# Patient Record
Sex: Male | Born: 1935
Health system: Southern US, Community
[De-identification: ages and names within clinical notes are randomized; demographics above are authoritative.]

## PROBLEM LIST (undated history)

## (undated) DIAGNOSIS — J302 Other seasonal allergic rhinitis: Secondary | ICD-10-CM

## (undated) DIAGNOSIS — J439 Emphysema, unspecified: Secondary | ICD-10-CM

## (undated) DIAGNOSIS — K219 Gastro-esophageal reflux disease without esophagitis: Secondary | ICD-10-CM

## (undated) DIAGNOSIS — J449 Chronic obstructive pulmonary disease, unspecified: Secondary | ICD-10-CM

## (undated) HISTORY — PX: CARDIAC CATHETERIZATION: SHX172

## (undated) HISTORY — PX: PROSTATE SURGERY: SHX751

---

## 1999-01-12 ENCOUNTER — Ambulatory Visit (HOSPITAL_COMMUNITY): Admission: RE | Admit: 1999-01-12 | Discharge: 1999-01-12 | Payer: Self-pay | Admitting: Cardiology

## 1999-05-18 ENCOUNTER — Encounter: Payer: Self-pay | Admitting: Internal Medicine

## 1999-05-18 ENCOUNTER — Encounter: Admission: RE | Admit: 1999-05-18 | Discharge: 1999-05-18 | Payer: Self-pay | Admitting: Internal Medicine

## 2001-01-03 ENCOUNTER — Emergency Department (HOSPITAL_COMMUNITY): Admission: EM | Admit: 2001-01-03 | Discharge: 2001-01-03 | Payer: Self-pay | Admitting: *Deleted

## 2001-12-20 ENCOUNTER — Encounter: Admission: RE | Admit: 2001-12-20 | Discharge: 2001-12-20 | Payer: Self-pay | Admitting: Internal Medicine

## 2001-12-20 ENCOUNTER — Encounter: Payer: Self-pay | Admitting: Internal Medicine

## 2002-02-12 ENCOUNTER — Ambulatory Visit (HOSPITAL_COMMUNITY): Admission: RE | Admit: 2002-02-12 | Discharge: 2002-02-12 | Payer: Self-pay | Admitting: Gastroenterology

## 2002-02-12 ENCOUNTER — Encounter (INDEPENDENT_AMBULATORY_CARE_PROVIDER_SITE_OTHER): Payer: Self-pay | Admitting: Specialist

## 2003-03-03 ENCOUNTER — Inpatient Hospital Stay (HOSPITAL_COMMUNITY): Admission: RE | Admit: 2003-03-03 | Discharge: 2003-03-05 | Payer: Self-pay | Admitting: Urology

## 2003-09-25 ENCOUNTER — Emergency Department (HOSPITAL_COMMUNITY): Admission: EM | Admit: 2003-09-25 | Discharge: 2003-09-25 | Payer: Self-pay | Admitting: Family Medicine

## 2003-09-28 ENCOUNTER — Emergency Department (HOSPITAL_COMMUNITY): Admission: EM | Admit: 2003-09-28 | Discharge: 2003-09-28 | Payer: Self-pay | Admitting: Family Medicine

## 2003-10-03 ENCOUNTER — Emergency Department (HOSPITAL_COMMUNITY): Admission: EM | Admit: 2003-10-03 | Discharge: 2003-10-03 | Payer: Self-pay | Admitting: Family Medicine

## 2004-04-19 ENCOUNTER — Encounter (INDEPENDENT_AMBULATORY_CARE_PROVIDER_SITE_OTHER): Payer: Self-pay | Admitting: *Deleted

## 2004-04-19 ENCOUNTER — Ambulatory Visit (HOSPITAL_COMMUNITY): Admission: RE | Admit: 2004-04-19 | Discharge: 2004-04-19 | Payer: Self-pay | Admitting: Gastroenterology

## 2004-09-13 ENCOUNTER — Ambulatory Visit (HOSPITAL_COMMUNITY): Admission: RE | Admit: 2004-09-13 | Discharge: 2004-09-13 | Payer: Self-pay | Admitting: Urology

## 2006-02-06 ENCOUNTER — Ambulatory Visit: Payer: Self-pay | Admitting: Pulmonary Disease

## 2008-06-16 ENCOUNTER — Ambulatory Visit (HOSPITAL_COMMUNITY): Admission: RE | Admit: 2008-06-16 | Discharge: 2008-06-16 | Payer: Self-pay | Admitting: Internal Medicine

## 2009-04-30 ENCOUNTER — Ambulatory Visit: Payer: Self-pay | Admitting: Internal Medicine

## 2009-04-30 ENCOUNTER — Inpatient Hospital Stay (HOSPITAL_COMMUNITY): Admission: EM | Admit: 2009-04-30 | Discharge: 2009-05-01 | Payer: Self-pay | Admitting: Emergency Medicine

## 2010-04-19 NOTE — Assessment & Plan Note (Signed)
Summary: FINGER CUT ALMOST COMPLETELY OFF / LFW   History of Present Illness: Not a patient here walked in  right index finge cut to bone hanging at angle but attached no sig active bleeding  Rescue called for transport for emergency care He was going to drive himself but I explained that would be entirely inappropriate and potentially dangerous   Other Orders: No Charge Patient Arrived (NCPA0) (NCPA0)

## 2010-06-08 LAB — POCT I-STAT, CHEM 8
BUN: 16 mg/dL (ref 6–23)
Calcium, Ion: 0.96 mmol/L — ABNORMAL LOW (ref 1.12–1.32)
HCT: 45 % (ref 39.0–52.0)
Potassium: 5.5 mEq/L — ABNORMAL HIGH (ref 3.5–5.1)
Sodium: 137 mEq/L (ref 135–145)
TCO2: 27 mmol/L (ref 0–100)

## 2010-06-08 LAB — CBC
Hemoglobin: 15.1 g/dL (ref 13.0–17.0)
MCHC: 33.6 g/dL (ref 30.0–36.0)
MCV: 91.8 fL (ref 78.0–100.0)
Platelets: 215 10*3/uL (ref 150–400)
RBC: 4.88 MIL/uL (ref 4.22–5.81)

## 2010-06-08 LAB — DIFFERENTIAL
Basophils Absolute: 0 10*3/uL (ref 0.0–0.1)
Basophils Relative: 1 % (ref 0–1)
Eosinophils Relative: 4 % (ref 0–5)
Lymphocytes Relative: 17 % (ref 12–46)
Lymphs Abs: 0.9 10*3/uL (ref 0.7–4.0)
Monocytes Absolute: 0.4 10*3/uL (ref 0.1–1.0)
Monocytes Relative: 7 % (ref 3–12)
Neutro Abs: 3.9 10*3/uL (ref 1.7–7.7)
Neutrophils Relative %: 72 % (ref 43–77)

## 2010-08-05 NOTE — H&P (Signed)
NAME:  Anthony Ball, Anthony Ball                           ACCOUNT NO.:  [id number]   MEDICAL RECORD NO.:  [id number]                   PATIENT TYPE:  AMB   LOCATION:  DAY                                  FACILITY:  [facility]   PHYSICIAN:  Ky Barban, M.D.            DATE OF BIRTH:  [birth year]   DATE OF ADMISSION:  DATE OF DISCHARGE:                                HISTORY & PHYSICAL   CHIEF COMPLAINT:  Symptoms of prostatism.   HISTORY:  A 76 year old gentleman, initially seen by me on October 18, with  mild symptoms of prostatism, slow and weak stream, nocturia x 3, hesitancy,  sometimes has dysuria, no fever, chills, or gross hematuria.  He is taking  Flomax but still has symptoms.  Work-up showed that he has enlarged prostate  causing bladder neck obstruction, so I have told him to undergo TUR of  prostate.  The procedure, its complication, limitations explained.  He  understood.  We are going to go ahead and proceed.  He also complains of  pain in his right testicle, and ultrasound showed that he has right  spermatocele.  There is also one on the left side which is smaller.   PAST MEDICAL HISTORY:  1. Bilateral inguinal hernia repair.  2. Removal of nasal polyp.   FAMILY HISTORY:  No history of prostate cancer.   SOCIAL HISTORY:  Does not smoke or drink.   REVIEW OF SYSTEMS:  Unremarkable.   PHYSICAL EXAMINATION:  VITAL SIGNS:  Blood pressure 110/68, temperature  97.8.  CENTRAL NERVOUS SYSTEM:  No gross neurologic deficit.  HEAD AND NECK:  Negative.  CHEST:  Clear.  HEART:  Regular sinus rhythm.  ABDOMEN:  Soft, flat.  Liver, spleen, kidneys are not palpable.  No CVA  tenderness.  GENITOURINARY:  Uncircumcised meatus, __________ testicles feel normal.  There is a cystic swelling 3 cm in size on the right side which is slightly  tender.  There is a spermatocele  or hydrocele.  RECTAL EXAM:  Normal sphincter tone.  No rectal mass.  Prostate 1.5+,  smooth, and firm.   IMPRESSION:  1. Benign prostatic hypertrophy.  2. Small right spermatocele.   PLAN:  Transurethral resection of prostate under anesthesia as outpatient,  and then he will be admitted in the hospital.                                                Ky Barban, M.D.    MIJ/MEDQ  D:  03/02/2003  T:  03/02/2003  Job:  [id number]

## 2010-08-05 NOTE — Op Note (Signed)
   NAME:  Anthony Ball, Anthony Ball                           ACCOUNT NO.:  [id number]   MEDICAL RECORD NO.:  [id number]                   PATIENT TYPE:  AMB   LOCATION:  ENDO                                 FACILITY:  [facility]   PHYSICIAN:  Danise Edge, M.D.                DATE OF BIRTH:  [birth year]   DATE OF PROCEDURE:  02/12/2002  DATE OF DISCHARGE:                                 OPERATIVE REPORT   PROCEDURE:  Colonoscopy and polypectomy.   INDICATIONS:  The patient is a 76 year old male undergoing his first  screening colonoscopy with polypectomy to prevent colon cancer.  I discussed  with the patient the complications associated with colonoscopy and  polypectomy, including a 15 per thousand risk of bleeding and one per  thousand risk of colon perforation requiring surgery.  The patient has  signed the operative permit.   ENDOSCOPIST:  Danise Edge, M.D.   PREMEDICATION:  Versed 7.5 mg, Demerol 50 mg.   ENDOSCOPE:  Olympus pediatric colonoscope.   DESCRIPTION OF PROCEDURE:  After obtaining informed consent, the patient was  placed in the left lateral decubitus position.  I administered intravenous  Demerol and intravenous Versed to achieve conscious sedation for the  procedure.  The patient's blood pressure, oxygen saturation, and cardiac  rhythm were monitored throughout the procedure and documented in the medical  record.   Anal inspection was normal.  Digital rectal exam was normal.  The Olympus  pediatric video colonoscope was introduced into the rectum and advanced to  the cecum.  Colonic preparation for the exam today was satisfactory.   Rectum normal.   Sigmoid colon and descending colon:  Left colonic diverticulosis.  At  approximately 30-40 cm from the anal verge, three 1 mm sessile polyps were  removed with the electrocautery snare and submitted for pathologic  interpretation.   Splenic flexure normal.   Transverse colon normal.   Hepatic flexure normal.   Ascending colon normal.   Cecum and ileocecal valve normal.    ASSESSMENT:  1. Left colonic diverticulosis.  2. Three small polyps were removed from the mid-distal sigmoid colon.   RECOMMENDATIONS:  Repeat colonoscopy in approximately five years.                                               Danise Edge, M.D.    MJ/MEDQ  D:  02/12/2002  T:  02/12/2002  Job:  [id number]   cc:   Georgann Housekeeper, M.D.  [address]  [place]  [place] [address]  Fax: [phone]

## 2010-08-05 NOTE — Op Note (Signed)
NAMESISTO, GRANILLO                 ACCOUNT NO.:  [id number]   MEDICAL RECORD NO.:  [id number]          PATIENT TYPE:  AMB   LOCATION:  DAY                           FACILITY:  [facility]   PHYSICIAN:  Ky Barban, M.D.DATE OF BIRTH:  Sep 10, 1934   DATE OF PROCEDURE:  09/13/2004  DATE OF DISCHARGE:                                 OPERATIVE REPORT   PREOPERATIVE DIAGNOSIS:  Urethral stricture.   POSTOPERATIVE DIAGNOSIS:  Urethral stricture.   PROCEDURE:  Retrograde urethrogram, cysto, holmium laser ablation of the  stricture.   ANESTHESIA:  Spinal.   PROCEDURE:  The patient given spinal anesthesia. Placed in lithotomy  position. After usual prep and drape, a solution of KY jelly and Hypaque was  injected into the urethra after holding the glans penis with the Brodney  clamp. Under fluoroscopic control, I can see the dye goes into the bulbar  urethra. That is where the narrow stricture is. The dye goes through it into  the bladder. The rest of the anterior urethra looks normal. At this point,  clamp was removed, and #25 cystoscope introduced under direct vision. The  stricture is visualized, and I was able to pass #5 ________________catheter  through it. The catheter was left in place, and I removed the cystoscope,  and along side the catheter introduced short rigid laser urethroscope, and  through that, I incised the stricture at 6 and 12 o'clock positions. Then  the prostatic urethra and the bladder were inspected and looked fine.  Urethroscope was removed, and urethra further dilated to 21 [place] with [employer] sounds. A #16 silicone catheter is left in for drainage. The urethra  catheter was removed. The patient left the operating room in satisfactory  condition.       MIJ/MEDQ  D:  09/13/2004  T:  09/13/2004  Job:  [id number]

## 2010-08-05 NOTE — Assessment & Plan Note (Signed)
[facility]                             PULMONARY OFFICE NOTE   Anthony Ball, Anthony Ball                        MRN:          [id number]  DATE:02/06/2006                            DOB:          February 14, 1935    PULMONARY SELF-REFERRAL   HISTORY OF PRESENT ILLNESS:  The patient is a 76 year old white male who  comes in today for evaluation of possible emphysema.  The patient had a  recent chest x-ray with his primary care physician and told he had  changes consistent with emphysema.  He is very concerned about this  and wants to know if this is an issue that is going to be a problem for  him in later years.  The patient states that he had minimal tobacco use  during his teenage years and has not smoked since.  In terms of his  breathing status, he states that he has no difficulties walking on flat  ground at a moderate pace.  He will not get short of breath making a bed  or bringing groceries in from the car.  Overall, he has minimal dyspnea  on exertion and fairly good exercise tolerance.  He does complain of  some chronic nasal congestion at times and has a history of allergy  shots, which have helped in the past.  The patient's weight has not  changed significantly over the last few years.   PAST MEDICAL HISTORY:  1. Significant for allergic rhinitis.  2. History of sinus and colon surgery in the past.   The patient takes no medications on a regular basis and has no known  drug allergies.   SOCIAL HISTORY:  He is divorced and has children.  He currently is  retired and lives alone.   FAMILY HISTORY:  Remarkable for possible emphysema.  Otherwise, is  noncontributory.   REVIEW OF SYSTEMS:  As per history of present illness.  Also see the  patient intake form documented in the chart.   PHYSICAL EXAM:  GENERAL:  He is a well-developed white male in no acute  distress.  Blood pressure 110/54, pulse 67, temperature 98.2, weight is 145 pounds,  O2  saturation on room air is 99%.  HEENT:  Pupils are equal, round, and reactive to light and  accommodation.  Extraocular muscles are intact.  Nares show mild  deviation to the left of the septum, but patency of both nasal airways.  Oropharynx is clear.  NECK:  Supple without JVD or lymphadenopathy.  There is no palpable  thyromegaly.  CHEST:  Totally clear.  CARDIAC:  Regular rate and rhythm with a 1/6 systolic murmur.  ABDOMEN:  Soft and nontender with good bowel sounds.  GENITAL, RECTAL, AND BREASTS:  Not done and not indicated.  LOWER EXTREMITIES:  Without edema.  Pulses are intact distally.  NEUROLOGIC:  He is alert and oriented with no observable motor defects.   LABORATORY DATA:  Spirometry done today in the office was very difficult  because of the patient's problems with following directions.  His  technique was less than perfect.  His forced  vital capacity is obviously  inaccurate with it being 149% of predicted.  There is no evidence for  gross airflow obstruction by the numbers or by the flow volume loop.   IMPRESSION:  1. No evidence for clinically significant obstructive lung disease at      this time.  The patient enjoys a fairly active life-style and good      exercise tolerance.  He has no significant airflow obstruction by      spirometry.  I have tried to explain to him the difference between      mild emphysematous changes on chest x-ray and how it translates      into real-life abnormalities.  The findings on the x-ray may simply      be due to the natural effects of aging.  The patient, at this time,      is clinically asymptomatic.  2. Allergic rhinitis with chronic nasal congestion.  He may benefit      form a trial of topical nasal corticosteroids if he has not tried      these.   PLAN:  1. I have asked the patient to keep up with his flu vaccine and      Pneumovax, as well as staying on a regular exercise program.  2. No further pulmonary followup is  necessary unless he has      significant increase in symptoms.  3. Trial of Veramyst 2 sprays in each nostril daily to see if this      will help his allergic rhinitis symptoms.  4. The patient will follow up on a p.r.n. basis.     Barbaraann Share, MD,FCCP  Electronically Signed    [facility]/MedQ  DD: 02/06/2006  DT: 02/06/2006  Job #: [id number]   cc:   Georgann Housekeeper, MD

## 2010-08-05 NOTE — Op Note (Signed)
NAME:  DILLION, STOWERS NO.:  [id number]   MEDICAL RECORD NO.:  [id number]          PATIENT TYPE:  AMB   LOCATION:  ENDO                         FACILITY:  [facility]   PHYSICIAN:  Danise Edge, M.D.   DATE OF BIRTH:  Nov 23, 1934   DATE OF PROCEDURE:  04/19/2004  DATE OF DISCHARGE:                                 OPERATIVE REPORT   PROCEDURE:  Esophagogastroduodenoscopy.   INDICATIONS:  Mr. Radford Pease is a 76 year old male born 14-Oct-1934.  Mr. Kron has unexplained epigastric discomfort.   ENDOSCOPIST:  Danise Edge, M.D.   PREMEDICATION:  Versed 5 mg, Demerol 50 mg.   DESCRIPTION OF PROCEDURE:  After obtaining informed consent, Mr. Zapf was  placed on the left lateral decubitus position.  I administered intravenous  Demerol and intravenous Versed to achieve conscious sedation for the  procedure.  Mr. Febus blood pressure, oxygen saturation and cardiac rhythm  were monitored throughout the procedure and documented in the medical  record.   The Olympus gastroscope was passed through the posterior hypopharynx into  the proximal esophagus without difficulty.  The hypopharynx, larynx and  vocal cords appeared normal.   Esophagoscopy:  The proximal, mid and lower segments of the esophageal  mucosa appear normal.  There is no endoscopic evidence for the presence of  Barrett's esophagus or erosive esophagitis.   Gastroscopy:  Retroflexed view of the gastric fundus was normal.  There is a  1-2 mm white mucosal scar in the gastric cardia which was biopsied.  The  gastric body, antrum and pylorus appear normal.   Duodenoscopy:  The duodenal bulb, mid duodenum  and distal duodenum appear  normal.   ASSESSMENT:  Essentially normal esophagogastroduodenoscopy.  A small white  mucosal scar-type lesion was biopsied from the gastric cardiac.      MJ/MEDQ  D:  04/19/2004  T:  04/19/2004  Job:  [id number]   cc:   Georgann Housekeeper, MD  [address]  [place]  [place] [address]  Fax: [phone]

## 2010-08-05 NOTE — Discharge Summary (Signed)
NAME:  LUSTER, HECHLER NO.:  [id number]   MEDICAL RECORD NO.:  [id number]                   PATIENT TYPE:  INP   LOCATION:                                       FACILITY:  [facility]   PHYSICIAN:  Ky Barban, M.D.            DATE OF BIRTH:  05/19/1934   DATE OF ADMISSION:  03/03/2003  DATE OF DISCHARGE:  03/05/2003                                 DISCHARGE SUMMARY   This 76 year old gentleman is brought in as an outpatient to undergo TUR of  prostate.  He is having quite a bit of symptoms of prostatism and workup in  the office has shown that he has enlarged prostate.  I have advised him to  undergo TUR of prostate for which he was briefed; the procedure,  complications, and limitations discussed.  He agreed so he was brought as an  outpatient on December 14 to undergo TUR prostate.   HOSPITAL COURSE:  Preoperative workup:  CBC, urinalysis, MET-7, EKG were all  negative.  He was taken to the OR and TUR of prostate was done.  Postoperative course was benign.  First postop day he was afebrile.  Abdomen  was soft.  Urine was clear; so discontinued his CBA, placed him on a regular  diet.  Second postop day urine was clear.  He was afebrile, had bowel  movement.  Pathology report is back.  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  No tumor so his Foley catheter was  discontinued; he is voiding fine.  At this point he is being discharged.   FINAL DISCHARGE DIAGNOSIS:  Benign prostatic hypertrophy.   DISCHARGE CONDITION:  Improved.   DISCHARGE MEDICATIONS:  None.   DISCHARGE INSTRUCTIONS:  I will see him back in the office in 2 weeks.                                                Ky Barban, M.D.    MIJ/MEDQ  D:  03/17/2003  T:  03/17/2003  Job:  [id number]

## 2010-08-05 NOTE — H&P (Signed)
NAME:  Anthony Ball, Anthony Ball NO.:  [id number]   MEDICAL RECORD NO.:  [id number]         PATIENT TYPE:  PAMB   LOCATION:                                FACILITY:  [facility]   PHYSICIAN:  Ky Barban, M.D.    DATE OF BIRTH:   DATE OF ADMISSION:  09/13/2004  DATE OF DISCHARGE:  LH                                HISTORY & PHYSICAL   CHIEF COMPLAINT:  Difficulty to void.   HISTORY OF PRESENT ILLNESS:  A 76 year old gentleman a couple of years ago  underwent TUR of the prostate. He was doing real good.  He started to  complain of difficulty voiding, urgency, frequency. Cystoscopy was done and  he was found to have stricture and there was bulbar urethra. Trying to  dilate him, I was only able to dilate him partially.  So I advised him to  come as an outpatient so I can do an optical urethrotomy.  I have told him  about the procedure and he will have a Foley catheter when he goes home with  that.  He is coming as an outpatient tomorrow to have it done.   PAST MEDICAL HISTORY:  1.  Bilateral inguinal hernia repair.  2.  Removal of nasal polyp.  3.  Transurethral resection of the prostate a couple years ago.   SOCIAL HISTORY:  Does not smoke or drink.   REVIEW OF SYSTEMS:  Unremarkable.   PHYSICAL EXAMINATION:  VITAL SIGNS:  Blood pressure 120/80, temperature  normal.  CENTRAL NERVOUS SYSTEM:  Negative.  HEAD AND NECK:  Negative.  CHEST:  Symmetrical.  HEART:  Regular sinus rhythm.  No murmur.  ABDOMEN:  Soft, flat.  Liver, spleen, and kidneys not palpable.  No CVA  tenderness.  EXTERNAL GENITALIA:  Unremarkable.  RECTAL:  __________ normal.   IMPRESSION:  Uretheral stricture.   PLAN:  Retrograde urethrogram, cystoscopy, optical urethrotomy under  anesthesia as an outpatient.       MIJ/MEDQ  D:  09/12/2004  T:  09/12/2004  Job:  [id number]

## 2010-08-05 NOTE — H&P (Signed)
NAME:  Ball, Anthony NO.:  [id number]   MEDICAL RECORD NO.:  [id number]           PATIENT TYPE:   LOCATION:                                 FACILITY:   PHYSICIAN:  Ky Barban, M.D.    DATE OF BIRTH:   DATE OF ADMISSION:  DATE OF DISCHARGE:  LH                                HISTORY & PHYSICAL   CHIEF COMPLAINT:  Ureteral stricture.   A 76 year old gentleman a couple of years ago underwent TUR prostate for  BPH.  He was doing fine.  Lately, he was complaining the urinary stream has  slowed down.  Cystoscopy showed that he has a stricture in the bulbar  urethra.  I tried to dilate him, but unable to completely dilate him under  local anesthesia in the office, so I decided to go ahead and do an optical  urethrotomy under anesthesia in the hospital.   PROCEDURE:  The limitations, complications as described.  He understands.  He is come in as an outpatient and will undergo the procedure as an  outpatient.   PAST MEDICAL HISTORY:  1.  TUR prostate, 2004.  2.  Bilateral inguinal hernia repair.  3.  Removal of nasal polyp.   FAMILY HISTORY:  No history of prostate cancer.   PERSONAL HISTORY:  Does not smoke or drink.   REVIEW OF SYSTEMS:  Unremarkable.   PHYSICAL EXAMINATION:  GENERAL:  A well-developed, well-nourished in no  acute distress.  VITAL SIGNS:  Blood pressure 120/70, temperature is normal.  CENTRAL NERVOUS SYSTEM:  Negative.  HEAD AND NECK:  ENT negative.  CHEST:  Heart regular sinus rhythm.  ABDOMEN:  Soft, flat.  Liver, spleen, kidneys not felt.  There was no CVA  tenderness.  EXTERNAL GENITALIA:  Circumcised male.  Testicles are normal.  RECTAL:  Deferred.  EXTREMITIES:  Normal.   IMPRESSION:  Urethral stricture.   PLAN:  Optical urethrotomy under anesthesia as an outpatient.       MIJ/MEDQ  D:  09/05/2004  T:  09/05/2004  Job:  [id number]

## 2010-08-05 NOTE — Op Note (Signed)
NAME:  Anthony Ball, Anthony Ball                           ACCOUNT NO.:  [id number]   MEDICAL RECORD NO.:  [id number]                   PATIENT TYPE:  INP   LOCATION:  A220                                 FACILITY:  [facility]   PHYSICIAN:  Ky Barban, M.D.            DATE OF BIRTH:  1935-02-17   DATE OF PROCEDURE:  03/03/2003  DATE OF DISCHARGE:                                 OPERATIVE REPORT   PREOPERATIVE DIAGNOSIS:  Benign prostatic hypertrophy.   POSTOPERATIVE DIAGNOSIS:  Benign prostatic hypertrophy.   PROCEDURE:  Transurethral resection of prostate.   SURGEON:  Ky Barban, M.D.   ANESTHESIA:  Spinal.   DESCRIPTION OF PROCEDURE:  The patient was given spinal anesthesia, placed  in the lithotomy position, appropriately prepped and draped.  A #28 Iglesias  resectoscope was introduced into the bladder.  The bladder was inspected.  He has a rather large median lobe.  The resectoscope was pulled back in the  mid prostatic urethra.  The median lobe was resected completely to the level  of the verumontanum.  Now the bladder neck was circumferentially dissected.  The bleeders were coagulated.  A resectoscope was pulled back at the level  of the verumontanum and rotated to the 11 o'clock position.  A dissection of  the right lobe was done between the 11 and 7 o'clock position.  Similarly,  the left lobe was dissected between the 1 and 5 o'clock positions.  There  was a small amount of fissure in the anterior midline.  Most of the  __________, the chips were evacuated.  Bleeders were coagulated.  The  prostatic urethra tunnel looks wide open.  The resectoscope was then  removed, and a 23 Foley catheter was left in for drainage which is clear.  The patient left the operating room in satisfactory condition.                                               Ky Barban, M.D.    MIJ/MEDQ  D:  03/03/2003  T:  03/03/2003  Job:  [id number]

## 2011-08-24 ENCOUNTER — Other Ambulatory Visit: Payer: Self-pay | Admitting: Gastroenterology

## 2012-04-13 ENCOUNTER — Emergency Department (HOSPITAL_COMMUNITY)
Admission: EM | Admit: 2012-04-13 | Discharge: 2012-04-13 | Disposition: A | Discharge status: Home / Self Care | Payer: Medicare Other | Attending: Emergency Medicine | Admitting: Emergency Medicine

## 2012-04-13 ENCOUNTER — Emergency Department (HOSPITAL_COMMUNITY): Payer: Medicare Other

## 2012-04-13 ENCOUNTER — Encounter (HOSPITAL_COMMUNITY): Payer: Self-pay | Admitting: *Deleted

## 2012-04-13 DIAGNOSIS — J4489 Other specified chronic obstructive pulmonary disease: Secondary | ICD-10-CM | POA: Insufficient documentation

## 2012-04-13 DIAGNOSIS — R0602 Shortness of breath: Secondary | ICD-10-CM | POA: Insufficient documentation

## 2012-04-13 DIAGNOSIS — J449 Chronic obstructive pulmonary disease, unspecified: Secondary | ICD-10-CM | POA: Insufficient documentation

## 2012-04-13 DIAGNOSIS — Z79899 Other long term (current) drug therapy: Secondary | ICD-10-CM | POA: Insufficient documentation

## 2012-04-13 HISTORY — DX: Chronic obstructive pulmonary disease, unspecified: J44.9

## 2012-04-13 LAB — CBC WITH DIFFERENTIAL/PLATELET
Basophils Absolute: 0 10*3/uL (ref 0.0–0.1)
MCHC: 33.6 g/dL (ref 30.0–36.0)
MCV: 89.4 fL (ref 78.0–100.0)
Monocytes Absolute: 0.4 10*3/uL (ref 0.1–1.0)
Neutro Abs: 2.2 10*3/uL (ref 1.7–7.7)
Neutrophils Relative %: 59 % (ref 43–77)

## 2012-04-13 LAB — BASIC METABOLIC PANEL
BUN: 14 mg/dL (ref 6–23)
CO2: 24 mEq/L (ref 19–32)
Calcium: 9.1 mg/dL (ref 8.4–10.5)
Chloride: 104 mEq/L (ref 96–112)
Creatinine, Ser: 0.76 mg/dL (ref 0.50–1.35)
GFR calc non Af Amer: 87 mL/min — ABNORMAL LOW (ref >90)
Glucose, Bld: 82 mg/dL (ref 70–99)
Potassium: 4.5 mEq/L (ref 3.5–5.1)
Sodium: 137 mEq/L (ref 135–145)

## 2012-04-13 LAB — TROPONIN I: Troponin I: <0.3 ng/mL (ref <0.30)

## 2012-04-13 MED ORDER — ALBUTEROL SULFATE HFA 108 (90 BASE) MCG/ACT IN AERS: 2.0000 | INHALATION_SPRAY | RESPIRATORY_TRACT | Status: DC | PRN

## 2012-04-13 NOTE — ED Provider Notes (Signed)
History     CSN: [id number]  Arrival date & time 04/13/12  1111   First MD Initiated Contact with Patient 04/13/12 1126      Chief Complaint  Patient presents with  . Shortness of Breath    (Consider location/radiation/quality/duration/timing/severity/associated sxs/prior treatment) HPI Comments: 78 year old male who has a history of very few medical problems who presents with shortness of breath. He states that the shortness of breath has been slightly worsening over the last 2 days, slightly more at night. He states that he maybe has occasional abnormal feeling in his chest but denies upright chest pain or palpitations. He denies swelling in his legs, denies orthopnea, denies paroxysmal nocturnal dyspnea. He has been evaluated by pulmonology approximately 6 years ago and was found to have no evidence of COPD and at that time no further workup was indicated. The patient seems to think that he does have some emphysema though he has not smoked since he was a teenager. The patient denies any coughing, fever, chills, nausea, vomiting or any other complaints. He states that his symptoms are mild and they have gradually been improving this morning. He does not use oxygen at home and has no dyspnea on exertion.  Patient is a 78 y.o. male presenting with shortness of breath. The history is provided by the patient and medical records.  Shortness of Breath  Associated symptoms include shortness of breath.    Past Medical History  Diagnosis Date  . COPD (chronic obstructive pulmonary disease)     No past surgical history on file.  No family history on file.  History  Substance Use Topics  . Smoking status: Never Smoker   . Smokeless tobacco: Not on file  . Alcohol Use: No      Review of Systems  Respiratory: Positive for shortness of breath.   All other systems reviewed and are negative.    Allergies  Review of patient's allergies indicates no known allergies.  Home Medications    Current Outpatient Rx  Name  Route  Sig  Dispense  Refill  . LORATADINE 10 MG PO TABS   Oral   Take 10 mg by mouth daily.         Marland Kitchen OMEPRAZOLE 20 MG PO CPDR   Oral   Take 20 mg by mouth 2 (two) times daily.          . TRIAMCINOLONE ACETONIDE 0.1 % EX CREA   Topical   Apply 1 application topically every other day. Apply to great areas of body         . ALBUTEROL SULFATE HFA 108 (90 BASE) MCG/ACT IN AERS   Inhalation   Inhale 2 puffs into the lungs every 4 (four) hours as needed for wheezing or shortness of breath.   1 Inhaler   3     BP 102/64  Pulse 67  Temp 97.2 F (36.2 C) (Oral)  Resp 17  SpO2 97%  Physical Exam  Nursing note and vitals reviewed. Constitutional: He appears well-developed and well-nourished. No distress.  HENT:  Head: Normocephalic and atraumatic.  Mouth/Throat: Oropharynx is clear and moist. No oropharyngeal exudate.       Oropharynx is clear, mucous membranes are moist  Eyes: Conjunctivae normal and EOM are normal. Pupils are equal, round, and reactive to light. Right eye exhibits no discharge. Left eye exhibits no discharge. No scleral icterus.  Neck: Normal range of motion. Neck supple. No JVD present. No thyromegaly present.  Cardiovascular: Normal rate, regular rhythm,  normal heart sounds and intact distal pulses.  Exam reveals no gallop and no friction rub.   No murmur heard.      Normal pulses at the radial arteries bilaterally, no signs of JVD  Pulmonary/Chest: Effort normal and breath sounds normal. No respiratory distress. He has no wheezes. He has no rales.       Lung exam with no abnormal sounds, totally clear, no respiratory distress, no increased work of breathing, speaks in full sentences, no wheezing rales or rhonchi.  Abdominal: Soft. Bowel sounds are normal. He exhibits no distension and no mass. There is no tenderness.  Musculoskeletal: Normal range of motion. He exhibits no edema and no tenderness.       No peripheral  edema, normal range of motion of lower extremities at the hips knees and ankles  Lymphadenopathy:    He has no cervical adenopathy.  Neurological: He is alert. Coordination normal.       Sensation intact to the bilateral lower and upper extremities to light touch. Gait is steady  Skin: Skin is warm and dry. No rash noted. No erythema.  Psychiatric: He has a normal mood and affect. His behavior is normal.    ED Course  Procedures (including critical care time)  Labs Reviewed  CBC WITH DIFFERENTIAL - Abnormal; Notable for the following:    WBC 3.7 (*)     All other components within normal limits  BASIC METABOLIC PANEL - Abnormal; Notable for the following:    GFR calc non Af Amer 87 (*)     All other components within normal limits  TROPONIN I  LAB REPORT - SCANNED   Dg Chest 2 View  04/13/2012  *RADIOLOGY REPORT*  Clinical Data: COPD exacerbation with shortness of breath.  CHEST - 2 VIEW  Comparison: Portable chest x-ray 04/30/2009.  Findings: Cardiomediastinal silhouette unremarkable, unchanged. Mild hyperinflation and mildly prominent bronchovascular markings diffusely, unchanged.  Nipple shadows project over the lung bases on the PA image, as before.  Lungs clear.  Bronchovascular markings normal.  Pulmonary vascularity normal.  No pneumothorax.  No pleural effusions.  IMPRESSION: Mild hyperinflation consistent with COPD and/or asthma.  No acute cardiopulmonary disease.  Nipple shadows project over the lung bases, as before.   Original Report Authenticated By: Hulan Saas, M.D.      1. Shortness of breath       MDM  The etiology of the patient's shortness of breath is unclear on his physical exam as he does appear well without any abnormal findings. We'll proceed with EKG and chest x-ray with some laboratory values to further evaluate.  Filed Vitals:   04/13/12 1550  BP: 102/64  Pulse: 67  Temp:   Resp: 17    ED ECG REPORT  I personally interpreted this EKG   Date:  04/15/2012   Rate: 81  Rhythm: normal sinus rhythm  QRS Axis: normal  Intervals: normal  ST/T Wave abnormalities: normal  Conduction Disutrbances:none  Narrative Interpretation:   Old EKG Reviewed: Compared with the more 11th 2011, no significant changes  Pt feels much better - he has received no medicines - has normal labs and ECG and has clear lungs on the CXR.  Normal trophonin - offered pt MDI for home - accepted, normal sat's, and repeat exam has normal lung sounds - pt can be reffered back to PMD Dr. Eula Listen.  PA and lateral views of the chest were obtained by digital radiography. I have personally interpreted these x-rays and find her  to be no signs of pulmonary infiltrate, cardiomegaly, subdiaphragmatic free air, soft tissue abnormality, no obvious bony abnormalities or fractures.  There is some hyperexpansion of the lungs.  Pt has clear lungs on reexamination and states that he has been asymptomatic throughout his stay in the ED.  Labs reviewed, VS remain normal, pt stable for d/c.      Vida Roller, MD 04/15/12 1130

## 2012-04-13 NOTE — ED Notes (Signed)
Patient transported to X-ray 

## 2012-04-13 NOTE — ED Notes (Signed)
Has history of COPD, last two nights increased difficulty breathing due to [employer]

## 2012-04-13 NOTE — ED Notes (Signed)
Rn to obtain labs with start of IV 

## 2012-07-21 ENCOUNTER — Emergency Department (HOSPITAL_COMMUNITY): Payer: Medicare Other

## 2012-07-21 ENCOUNTER — Emergency Department (HOSPITAL_COMMUNITY)
Admission: EM | Admit: 2012-07-21 | Discharge: 2012-07-21 | Disposition: A | Discharge status: Home / Self Care | Payer: Medicare Other | Attending: Emergency Medicine | Admitting: Emergency Medicine

## 2012-07-21 ENCOUNTER — Encounter (HOSPITAL_COMMUNITY): Payer: Self-pay

## 2012-07-21 DIAGNOSIS — M25559 Pain in unspecified hip: Secondary | ICD-10-CM | POA: Insufficient documentation

## 2012-07-21 DIAGNOSIS — Z79899 Other long term (current) drug therapy: Secondary | ICD-10-CM | POA: Insufficient documentation

## 2012-07-21 DIAGNOSIS — J438 Other emphysema: Secondary | ICD-10-CM | POA: Insufficient documentation

## 2012-07-21 DIAGNOSIS — Z9889 Other specified postprocedural states: Secondary | ICD-10-CM | POA: Insufficient documentation

## 2012-07-21 DIAGNOSIS — K219 Gastro-esophageal reflux disease without esophagitis: Secondary | ICD-10-CM | POA: Insufficient documentation

## 2012-07-21 DIAGNOSIS — M25551 Pain in right hip: Secondary | ICD-10-CM

## 2012-07-21 HISTORY — DX: Gastro-esophageal reflux disease without esophagitis: K21.9

## 2012-07-21 HISTORY — DX: Other seasonal allergic rhinitis: J30.2

## 2012-07-21 HISTORY — DX: Emphysema, unspecified: J43.9

## 2012-07-21 MED ORDER — ONDANSETRON 4 MG PO TBDP
4.0000 mg | ORAL_TABLET | Freq: Once | ORAL | Status: AC
  Administered 2012-07-21: 4 mg via ORAL
  Filled 2012-07-21: qty 1

## 2012-07-21 MED ORDER — HYDROCODONE-ACETAMINOPHEN 5-325 MG PO TABS: ORAL_TABLET | ORAL | Status: DC

## 2012-07-21 MED ORDER — MORPHINE SULFATE 4 MG/ML IJ SOLN: 4.0000 mg | Freq: Once | INTRAMUSCULAR | Status: DC

## 2012-07-21 MED ORDER — MORPHINE SULFATE 4 MG/ML IJ SOLN
4.0000 mg | Freq: Once | INTRAMUSCULAR | Status: AC
  Administered 2012-07-21: 4 mg via INTRAMUSCULAR
  Filled 2012-07-21: qty 1

## 2012-07-21 NOTE — ED Provider Notes (Signed)
History     CSN: [id number]  Arrival date & time 07/21/12  1005   First MD Initiated Contact with Patient 07/21/12 1024      Chief Complaint  Patient presents with  . Hip Pain    (Consider location/radiation/quality/duration/timing/severity/associated sxs/prior treatment) HPI  Anthony Ball is a 78 y.o. male complaining of left hip pain onset yesterday when patient was lifting heavy pieces of metal junk. Patient states the pain has become more severe increasing to 9/10. Patient is accompanied by sister Anthony Ball who states that he crawled on his hands and knees to go to the bathroom last night. He is walking with a cane, he normally and lives independently. Patient states that the pain is centered at the left hip it initially radiated down to the foot, but this resolved. Patient denies any numbness paresthesia, change in bowel or bladder habits, head trauma, neck pain, chest pain, shortness of breath, abdominal pain.    Past Medical History  Diagnosis Date  . COPD (chronic obstructive pulmonary disease)   . Emphysema   . Seasonal allergies   . GERD (gastroesophageal reflux disease)     Past Surgical History  Procedure Laterality Date  . Prostate surgery    . Cardiac catheterization      No family history on file.  History  Substance Use Topics  . Smoking status: Never Smoker   . Smokeless tobacco: Not on file  . Alcohol Use: No      Review of Systems  Constitutional: Negative for fever.  Respiratory: Negative for shortness of breath.   Cardiovascular: Negative for chest pain.  Gastrointestinal: Negative for nausea, vomiting, abdominal pain and diarrhea.  Musculoskeletal: Positive for arthralgias.  All other systems reviewed and are negative.    Allergies  Review of patient's allergies indicates no known allergies.  Home Medications   Current Outpatient Rx  Name  Route  Sig  Dispense  Refill  . albuterol (PROVENTIL HFA;VENTOLIN HFA) 108 (90 BASE) MCG/ACT  inhaler   Inhalation   Inhale 2 puffs into the lungs every 4 (four) hours as needed for wheezing or shortness of breath.   1 Inhaler   3   . omeprazole (PRILOSEC) 20 MG capsule   Oral   Take 20 mg by mouth 2 (two) times daily.          Marland Kitchen triamcinolone cream (KENALOG) 0.1 %   Topical   Apply 1 application topically every other day. Apply to great areas of body           BP 115/66  Pulse 77  Temp(Src) 97.4 F (36.3 C) (Oral)  Resp 18  SpO2 96%  Physical Exam  Nursing note and vitals reviewed. Constitutional: He is oriented to person, place, and time. He appears well-developed and well-nourished. No distress.  HENT:  Head: Normocephalic.  Eyes: Conjunctivae and EOM are normal. Pupils are equal, round, and reactive to light.  Cardiovascular: Normal rate.   Pulmonary/Chest: Effort normal. No stridor.  Musculoskeletal: Normal range of motion.  Legs equal length, no abnormal rotation, patient is able to lift left and right leg up off the bed to greater than 45. Distal sensation is grossly intact, straight leg raise is negative bilaterally, patient is mildly tender to palpation at the right greater trochanter with no erythema, warmth or skin changes.  Neurological: He is alert and oriented to person, place, and time.  Psychiatric: He has a normal mood and affect.    ED Course  Procedures (including critical  care time)  Labs Reviewed - No data to display Dg Hip Complete Right  07/21/2012  *RADIOLOGY REPORT*  Clinical Data: Right hip pain, lifting injury  RIGHT HIP - COMPLETE 2+ VIEW  Comparison: None.  Findings: Bones appear osteopenic.  Degenerative changes of the lower lumbar spine with mild scoliosis.  Bony pelvis appears intact.  Hips are symmetric.  Right hip appears located without acute displaced fracture.  No subluxation or dislocation.  IMPRESSION: Osteopenia without acute fracture by plain radiography.   Original Report Authenticated By: Judie Petit. Shick, M.D.      1.  Arthralgia of hip, right       MDM   Anthony Ball is a 78 y.o. male x-ray shows no fracture, it is consistent with osteopenia. I will treat his pain and advised him to rest and follow with orthopedist if the pain persists.   Filed Vitals:   07/21/12 1011  BP: 115/66  Pulse: 77  Temp: 97.4 F (36.3 C)  TempSrc: Oral  Resp: 18  SpO2: 96%     VSS and patient is appropriate for, and amenable to, discharge at this time. Pt verbalized understanding and agrees with care plan. Outpatient follow-up and return precautions given.    New Prescriptions   HYDROCODONE-ACETAMINOPHEN (NORCO/VICODIN) 5-325 MG PER TABLET    Take 1-2 tablets by mouth every 6 hours as needed for pain.           Wynetta Emery, PA-C 07/21/12 1528

## 2012-07-21 NOTE — ED Notes (Signed)
Pt states he was moving some stuff around yesterday and the motion of lifting, twisting and bending is making his right hip and leg hurt.

## 2012-07-21 NOTE — ED Provider Notes (Signed)
Medical screening examination/treatment/procedure(s) were performed by non-physician practitioner and as supervising physician I was immediately available for consultation/collaboration.    Vanden Fawaz R Breasia Karges, MD 07/21/12 1554 

## 2014-06-26 ENCOUNTER — Other Ambulatory Visit: Payer: Self-pay | Admitting: Internal Medicine

## 2014-06-26 ENCOUNTER — Ambulatory Visit
Admission: RE | Admit: 2014-06-26 | Discharge: 2014-06-26 | Disposition: A | Discharge status: Home / Self Care | Payer: PPO | Source: Ambulatory Visit | Attending: Internal Medicine | Admitting: Internal Medicine

## 2014-06-26 DIAGNOSIS — M25511 Pain in right shoulder: Secondary | ICD-10-CM

## 2014-06-26 DIAGNOSIS — M25512 Pain in left shoulder: Principal | ICD-10-CM

## 2014-08-06 ENCOUNTER — Ambulatory Visit: Payer: PPO | Attending: Internal Medicine | Admitting: Physical Therapy

## 2014-08-06 DIAGNOSIS — M25512 Pain in left shoulder: Secondary | ICD-10-CM | POA: Diagnosis not present

## 2014-08-06 DIAGNOSIS — M25511 Pain in right shoulder: Secondary | ICD-10-CM | POA: Insufficient documentation

## 2014-08-06 DIAGNOSIS — R29898 Other symptoms and signs involving the musculoskeletal system: Secondary | ICD-10-CM | POA: Diagnosis not present

## 2014-08-06 DIAGNOSIS — M25612 Stiffness of left shoulder, not elsewhere classified: Secondary | ICD-10-CM

## 2014-08-06 DIAGNOSIS — M25611 Stiffness of right shoulder, not elsewhere classified: Secondary | ICD-10-CM

## 2014-08-06 NOTE — Therapy (Signed)
[facility] [address] [place], [place], [address] Phone: [phone]   Fax:  [phone]  Physical Therapy Evaluation  Patient Details  Name: Anthony Ball MRN: [id number] Date of Birth: [birth year] Referring Provider:  Georgann Housekeeper, MD  Encounter Date: 08/06/2014      PT End of Session - 08/06/14 1814    Visit Number 1   Number of Visits 12   Date for PT Re-Evaluation 09/17/14   PT Start Time 1500   PT Stop Time 1545   PT Time Calculation (min) 45 min   Activity Tolerance Patient tolerated treatment well   Behavior During Therapy [facility] for tasks assessed/performed      Past Medical History  Diagnosis Date  . COPD (chronic obstructive pulmonary disease)   . Emphysema   . Seasonal allergies   . GERD (gastroesophageal reflux disease)     Past Surgical History  Procedure Laterality Date  . Prostate surgery    . Cardiac catheterization      There were no vitals filed for this visit.  Visit Diagnosis:  Bilateral shoulder pain - Plan: PT plan of care cert/re-cert  Bilateral arm weakness - Plan: PT plan of care cert/re-cert  Decreased ROM of left shoulder - Plan: PT plan of care cert/re-cert  Decreased ROM of right shoulder - Plan: PT plan of care cert/re-cert      Subjective Assessment - 08/06/14 1510    Subjective pt is a 80 y.o M with CC of bil shoulder pain that started a couple months ago insidously. He reports that he was reaching really far to scartch his shoulders was when he first noticed it being sore. He reports problems with reaching and lifting   Limitations Lifting;House hold activities  carrying.   Diagnostic tests 06/26/2014 negative impresison    Patient Stated Goals to get use out of the arm, to be pain free   Currently in Pain? Yes   Pain Score 3    Pain Location Shoulder   Pain Orientation Right;Left   Pain Descriptors / Indicators Tightness;Sore   Pain Type Chronic pain   Pain Onset More  than a month ago   Pain Frequency Constant   Aggravating Factors  lifting, carrying, laying down. (difficulty remember what it feels like or what makes it worse.    Pain Relieving Factors moving it around/eating, applying motion             [facility] PT Assessment - 08/06/14 0001    Assessment   Medical Diagnosis bil shoulder pain   Onset Date --  a couple months ago   Next MD Visit --  august 2016   Prior Therapy yes   Precautions   Precautions None   Restrictions   Weight Bearing Restrictions No   Balance Screen   Has the patient fallen in the past 6 months No   Has the patient had a decrease in activity level because of a fear of falling?  No   Is the patient reluctant to leave their home because of a fear of falling?  No   Home Environment   Living Enviornment Private residence   Living Arrangements Alone   Type of Home House   Home Access Stairs to enter   Entrance Stairs-Number of Steps 5   Entrance Stairs-Rails Can reach both   Home Layout One level   Prior Function   Level of Independence Independent with basic ADLs;Independent with homemaking with ambulation;Independent with transfers;Independent with gait  Vocation Retired   GafferVocation Requirements retired   Leisure difficult remember, being Armed forces training and education officersocial   Cognition   Overall Cognitive Status Within Functional Limits for tasks assessed   Memory Impaired   Memory Impairment --  difficult remember details and things   Observation/Other Assessments   Focus on Therapeutic Outcomes (FOTO)  39% limitatin   predicted 32% limitatoin   Posture/Postural Control   Posture/Postural Control Postural limitations   Postural Limitations Rounded Shoulders;Forward head;Flexed trunk   ROM / Strength   AROM / PROM / Strength AROM;PROM;Strength   AROM   AROM Assessment Site Shoulder   Right/Left Shoulder Right;Left   Right Shoulder Flexion 110 Degrees  pain during motion   Right Shoulder ABduction 112 Degrees   Right Shoulder  External Rotation 78 Degrees   Right Shoulder Horizontal ABduction 90 Degrees   Left Shoulder Flexion 110 Degrees  pain during motion   Left Shoulder ABduction 104 Degrees   Left Shoulder Internal Rotation 40 Degrees   Left Shoulder External Rotation 50 Degrees   PROM   PROM Assessment Site Shoulder   Right/Left Shoulder Right;Left   Right Shoulder Flexion 162 Degrees   Right Shoulder ABduction 168 Degrees   Right Shoulder Internal Rotation 82 Degrees   Right Shoulder External Rotation 80 Degrees   Left Shoulder Flexion 158 Degrees   Left Shoulder ABduction 143 Degrees   Left Shoulder Internal Rotation 82 Degrees   Left Shoulder External Rotation 80 Degrees   Strength   Strength Assessment Site Shoulder   Right/Left Shoulder Right;Left   Right Shoulder Flexion 4-/5   Right Shoulder Extension 4-/5   Right Shoulder ABduction 4-/5   Right Shoulder Internal Rotation 4-/5   Right Shoulder External Rotation 4-/5   Left Shoulder Flexion 4-/5   Left Shoulder Extension 4-/5   Left Shoulder ABduction 4-/5   Left Shoulder Internal Rotation --  4-/5   Left Shoulder External Rotation 4-/5   Special Tests    Special Tests Rotator Cuff Impingement   Rotator Cuff Impingment tests Neer impingement test;Hawkins- Kennedy test;Empty Can test;Full Can test;Painful Arc of Motion   Neer Impingement test    Findings Negative   Hawkins-Kennedy test   Findings Positive   Empty Can test   Findings Negative   Full Can test   Findings Negative   Painful Arc of Motion   Findings Positive                   [facility] Adult PT Treatment/Exercise - 08/06/14 0001    Shoulder Exercises: Standing   External Rotation AROM;Strengthening;Both;15 reps;Theraband   Theraband Level (Shoulder External Rotation) Level 2 (Red)   Internal Rotation AROM;Strengthening;Both;15 reps;Theraband   Theraband Level (Shoulder Internal Rotation) Level 2 (Red)   Shoulder Exercises: ROM/Strengthening   Other  ROM/Strengthening Exercises wand flexion and extension 2 x 10 flexion/ abduction  VC to slow down and proper form                PT Education - 08/06/14 1814    Education provided Yes   Education Details evaluation findings, POC, goals, and HEP.   Person(s) Educated Patient   Methods Explanation   Comprehension Verbalized understanding          PT Short Term Goals - 08/06/14 1822    PT SHORT TERM GOAL #1   Title pt will be I with basic HEP (08/27/2014)   Time 3   Period Weeks   Status New   PT SHORT TERM GOAL #  2   Title pt will increase FOTO score by > 10 points to assist with funcitonal capacity (08/27/2014)   Time 3   Period Weeks   Status New   PT SHORT TERM GOAL #3   Title --           PT Long Term Goals - 08/06/14 1827    PT LONG TERM GOAL #1   Title he will be I with advanced HEP 09/17/14   Time 6   Period Weeks   Status New   PT LONG TERM GOAL #2   Title pt will increase bil shoulder strength to > 4+/5 to assist with ADLs 09/17/14   Time 6   Period Weeks   Status New   PT LONG TERM GOAL #3   Title pt will demonstrate <2/10 pain during and after lifting > 5-10# overhead to assist with functional lifting 09/17/14   Time 6   Period Weeks   Status New   PT LONG TERM GOAL #4   Title He will Increase his FOTO score to atleast 68 upon discharge to assist with functional capacity.    Time 6   Period Weeks   Status New   PT LONG TERM GOAL #5   Title pt will be able to verbalize and demonstrate techniques to reduce risk or bil shoulder reinjury via postural awareness, lifting and carry mechanics, and HEP 09/17/14   Time 6   Period Weeks   Status New               Plan - 08/06/14 1815    Clinical Impression Statement Henreitta CeaGarry presents to OPPT with CC of bil shoulder pain. He demonstrates lmited shoulder AROM with pain noted at endranges. He demonstrates functional strength with no pain during testing. During exercises today pt demonstrates impulsive  behavior and goes overboard with acitvities requiring VC to go within painless range to help facilitate proper exercise progression. during evaluation he demonstrate difficulty with memory retrieval taking increased time and exhibiting diffifuclty explaining.  He would benefit from skilled physical therapy to maximze his funciton by addressing the impairments listed.    Pt will benefit from skilled therapeutic intervention in order to improve on the following deficits Pain;Decreased activity tolerance;Decreased endurance;Decreased range of motion;Decreased strength;Increased muscle spasms;Impaired UE functional use;Postural dysfunction;Improper body mechanics;Decreased knowledge of use of DME;Impaired flexibility;Decreased safety awareness   Rehab Potential Good   PT Frequency 2x / week   PT Duration 8 weeks   PT Treatment/Interventions ADLs/Self Care Home Management;Electrical Stimulation;Cryotherapy;Ultrasound;Moist Heat;Therapeutic activities;Therapeutic exercise;Neuromuscular re-education;Manual techniques;Patient/family education;Dry needling;Passive range of motion   PT Next Visit Plan assess response to HEP, modalites PRN, shoulder mobility, and strengthening   PT Home Exercise Plan See HEP handout   Consulted and Agree with Plan of Care Patient          G-Codes - 08/06/14 1831    Functional Assessment Tool Used FOTO 39% limitation    Functional Limitation Carrying, moving and handling objects   Carrying, Moving and Handling Objects Current Status ([id number]) At least 20 percent but less than 40 percent impaired, limited or restricted   Carrying, Moving and Handling Objects Goal Status ([id number]) At least 1 percent but less than 20 percent impaired, limited or restricted       Problem List There are no active problems to display for this patient.  Lulu RidingKristoffer Deloria Brassfield PT, DPT, LAT, ATC  08/06/2014  6:35 PM    [facility] [address]  [place], [place], [address] Phone: [phone]   Fax:  [phone]

## 2014-08-06 NOTE — Patient Instructions (Signed)
   Scott Fix PT, DPT, LAT, ATC  [facility] Phone: [phone]     

## 2014-08-25 ENCOUNTER — Ambulatory Visit: Payer: PPO | Attending: Internal Medicine | Admitting: Physical Therapy

## 2014-08-25 DIAGNOSIS — R29898 Other symptoms and signs involving the musculoskeletal system: Secondary | ICD-10-CM | POA: Diagnosis present

## 2014-08-25 DIAGNOSIS — M25511 Pain in right shoulder: Secondary | ICD-10-CM | POA: Diagnosis present

## 2014-08-25 DIAGNOSIS — M7582 Other shoulder lesions, left shoulder: Secondary | ICD-10-CM | POA: Diagnosis present

## 2014-08-25 DIAGNOSIS — M25612 Stiffness of left shoulder, not elsewhere classified: Secondary | ICD-10-CM

## 2014-08-25 DIAGNOSIS — M25512 Pain in left shoulder: Secondary | ICD-10-CM | POA: Insufficient documentation

## 2014-08-25 DIAGNOSIS — M25611 Stiffness of right shoulder, not elsewhere classified: Secondary | ICD-10-CM

## 2014-08-25 NOTE — Therapy (Signed)
[id number] [facility] [address] [place], [place], [address] Phone: [phone]   Fax:  [phone]  Physical Therapy Treatment  Patient Details  Name: Anthony LutesGarry L Ball MRN: [id number] Date of Birth: May 10, 1934 Referring Provider:  Georgann HousekeeperHusain, Karrar, MD  Encounter Date: 08/25/2014      PT End of Session - 08/25/14 1108    Visit Number 2   Number of Visits 12   Date for PT Re-Evaluation 09/17/14   PT Start Time 1105   PT Stop Time 1145   PT Time Calculation (min) 40 min      Past Medical History  Diagnosis Date  . COPD (chronic obstructive pulmonary disease)   . Emphysema   . Seasonal allergies   . GERD (gastroesophageal reflux disease)     Past Surgical History  Procedure Laterality Date  . Prostate surgery    . Cardiac catheterization      There were no vitals filed for this visit.  Visit Diagnosis:  Bilateral shoulder pain  Bilateral arm weakness  Decreased ROM of left shoulder  Decreased ROM of right shoulder      Subjective Assessment - 08/25/14 1106    Subjective I take aleve at night. I am limiting myself on how many I take. It seems to iritate my throat and mouth. If I dont take anything my pain will increase at night or the next morning.    Currently in Pain? No/denies   Pain Score --  maybe a 1/10   Aggravating Factors  worse at night and early morning.    Pain Relieving Factors feels better during the day.                          [facility] Adult PT Treatment/Exercise - 08/25/14 1135    Shoulder Exercises: Supine   Protraction Both;20 reps   Protraction Limitations tactile and verbal cues for technique   Flexion Both;10 reps;Weights   Shoulder Flexion Weight (lbs) 2# punches   Other Supine Exercises cane press up and pullovers x 20 added 2 # and repeated a second set each   Shoulder Exercises: Standing   External Rotation 15 reps;Theraband   Theraband Level (Shoulder External Rotation)  Level 2 (Red)   Internal Rotation 15 reps;Theraband   Theraband Level (Shoulder Internal Rotation) Level 2 (Red)   Extension Both;15 reps;Theraband   Theraband Level (Shoulder Extension) Level 2 (Red)   Row Strengthening;Both;15 reps;Theraband   Theraband Level (Shoulder Row) Level 2 (Red)   Shoulder Exercises: Pulleys   Flexion 2 minutes   ABduction 1 minute   Shoulder Exercises: ROM/Strengthening   UBE (Upper Arm Bike) L2 3 min for 2 mi back cues for posture                PT Education - 08/25/14 1152    Education provided Yes   Education Details Red band row and extension   Person(s) Educated Patient   Methods Explanation;Handout   Comprehension Verbalized understanding          PT Short Term Goals - 08/06/14 1822    PT SHORT TERM GOAL #1   Title pt will be I with basic HEP (08/27/2014)   Time 3   Period Weeks   Status New   PT SHORT TERM GOAL #2   Title pt will increase FOTO score by > 10 points to assist with funcitonal capacity (08/27/2014)   Time 3   Period Weeks   Status  New   PT SHORT TERM GOAL #3   Title --           PT Long Term Goals - 08/06/14 1827    PT LONG TERM GOAL #1   Title he will be I with advanced HEP 09/17/14   Time 6   Period Weeks   Status New   PT LONG TERM GOAL #2   Title pt will increase bil shoulder strength to > 4+/5 to assist with ADLs 09/17/14   Time 6   Period Weeks   Status New   PT LONG TERM GOAL #3   Title pt will demonstrate <2/10 pain during and after lifting > 5-10# overhead to assist with functional lifting 09/17/14   Time 6   Period Weeks   Status New   PT LONG TERM GOAL #4   Title He will Increase his FOTO score to atleast 68 upon discharge to assist with functional capacity.    Time 6   Period Weeks   Status New   PT LONG TERM GOAL #5   Title pt will be able to verbalize and demonstrate techniques to reduce risk or bil shoulder reinjury via postural awareness, lifting and carry mechanics, and HEP 09/17/14    Time 6   Period Weeks   Status New               Plan - 08/25/14 1109    Clinical Impression Statement Pt reports he lifted a mechanic jack 50lbs and it caused minimal pain. His pain is noticed  mostly at night and first thing in the morning. He requires verabal and tactile cues with HEP. Progressing toward HEP/strength goals. No increased pain post treatment.   PT Next Visit Plan continue strength and mobility        Problem List There are no active problems to display for this patient.   Sherrie Mustache, [place] 08/25/2014, 11:55 AM  [facility] [address] [place], [place], [address] Phone: [phone]   Fax:  [phone]

## 2014-08-25 NOTE — Patient Instructions (Signed)
EXTENSION: Standing - Resistance Band: Stable (Active)   Stand, right arm at side. Against yellow resistance band, draw arm backward, as far as possible, keeping elbow straight. Complete _2__ sets of __10_ repetitions. Perform _2__ sessions per day.   Resistive Band Rowing   With resistive band anchored in door, grasp both ends. Keeping elbows bent, pull back, squeezing shoulder blades together. Hold __5__ seconds. Repeat __10__ times. 2 sets.  Do _2___ sessions per day.  http://gt2.exer.us/97   Copyright  VHI. All rights reserved.

## 2014-08-28 ENCOUNTER — Ambulatory Visit: Payer: PPO | Admitting: Physical Therapy

## 2014-09-01 ENCOUNTER — Ambulatory Visit: Payer: PPO | Admitting: Physical Therapy

## 2014-09-01 DIAGNOSIS — R29898 Other symptoms and signs involving the musculoskeletal system: Secondary | ICD-10-CM

## 2014-09-01 DIAGNOSIS — M25612 Stiffness of left shoulder, not elsewhere classified: Secondary | ICD-10-CM

## 2014-09-01 DIAGNOSIS — M25511 Pain in right shoulder: Secondary | ICD-10-CM | POA: Diagnosis not present

## 2014-09-01 DIAGNOSIS — M25512 Pain in left shoulder: Principal | ICD-10-CM

## 2014-09-01 DIAGNOSIS — M25611 Stiffness of right shoulder, not elsewhere classified: Secondary | ICD-10-CM

## 2014-09-01 NOTE — Therapy (Signed)
[facility] [address] [place], [place], [address] Phone: [phone]   Fax:  [phone]  Physical Therapy Treatment  Patient Details  Name: Anthony Ball MRN: [id number] Date of Birth: 1934/11/29 Referring Provider:  Georgann Housekeeper, MD  Encounter Date: 09/01/2014      PT End of Session - 09/01/14 1125    Visit Number 3   Number of Visits 12   Date for PT Re-Evaluation 09/17/14   PT Start Time 1109   PT Stop Time 1147   PT Time Calculation (min) 38 min      Past Medical History  Diagnosis Date  . COPD (chronic obstructive pulmonary disease)   . Emphysema   . Seasonal allergies   . GERD (gastroesophageal reflux disease)     Past Surgical History  Procedure Laterality Date  . Prostate surgery    . Cardiac catheterization      There were no vitals filed for this visit.  Visit Diagnosis:  Bilateral shoulder pain  Bilateral arm weakness  Decreased ROM of left shoulder  Decreased ROM of right shoulder      Subjective Assessment - 09/01/14 1115    Subjective Sometimes I think I am getting better but I woke up this morning hurting more left shoulder than right shoulder.    Currently in Pain? No/denies   Aggravating Factors  hurts more in the mornings   Pain Relieving Factors feels better during the day            [facility] PT Assessment - 09/01/14 1129    AROM   Right/Left Shoulder Right;Left   Right Shoulder Flexion 138 Degrees   Right Shoulder ABduction 136 Degrees   Right Shoulder Internal Rotation 60 Degrees   Right Shoulder External Rotation 70 Degrees   Left Shoulder Flexion 138 Degrees   Left Shoulder ABduction 136 Degrees   Left Shoulder Internal Rotation 80 Degrees  reach to T-10   Left Shoulder External Rotation 50 Degrees  reach to T-2                     [facility] Adult PT Treatment/Exercise - 09/01/14 1111    Shoulder Exercises: Supine   Horizontal ABduction Both;20  reps;Theraband   Theraband Level (Shoulder Horizontal ABduction) Level 2 (Red)   Horizontal ABduction Limitations also diagonals with red band x 10 each  pt given handles to attach to bands to decrease thumb pain   Shoulder Exercises: Standing   External Rotation Both;15 reps;Theraband   Theraband Level (Shoulder External Rotation) Level 3 (Green)   Internal Rotation Both;15 reps;Theraband   Theraband Level (Shoulder Internal Rotation) Level 3 (Green)   Extension Strengthening;Both;15 reps;Theraband   Theraband Level (Shoulder Extension) Level 3 (Green)   Row Strengthening;15 reps   Theraband Level (Shoulder Row) Level 3 (Green)   Shoulder Exercises: ROM/Strengthening   UBE (Upper Arm Bike) level 1 3 min for, 3 min back                  PT Short Term Goals - 09/01/14 1125    PT SHORT TERM GOAL #1   Title pt will be I with basic HEP (08/27/2014)   Time 3   Period Weeks   Status Achieved   PT SHORT TERM GOAL #2   Title pt will increase FOTO score by > 10 points to assist with funcitonal capacity (08/27/2014)   Time 3   Period Weeks   Status On-going  PT Long Term Goals - 09/01/14 1126    PT LONG TERM GOAL #1   Title he will be I with advanced HEP 09/17/14   Time 6   Period Weeks   Status On-going   PT LONG TERM GOAL #2   Title pt will increase bil shoulder strength to > 4+/5 to assist with ADLs 09/17/14   Time 6   Period Weeks   Status On-going   PT LONG TERM GOAL #3   Title pt will demonstrate <2/10 pain during and after lifting > 5-10# overhead to assist with functional lifting 09/17/14   Time 6   Period Weeks   Status On-going   PT LONG TERM GOAL #4   Title He will Increase his FOTO score to atleast 68 upon discharge to assist with functional capacity.    Time 6   Period Weeks   Status On-going   PT LONG TERM GOAL #5   Title pt will be able to verbalize and demonstrate techniques to reduce risk or bil shoulder reinjury via postural awareness,  lifting and carry mechanics, and HEP 09/17/14   Time 6   Period Weeks   Status On-going               Plan - 09/01/14 1259    Clinical Impression Statement Pt reports less pain at night and continued morning pain. His ROM has improved especially overhead ROM. Focus today on strengthening.   PT Next Visit Plan continue strength and mobility        Problem List There are no active problems to display for this patient.   Sherrie Mustache, [place] 09/01/2014, 1:00 PM  [facility] [address] [place], [place], [address] Phone: [phone]   Fax:  [phone]

## 2014-09-04 ENCOUNTER — Ambulatory Visit: Payer: PPO | Admitting: Physical Therapy

## 2014-09-08 ENCOUNTER — Ambulatory Visit: Payer: PPO | Admitting: Physical Therapy

## 2014-09-08 DIAGNOSIS — M25612 Stiffness of left shoulder, not elsewhere classified: Secondary | ICD-10-CM

## 2014-09-08 DIAGNOSIS — M25511 Pain in right shoulder: Secondary | ICD-10-CM

## 2014-09-08 DIAGNOSIS — R29898 Other symptoms and signs involving the musculoskeletal system: Secondary | ICD-10-CM

## 2014-09-08 DIAGNOSIS — M25611 Stiffness of right shoulder, not elsewhere classified: Secondary | ICD-10-CM

## 2014-09-08 DIAGNOSIS — M25512 Pain in left shoulder: Principal | ICD-10-CM

## 2014-09-08 NOTE — Therapy (Signed)
[facility] [address] [place], [place], [address] Phone: [phone]   Fax:  [phone]  Physical Therapy Treatment  Patient Details  Name: Anthony Ball MRN: [id number] Date of Birth: 05-16-1934 Referring Provider:  Georgann Housekeeper, MD  Encounter Date: 09/08/2014      PT End of Session - 09/08/14 1232    Visit Number 4   Number of Visits 12   Date for PT Re-Evaluation 09/17/14   PT Start Time 1145   PT Stop Time 1230   PT Time Calculation (min) 45 min   Activity Tolerance Patient tolerated treatment well   Behavior During Therapy [facility] for tasks assessed/performed      Past Medical History  Diagnosis Date  . COPD (chronic obstructive pulmonary disease)   . Emphysema   . Seasonal allergies   . GERD (gastroesophageal reflux disease)     Past Surgical History  Procedure Laterality Date  . Prostate surgery    . Cardiac catheterization      There were no vitals filed for this visit.  Visit Diagnosis:  Bilateral shoulder pain  Bilateral arm weakness  Decreased ROM of left shoulder  Decreased ROM of right shoulder      Subjective Assessment - 09/08/14 1153    Subjective "I still have some tightness in the shouder with occasional pain when I first wake up" he reports trying to being consistent with most of his HEP but forgets to do some of them.    Currently in Pain? No/denies   Pain Score 0-No pain   Pain Location Shoulder   Pain Orientation Left;Right            [facility] PT Assessment - 09/08/14 0001    AROM   Right Shoulder Flexion 140 Degrees   Right Shoulder ABduction 132 Degrees   Right Shoulder Internal Rotation --  t10   Right Shoulder External Rotation --  T-2   Left Shoulder Flexion 140 Degrees   Left Shoulder ABduction 140 Degrees   Left Shoulder Internal Rotation --  t10   Left Shoulder External Rotation --  T-2                     [facility] Adult PT Treatment/Exercise -  09/08/14 0001    Shoulder Exercises: Supine   Protraction Both;10 reps  2# x 2 sets   Horizontal ABduction Both;20 reps;Theraband  VC to keep arms horizontal   Theraband Level (Shoulder Horizontal ABduction) Level 3 (Green)   Flexion Both;10 reps;Theraband  scaption   Other Supine Exercises diagonals with red theraband bil 2 x 10 ea.   Shoulder Exercises: Standing   External Rotation Both;15 reps;Theraband   Theraband Level (Shoulder External Rotation) Level 3 (Green)   Internal Rotation Both;15 reps;Theraband   Theraband Level (Shoulder Internal Rotation) Level 3 (Green)   Extension Strengthening;Both;15 reps;Theraband   Theraband Level (Shoulder Extension) Level 3 (Green)   Row Strengthening;15 reps   Theraband Level (Shoulder Row) Level 3 (Green)   Other Standing Exercises bicep curls 1 x 10   5#   Shoulder Exercises: Pulleys   Flexion 2 minutes   ABduction 1 minute   Shoulder Exercises: ROM/Strengthening   Other ROM/Strengthening Exercises wand flexion and extension 2 x 10 flexion/ abduction  in supine                PT Education - 09/08/14 1232    Education provided Yes   Education Details added scaptoin exercises, and serratus  punch in supine   Person(s) Educated Patient   Methods Explanation   Comprehension Verbalized understanding;Returned demonstration          PT Short Term Goals - 09/01/14 1125    PT SHORT TERM GOAL #1   Title pt will be I with basic HEP (08/27/2014)   Time 3   Period Weeks   Status Achieved   PT SHORT TERM GOAL #2   Title pt will increase FOTO score by > 10 points to assist with funcitonal capacity (08/27/2014)   Time 3   Period Weeks   Status On-going           PT Long Term Goals - 09/01/14 1126    PT LONG TERM GOAL #1   Title he will be I with advanced HEP 09/17/14   Time 6   Period Weeks   Status On-going   PT LONG TERM GOAL #2   Title pt will increase bil shoulder strength to > 4+/5 to assist with ADLs 09/17/14    Time 6   Period Weeks   Status On-going   PT LONG TERM GOAL #3   Title pt will demonstrate <2/10 pain during and after lifting > 5-10# overhead to assist with functional lifting 09/17/14   Time 6   Period Weeks   Status On-going   PT LONG TERM GOAL #4   Title He will Increase his FOTO score to atleast 68 upon discharge to assist with functional capacity.    Time 6   Period Weeks   Status On-going   PT LONG TERM GOAL #5   Title pt will be able to verbalize and demonstrate techniques to reduce risk or bil shoulder reinjury via postural awareness, lifting and carry mechanics, and HEP 09/17/14   Time 6   Period Weeks   Status On-going               Plan - 09/08/14 1233    Clinical Impression Statement Court presents to therapy with report that he is decreasing his shoulder pain but continues to demonstrate pain in the shoulder and stiffness in the AM. He tolerated all exercises well today but reqiures VC to slow down and perform exercises slow and controlled.    PT Next Visit Plan continue strength and mobility   PT Home Exercise Plan added scaptoin exercises, and serratus punch in supine   Consulted and Agree with Plan of Care Patient        Problem List There are no active problems to display for this patient.  Lulu Riding PT, DPT, LAT, ATC  09/08/2014  12:35 PM    [facility] [address] [place], [place], [address] Phone: [phone]   Fax:  [phone]

## 2014-09-08 NOTE — Patient Instructions (Signed)
   Lauriel Helin PT, DPT, LAT, ATC  [facility] Phone: [phone]     

## 2014-09-11 ENCOUNTER — Ambulatory Visit: Payer: PPO | Admitting: Physical Therapy

## 2014-09-15 ENCOUNTER — Ambulatory Visit: Payer: PPO | Admitting: Physical Therapy

## 2014-09-15 DIAGNOSIS — M25511 Pain in right shoulder: Secondary | ICD-10-CM | POA: Diagnosis not present

## 2014-09-15 DIAGNOSIS — R29898 Other symptoms and signs involving the musculoskeletal system: Secondary | ICD-10-CM

## 2014-09-15 DIAGNOSIS — M25512 Pain in left shoulder: Principal | ICD-10-CM

## 2014-09-15 DIAGNOSIS — M25611 Stiffness of right shoulder, not elsewhere classified: Secondary | ICD-10-CM

## 2014-09-15 DIAGNOSIS — M25612 Stiffness of left shoulder, not elsewhere classified: Secondary | ICD-10-CM

## 2014-09-15 NOTE — Therapy (Signed)
Anthony Ball, [place], [address] Phone: [phone]   Fax:  [phone]  Physical Therapy Treatment / Re-evaluation  Patient Details  Name: Anthony Ball MRN: [id number] Date of Birth: 04-12-1934 Referring Provider:  Wenda Low, MD  Encounter Date: 09/15/2014      PT End of Session - 09/15/14 1129    Visit Number 5   Number of Visits 12   Date for PT Re-Evaluation 10/06/14   PT Start Time 1100   PT Stop Time 1145   PT Time Calculation (min) 45 min   Activity Tolerance Patient tolerated treatment well   Behavior During Therapy [facility] for tasks assessed/performed      Past Medical History  Diagnosis Date  . COPD (chronic obstructive pulmonary disease)   . Emphysema   . Seasonal allergies   . GERD (gastroesophageal reflux disease)     Past Surgical History  Procedure Laterality Date  . Prostate surgery    . Cardiac catheterization      There were no vitals filed for this visit.  Visit Diagnosis:  Bilateral shoulder pain - Plan: PT plan of care cert/re-cert  Bilateral arm weakness - Plan: PT plan of care cert/re-cert  Decreased ROM of left shoulder - Plan: PT plan of care cert/re-cert  Decreased ROM of right shoulder - Plan: PT plan of care cert/re-cert      Subjective Assessment - 09/15/14 1106    Subjective "I have been keeping up with my exercises and can tell I believe that it is getting a little better but want to be sure"   Currently in Pain? No/denies   Pain Score 0-No pain   Pain Location Shoulder   Pain Orientation Left;Right            [facility] PT Assessment - 09/15/14 0001    Assessment   Medical Diagnosis bil shoulder pain   Onset Date/Surgical Date --  couple months ago   Prior Therapy yes   Precautions   Precautions None   Restrictions   Weight Bearing Restrictions No   Balance Screen   Has the patient fallen in the past 6 months No   Has the patient had a  decrease in activity level because of a fear of falling?  No   Is the patient reluctant to leave their home because of a fear of falling?  No   Home Environment   Living Environment Private residence   Living Arrangements Alone   Type of [place] to enter   Entrance Stairs-Number of Steps 5   Entrance Stairs-Rails Can reach both   [place] One level   Prior Function   Level of Independence Independent with basic ADLs;Independent with homemaking with ambulation;Independent with transfers;Independent with gait   Vocation Retired   [employer] retired   Leisure difficult remember, being social   Cognition   Overall Cognitive Status Within Functional Limits for tasks assessed   Observation/Other Assessments   Focus on Therapeutic Outcomes (FOTO)  31% limitation   AROM   Right Shoulder Flexion 140 Degrees   Right Shoulder ABduction 140 Degrees   Right Shoulder Internal Rotation 58 Degrees   Right Shoulder External Rotation 64 Degrees   Left Shoulder Flexion 140 Degrees   Left Shoulder ABduction 152 Degrees   Left Shoulder Internal Rotation 58 Degrees   Left Shoulder External Rotation 62 Degrees   Strength   Right Shoulder Flexion 4/5   Right Shoulder  Extension 4/5   Right Shoulder ABduction 4/5   Right Shoulder Internal Rotation 4/5   Left Shoulder Flexion 4/5   Left Shoulder Extension 4/5   Left Shoulder Internal Rotation 4/5   Left Shoulder External Rotation 4/5                     [facility] Adult PT Treatment/Exercise - 09/15/14 1107    Shoulder Exercises: Supine   Protraction Both;15 reps  5#   Horizontal ABduction Both;20 reps;Theraband   Theraband Level (Shoulder Horizontal ABduction) Level 3 (Green)   Flexion Both;10 reps;Theraband   Flexion Limitations Scaption   Shoulder Exercises: Standing   External Rotation Both;15 reps;Theraband   Theraband Level (Shoulder External Rotation) Level 3 (Green)   Internal Rotation  Both;15 reps;Theraband   Theraband Level (Shoulder Internal Rotation) Level 3 (Green)   Extension Strengthening;Both;15 reps;Theraband   Theraband Level (Shoulder Extension) Level 3 (Green)   Row Strengthening;15 reps   Theraband Level (Shoulder Row) Level 3 (Green)   Other Standing Exercises bicep curls 1 x 10    Other Standing Exercises D1 /D2 x 10 ea   with red theraband   Shoulder Exercises: ROM/Strengthening   UBE (Upper Arm Bike) level 1 3 min for, 3 min back   Other ROM/Strengthening Exercises wand flexion and extension 2 x 10 flexion/ abduction  with 4#                PT Education - 09/15/14 1151    Education provided Yes   Education Details HEP review, update POC to 1 x week for 3 weeks to assist with transition to Indepednetn exercise.    Person(s) Educated Patient   Methods Explanation   Comprehension Verbalized understanding          PT Short Term Goals - 09/15/14 1252    PT SHORT TERM GOAL #1   Title pt will be I with basic HEP (08/27/2014)   Time 3   Period Weeks   Status Achieved   PT SHORT TERM GOAL #2   Title pt will increase FOTO score by > 10 points to assist with funcitonal capacity (08/27/2014)   Time 3   Period Weeks   Status Partially Met           PT Long Term Goals - 09/15/14 1253    PT LONG TERM GOAL #1   Title he will be I with advanced HEP 09/17/14   Time 6   Period Weeks   Status On-going   PT LONG TERM GOAL #2   Title pt will increase bil shoulder strength to > 4+/5 to assist with ADLs 09/17/14   Time 6   Period Weeks   Status On-going   PT LONG TERM GOAL #3   Title pt will demonstrate <2/10 pain during and after lifting > 5-10# overhead to assist with functional lifting 09/17/14   Time 6   Period Weeks   Status On-going   PT LONG TERM GOAL #4   Title He will Increase his FOTO score to atleast 68 upon discharge to assist with functional capacity.    Time 6   Period Weeks   Status Achieved   PT LONG TERM GOAL #5   Title  pt will be able to verbalize and demonstrate techniques to reduce risk or bil shoulder reinjury via postural awareness, lifting and carry mechanics, and HEP 09/17/14   Time 6   Period Weeks   Status On-going  Plan - October 09, 2014 1250    Clinical Impression Statement Anthony Ball continues to make great progress with increase shoulder mobility and strength. He met STG 1 and partially met STG 2, and met LTG #3. He continues to have some pain at endrange abduction but reports it isn't as bad as it used to be. When asked  about pain he reported he believes its getting better but was unsure about the progress.  Plan to continue wiht POC for 1 x a week for 3 weeks to help transition to independent exercises.    Pt will benefit from skilled therapeutic intervention in order to improve on the following deficits Pain;Decreased activity tolerance;Decreased endurance;Decreased range of motion;Decreased strength;Increased muscle spasms;Impaired UE functional use;Postural dysfunction;Improper body mechanics;Decreased knowledge of use of DME;Impaired flexibility;Decreased safety awareness   Rehab Potential Good   PT Frequency 1x / week   PT Duration 3 weeks   PT Treatment/Interventions ADLs/Self Care Home Management;Electrical Stimulation;Cryotherapy;Ultrasound;Moist Heat;Therapeutic activities;Therapeutic exercise;Neuromuscular re-education;Manual techniques;Patient/family education;Dry needling;Passive range of motion   PT Next Visit Plan continue strength and mobility   PT Home Exercise Plan HEP review   Consulted and Agree with Plan of Care Patient          G-Codes - 10/09/14 1151    Functional Assessment Tool Used 31% limitation   Functional Limitation Carrying, moving and handling objects   Carrying, Moving and Handling Objects Current Status (H4742) At least 20 percent but less than 40 percent impaired, limited or restricted   Carrying, Moving and Handling Objects Goal Status (V9563) At  least 1 percent but less than 20 percent impaired, limited or restricted      Problem List There are no active problems to display for this patient.  Starr Lake PT, DPT, LAT, ATC  Oct 09, 2014  1:00 PM    [facility] [address] [place], [place], [address] Phone: [phone]   Fax:  [phone]

## 2014-09-18 ENCOUNTER — Ambulatory Visit: Payer: PPO | Attending: Internal Medicine | Admitting: Physical Therapy

## 2014-09-18 DIAGNOSIS — M7582 Other shoulder lesions, left shoulder: Secondary | ICD-10-CM | POA: Insufficient documentation

## 2014-09-18 DIAGNOSIS — M25511 Pain in right shoulder: Secondary | ICD-10-CM | POA: Insufficient documentation

## 2014-09-18 DIAGNOSIS — M25512 Pain in left shoulder: Secondary | ICD-10-CM | POA: Insufficient documentation

## 2014-09-18 DIAGNOSIS — R29898 Other symptoms and signs involving the musculoskeletal system: Secondary | ICD-10-CM | POA: Insufficient documentation

## 2014-09-29 ENCOUNTER — Ambulatory Visit: Payer: PPO | Admitting: Physical Therapy

## 2014-09-29 DIAGNOSIS — M25612 Stiffness of left shoulder, not elsewhere classified: Secondary | ICD-10-CM

## 2014-09-29 DIAGNOSIS — M25512 Pain in left shoulder: Secondary | ICD-10-CM | POA: Diagnosis present

## 2014-09-29 DIAGNOSIS — R29898 Other symptoms and signs involving the musculoskeletal system: Secondary | ICD-10-CM | POA: Diagnosis present

## 2014-09-29 DIAGNOSIS — M25511 Pain in right shoulder: Secondary | ICD-10-CM | POA: Diagnosis not present

## 2014-09-29 DIAGNOSIS — M25611 Stiffness of right shoulder, not elsewhere classified: Secondary | ICD-10-CM

## 2014-09-29 DIAGNOSIS — M7582 Other shoulder lesions, left shoulder: Secondary | ICD-10-CM | POA: Diagnosis present

## 2014-09-29 NOTE — Patient Instructions (Signed)
   Trudi Morgenthaler PT, DPT, LAT, ATC  [facility] Phone: [phone]     

## 2014-09-29 NOTE — Therapy (Addendum)
[place], [place], [address] Phone: [phone]   Fax:  [phone]  Physical Therapy Treatment  Patient Details  Name: Anthony Ball MRN: [id number] Date of Birth: 11-17-1934 Referring Provider:  Wenda Low, MD  Encounter Date: 09/29/2014      PT End of Session - 09/29/14 1229    Visit Number 6   Number of Visits 12   Date for PT Re-Evaluation 10/06/14   PT Start Time 8366   PT Stop Time 1230   PT Time Calculation (min) 45 min   Activity Tolerance Patient tolerated treatment well   Behavior During Therapy [facility] for tasks assessed/performed      Past Medical History  Diagnosis Date  . COPD (chronic obstructive pulmonary disease)   . Emphysema   . Seasonal allergies   . GERD (gastroesophageal reflux disease)     Past Surgical History  Procedure Laterality Date  . Prostate surgery    . Cardiac catheterization      There were no vitals filed for this visit.  Visit Diagnosis:  Bilateral shoulder pain  Bilateral arm weakness  Decreased ROM of left shoulder  Decreased ROM of right shoulder      Subjective Assessment - 09/29/14 1154    Subjective "I had some pain in the shoulder this morning but that could be due to me sleeping on it, right now I don't have any pain"   Currently in Pain? No/denies   Pain Score 0-No pain                         [facility] Adult PT Treatment/Exercise - 09/29/14 0001    Shoulder Exercises: Supine   Protraction Both;15 reps  6#   Horizontal ABduction Both;Theraband;12 reps   Theraband Level (Shoulder Horizontal ABduction) Level 4 (Blue)   Flexion Both;10 reps;Theraband   Theraband Level (Shoulder Flexion) Level 3 (Green)   Flexion Limitations Scaption   Other Supine Exercises diagonals with red theraband bil 2 x 10 ea.   Shoulder Exercises: Standing   External Rotation Both;15 reps;Theraband   Theraband Level (Shoulder External  Rotation) Level 4 (Blue)   Internal Rotation Both;15 reps;Theraband   Theraband Level (Shoulder Internal Rotation) Level 4 (Blue)   Extension Strengthening;Both;15 reps;Theraband   Theraband Level (Shoulder Extension) Level 4 (Blue)   Row Strengthening;15 reps   Theraband Level (Shoulder Row) Level 4 (Blue)   Other Standing Exercises bicep curls 2 x 10   6#   Shoulder Exercises: ROM/Strengthening   UBE (Upper Arm Bike) level 1.5 3 min for, 3 min back                PT Education - 09/29/14 1229    Education provided Yes   Education Details updated HEP   Person(s) Educated Patient   Methods Explanation   Comprehension Verbalized understanding          PT Short Term Goals - 09/15/14 1252    PT SHORT TERM GOAL #1   Title pt will be I with basic HEP (08/27/2014)   Time 3   Period Weeks   Status Achieved   PT SHORT TERM GOAL #2   Title pt will increase FOTO score by > 10 points to assist with funcitonal capacity (08/27/2014)   Time 3   Period Weeks   Status Partially Met           PT Long Term Goals - 09/15/14 1253  PT LONG TERM GOAL #1   Title he will be I with advanced HEP 09/17/14   Time 6   Period Weeks   Status On-going   PT LONG TERM GOAL #2   Title pt will increase bil shoulder strength to > 4+/5 to assist with ADLs 09/17/14   Time 6   Period Weeks   Status On-going   PT LONG TERM GOAL #3   Title pt will demonstrate <2/10 pain during and after lifting > 5-10# overhead to assist with functional lifting 09/17/14   Time 6   Period Weeks   Status On-going   PT LONG TERM GOAL #4   Title He will Increase his FOTO score to atleast 68 upon discharge to assist with functional capacity.    Time 6   Period Weeks   Status Achieved   PT LONG TERM GOAL #5   Title pt will be able to verbalize and demonstrate techniques to reduce risk or bil shoulder reinjury via postural awareness, lifting and carry mechanics, and HEP 09/17/14   Time 6   Period Weeks   Status  On-going               Plan - 09/29/14 1230    Clinical Impression Statement Hoy Morn tolerated treament well today with progression of exercises using heavier therband and increasing reps. He reports doing his HEP at home consistently. Plan to possibly discharge next visit.    PT Next Visit Plan FOTO, Goals, HEP, possibly discharge   PT Home Exercise Plan wall push-ups   Consulted and Agree with Plan of Care Patient        Problem List There are no active problems to display for this patient.  Starr Lake PT, DPT, LAT, ATC  09/29/2014  12:33 PM      [facility] [address] [place], [place], [address] Phone: [phone]   Fax:  [phone]                      PHYSICAL THERAPY DISCHARGE SUMMARY  Visits from Start of Care: 6  Current functional level related to goals / functional outcomes: FOTO 31% limited    Remaining deficits: See goals   Education / Equipment: HEP  Plan:                                                    Patient goals were partially met. Patient is being discharged due to not returning since the last visit.  ?????          Sidney Kann PT, DPT, LAT, ATC  11/26/2014  2:14 PM

## 2014-10-06 ENCOUNTER — Ambulatory Visit: Payer: PPO | Admitting: Physical Therapy

## 2014-10-13 ENCOUNTER — Telehealth: Payer: Self-pay | Admitting: Physical Therapy

## 2014-10-13 ENCOUNTER — Ambulatory Visit: Payer: PPO | Admitting: Physical Therapy

## 2014-10-13 NOTE — Telephone Encounter (Signed)
Left message regarding that he missed his appointment today at 11am and that he has an appointment at 11:45 on 10/20/2014. If he had any questions to call back.

## 2014-10-20 ENCOUNTER — Telehealth: Payer: Self-pay | Admitting: Physical Therapy

## 2014-10-20 ENCOUNTER — Ambulatory Visit: Payer: PPO | Attending: Internal Medicine | Admitting: Physical Therapy

## 2014-10-20 NOTE — Telephone Encounter (Signed)
Left message regarding missing today's appointment and that he had no more scheduled appointments. Asked if he could call back and let us know if he doesn't require any more physical therapy and we can discharge him, but if he feels like he needs to continues to schedule an appointment.

## 2015-04-16 DIAGNOSIS — E039 Hypothyroidism, unspecified: Secondary | ICD-10-CM | POA: Diagnosis not present

## 2015-04-16 DIAGNOSIS — Z23 Encounter for immunization: Secondary | ICD-10-CM | POA: Diagnosis not present

## 2015-04-16 DIAGNOSIS — D72819 Decreased white blood cell count, unspecified: Secondary | ICD-10-CM | POA: Diagnosis not present

## 2015-04-16 DIAGNOSIS — N400 Enlarged prostate without lower urinary tract symptoms: Secondary | ICD-10-CM | POA: Diagnosis not present

## 2015-04-16 DIAGNOSIS — F419 Anxiety disorder, unspecified: Secondary | ICD-10-CM | POA: Diagnosis not present

## 2015-04-16 DIAGNOSIS — R413 Other amnesia: Secondary | ICD-10-CM | POA: Diagnosis not present

## 2015-04-16 DIAGNOSIS — Z1389 Encounter for screening for other disorder: Secondary | ICD-10-CM | POA: Diagnosis not present

## 2015-04-16 DIAGNOSIS — J449 Chronic obstructive pulmonary disease, unspecified: Secondary | ICD-10-CM | POA: Diagnosis not present

## 2015-04-16 DIAGNOSIS — M1990 Unspecified osteoarthritis, unspecified site: Secondary | ICD-10-CM | POA: Diagnosis not present

## 2015-04-16 DIAGNOSIS — Z0000 Encounter for general adult medical examination without abnormal findings: Secondary | ICD-10-CM | POA: Diagnosis not present

## 2015-04-16 DIAGNOSIS — K219 Gastro-esophageal reflux disease without esophagitis: Secondary | ICD-10-CM | POA: Diagnosis not present

## 2015-06-16 DIAGNOSIS — R413 Other amnesia: Secondary | ICD-10-CM | POA: Diagnosis not present

## 2015-06-16 DIAGNOSIS — K589 Irritable bowel syndrome without diarrhea: Secondary | ICD-10-CM | POA: Diagnosis not present

## 2015-10-25 DIAGNOSIS — K219 Gastro-esophageal reflux disease without esophagitis: Secondary | ICD-10-CM | POA: Diagnosis not present

## 2015-10-25 DIAGNOSIS — E039 Hypothyroidism, unspecified: Secondary | ICD-10-CM | POA: Diagnosis not present

## 2015-10-25 DIAGNOSIS — R413 Other amnesia: Secondary | ICD-10-CM | POA: Diagnosis not present

## 2016-03-31 DIAGNOSIS — F419 Anxiety disorder, unspecified: Secondary | ICD-10-CM | POA: Diagnosis not present

## 2016-03-31 DIAGNOSIS — R413 Other amnesia: Secondary | ICD-10-CM | POA: Diagnosis not present

## 2016-03-31 DIAGNOSIS — J449 Chronic obstructive pulmonary disease, unspecified: Secondary | ICD-10-CM | POA: Diagnosis not present

## 2016-03-31 DIAGNOSIS — Z23 Encounter for immunization: Secondary | ICD-10-CM | POA: Diagnosis not present

## 2016-03-31 DIAGNOSIS — K219 Gastro-esophageal reflux disease without esophagitis: Secondary | ICD-10-CM | POA: Diagnosis not present

## 2016-03-31 DIAGNOSIS — N400 Enlarged prostate without lower urinary tract symptoms: Secondary | ICD-10-CM | POA: Diagnosis not present

## 2016-03-31 DIAGNOSIS — E039 Hypothyroidism, unspecified: Secondary | ICD-10-CM | POA: Diagnosis not present

## 2016-03-31 DIAGNOSIS — D72819 Decreased white blood cell count, unspecified: Secondary | ICD-10-CM | POA: Diagnosis not present

## 2016-03-31 DIAGNOSIS — J309 Allergic rhinitis, unspecified: Secondary | ICD-10-CM | POA: Diagnosis not present

## 2016-03-31 DIAGNOSIS — M1990 Unspecified osteoarthritis, unspecified site: Secondary | ICD-10-CM | POA: Diagnosis not present

## 2016-06-29 DIAGNOSIS — Z1389 Encounter for screening for other disorder: Secondary | ICD-10-CM | POA: Diagnosis not present

## 2016-06-29 DIAGNOSIS — R413 Other amnesia: Secondary | ICD-10-CM | POA: Diagnosis not present

## 2016-06-29 DIAGNOSIS — E039 Hypothyroidism, unspecified: Secondary | ICD-10-CM | POA: Diagnosis not present

## 2016-06-29 DIAGNOSIS — M1990 Unspecified osteoarthritis, unspecified site: Secondary | ICD-10-CM | POA: Diagnosis not present

## 2016-06-29 DIAGNOSIS — N400 Enlarged prostate without lower urinary tract symptoms: Secondary | ICD-10-CM | POA: Diagnosis not present

## 2016-06-29 DIAGNOSIS — J449 Chronic obstructive pulmonary disease, unspecified: Secondary | ICD-10-CM | POA: Diagnosis not present

## 2016-06-29 DIAGNOSIS — K219 Gastro-esophageal reflux disease without esophagitis: Secondary | ICD-10-CM | POA: Diagnosis not present

## 2016-11-09 DIAGNOSIS — H04123 Dry eye syndrome of bilateral lacrimal glands: Secondary | ICD-10-CM | POA: Diagnosis not present

## 2016-11-09 DIAGNOSIS — H2513 Age-related nuclear cataract, bilateral: Secondary | ICD-10-CM | POA: Diagnosis not present

## 2016-11-09 DIAGNOSIS — H40012 Open angle with borderline findings, low risk, left eye: Secondary | ICD-10-CM | POA: Diagnosis not present

## 2016-11-09 DIAGNOSIS — H524 Presbyopia: Secondary | ICD-10-CM | POA: Diagnosis not present

## 2016-11-09 DIAGNOSIS — H40002 Preglaucoma, unspecified, left eye: Secondary | ICD-10-CM | POA: Diagnosis not present

## 2016-11-29 DIAGNOSIS — B351 Tinea unguium: Secondary | ICD-10-CM | POA: Diagnosis not present

## 2016-11-29 DIAGNOSIS — R413 Other amnesia: Secondary | ICD-10-CM | POA: Diagnosis not present

## 2016-11-29 DIAGNOSIS — J309 Allergic rhinitis, unspecified: Secondary | ICD-10-CM | POA: Diagnosis not present

## 2016-11-29 DIAGNOSIS — Z23 Encounter for immunization: Secondary | ICD-10-CM | POA: Diagnosis not present

## 2016-11-29 DIAGNOSIS — E039 Hypothyroidism, unspecified: Secondary | ICD-10-CM | POA: Diagnosis not present

## 2016-12-05 ENCOUNTER — Ambulatory Visit: Payer: PPO | Admitting: Podiatry

## 2016-12-22 ENCOUNTER — Ambulatory Visit: Payer: PPO | Admitting: Podiatry

## 2017-01-10 ENCOUNTER — Ambulatory Visit (INDEPENDENT_AMBULATORY_CARE_PROVIDER_SITE_OTHER): Payer: PPO | Admitting: Podiatry

## 2017-01-10 DIAGNOSIS — B351 Tinea unguium: Secondary | ICD-10-CM | POA: Diagnosis not present

## 2017-01-10 DIAGNOSIS — M79674 Pain in right toe(s): Secondary | ICD-10-CM

## 2017-01-10 DIAGNOSIS — M79675 Pain in left toe(s): Secondary | ICD-10-CM | POA: Diagnosis not present

## 2017-01-10 NOTE — Patient Instructions (Signed)

## 2017-01-10 NOTE — Progress Notes (Signed)
   Subjective:    Patient ID: Anthony Ball, male    DOB: 1934/08/15, 82 y.o.   MRN: [id number]  HPI    Review of Systems  All other systems reviewed and are negative.      Objective:   Physical Exam        Assessment & Plan:

## 2017-01-12 NOTE — Progress Notes (Signed)
Subjective:    Patient ID: Anthony Ball, male   DOB: 82 y.o.   MRN: [id number]   HPI patient presents stating that he's been having trouble with his nails and they've been thick dystrophic and they're getting painful with shoes and he cannot cut them himself. Patient does not smoke currently    Review of Systems  All other systems reviewed and are negative.       Objective:  Physical Exam  Constitutional: He appears well-developed and well-nourished.  Cardiovascular: Intact distal pulses.   Pulmonary/Chest: Effort normal.  Musculoskeletal: Normal range of motion.  Neurological: He is alert.  Skin: Skin is warm.  Nursing note and vitals reviewed.  neurovascular status found to be intact muscle strength adequate range of motion within normal limits with patient found to have thickened dystrophic nailbeds 1 through 5 both feet with 3 nails on each foot being excessively thick and painful. Patient's found have good digital perfusion well oriented     Assessment:   Chronic mycotic nail infection bilateral with pain      Plan:    H&P condition reviewed and deep debridement of nailbeds accomplished today with no iatrogenic bleeding noted. Patient will be seen back to recheck

## 2017-01-18 DIAGNOSIS — N401 Enlarged prostate with lower urinary tract symptoms: Secondary | ICD-10-CM | POA: Diagnosis not present

## 2017-02-23 ENCOUNTER — Ambulatory Visit: Payer: Self-pay | Admitting: *Deleted

## 2017-02-23 NOTE — Telephone Encounter (Signed)
Called in c/o "pain in my crotch area"   "I think it's my prostate and colon"   Is not established with a doctor.   He mentioned he had an appt today at 11:15 but a foreign doctor.   He was not able to give me his name or the practice because he was afraid I would tell on him.   He told me he did not want to see this doctor because he is "of a different culture"    "He is muslim"   I asked him if he has had a problem with this dr.   He said,  "No"   "He has been a great dr"   I encouraged him to keep his appt with this dr if he had not had a problem with the dr.   He stated he sees an [facility] doctor but he was not able to give me the name. He was very vague in his information.  Did not know the names of his drs or the name of the practice.   I could not fine anything in his chart where he is established with a physician within our system. He would only say he is having "pain in my crotch area"    He stated,  "I will do something but I need to go because someone is calling me on the other line"  I encouraged him to go to the 11:15am appt today before he hung up.   He stated,   "I will do something"  And hung up.

## 2017-02-28 DIAGNOSIS — Z86010 Personal history of colonic polyps: Secondary | ICD-10-CM | POA: Diagnosis not present

## 2017-04-04 DIAGNOSIS — Z86010 Personal history of colonic polyps: Secondary | ICD-10-CM | POA: Diagnosis not present

## 2017-04-04 DIAGNOSIS — K5730 Diverticulosis of large intestine without perforation or abscess without bleeding: Secondary | ICD-10-CM | POA: Diagnosis not present

## 2017-04-04 DIAGNOSIS — K640 First degree hemorrhoids: Secondary | ICD-10-CM | POA: Diagnosis not present

## 2017-04-16 ENCOUNTER — Other Ambulatory Visit: Payer: PPO

## 2017-04-18 DIAGNOSIS — Z0000 Encounter for general adult medical examination without abnormal findings: Secondary | ICD-10-CM | POA: Diagnosis not present

## 2017-04-18 DIAGNOSIS — Z1389 Encounter for screening for other disorder: Secondary | ICD-10-CM | POA: Diagnosis not present

## 2017-04-18 DIAGNOSIS — Z7189 Other specified counseling: Secondary | ICD-10-CM | POA: Diagnosis not present

## 2017-04-18 DIAGNOSIS — J449 Chronic obstructive pulmonary disease, unspecified: Secondary | ICD-10-CM | POA: Diagnosis not present

## 2017-04-18 DIAGNOSIS — K219 Gastro-esophageal reflux disease without esophagitis: Secondary | ICD-10-CM | POA: Diagnosis not present

## 2017-04-18 DIAGNOSIS — F0390 Unspecified dementia without behavioral disturbance: Secondary | ICD-10-CM | POA: Diagnosis not present

## 2017-04-18 DIAGNOSIS — N400 Enlarged prostate without lower urinary tract symptoms: Secondary | ICD-10-CM | POA: Diagnosis not present

## 2017-04-18 DIAGNOSIS — E039 Hypothyroidism, unspecified: Secondary | ICD-10-CM | POA: Diagnosis not present

## 2017-04-18 DIAGNOSIS — J309 Allergic rhinitis, unspecified: Secondary | ICD-10-CM | POA: Diagnosis not present

## 2017-04-18 DIAGNOSIS — Z23 Encounter for immunization: Secondary | ICD-10-CM | POA: Diagnosis not present

## 2017-04-18 DIAGNOSIS — F419 Anxiety disorder, unspecified: Secondary | ICD-10-CM | POA: Diagnosis not present

## 2017-04-18 DIAGNOSIS — M1990 Unspecified osteoarthritis, unspecified site: Secondary | ICD-10-CM | POA: Diagnosis not present

## 2017-04-18 DIAGNOSIS — D72819 Decreased white blood cell count, unspecified: Secondary | ICD-10-CM | POA: Diagnosis not present

## 2017-07-27 DIAGNOSIS — R6889 Other general symptoms and signs: Secondary | ICD-10-CM | POA: Diagnosis not present

## 2017-07-27 DIAGNOSIS — F0390 Unspecified dementia without behavioral disturbance: Secondary | ICD-10-CM | POA: Diagnosis not present

## 2017-08-24 DIAGNOSIS — J449 Chronic obstructive pulmonary disease, unspecified: Secondary | ICD-10-CM | POA: Diagnosis not present

## 2017-08-24 DIAGNOSIS — K219 Gastro-esophageal reflux disease without esophagitis: Secondary | ICD-10-CM | POA: Diagnosis not present

## 2017-08-24 DIAGNOSIS — J309 Allergic rhinitis, unspecified: Secondary | ICD-10-CM | POA: Diagnosis not present

## 2017-08-24 DIAGNOSIS — E039 Hypothyroidism, unspecified: Secondary | ICD-10-CM | POA: Diagnosis not present

## 2017-08-24 DIAGNOSIS — F419 Anxiety disorder, unspecified: Secondary | ICD-10-CM | POA: Diagnosis not present

## 2017-08-24 DIAGNOSIS — N400 Enlarged prostate without lower urinary tract symptoms: Secondary | ICD-10-CM | POA: Diagnosis not present

## 2017-08-24 DIAGNOSIS — M1990 Unspecified osteoarthritis, unspecified site: Secondary | ICD-10-CM | POA: Diagnosis not present

## 2017-08-24 DIAGNOSIS — F0390 Unspecified dementia without behavioral disturbance: Secondary | ICD-10-CM | POA: Diagnosis not present

## 2017-10-17 DIAGNOSIS — J209 Acute bronchitis, unspecified: Secondary | ICD-10-CM | POA: Diagnosis not present

## 2017-12-20 DIAGNOSIS — E039 Hypothyroidism, unspecified: Secondary | ICD-10-CM | POA: Diagnosis not present

## 2017-12-20 DIAGNOSIS — J309 Allergic rhinitis, unspecified: Secondary | ICD-10-CM | POA: Diagnosis not present

## 2017-12-20 DIAGNOSIS — F0390 Unspecified dementia without behavioral disturbance: Secondary | ICD-10-CM | POA: Diagnosis not present

## 2017-12-20 DIAGNOSIS — Z23 Encounter for immunization: Secondary | ICD-10-CM | POA: Diagnosis not present

## 2017-12-20 DIAGNOSIS — K219 Gastro-esophageal reflux disease without esophagitis: Secondary | ICD-10-CM | POA: Diagnosis not present

## 2018-01-15 DIAGNOSIS — R319 Hematuria, unspecified: Secondary | ICD-10-CM | POA: Diagnosis not present

## 2018-01-15 DIAGNOSIS — R1032 Left lower quadrant pain: Secondary | ICD-10-CM | POA: Diagnosis not present

## 2018-01-29 ENCOUNTER — Ambulatory Visit
Admission: RE | Admit: 2018-01-29 | Discharge: 2018-01-29 | Disposition: A | Discharge status: Home / Self Care | Payer: PPO | Source: Ambulatory Visit | Attending: Internal Medicine | Admitting: Internal Medicine

## 2018-01-29 ENCOUNTER — Other Ambulatory Visit: Payer: Self-pay | Admitting: Internal Medicine

## 2018-01-29 DIAGNOSIS — M549 Dorsalgia, unspecified: Secondary | ICD-10-CM | POA: Diagnosis not present

## 2018-01-29 DIAGNOSIS — R3129 Other microscopic hematuria: Secondary | ICD-10-CM | POA: Diagnosis not present

## 2018-01-29 DIAGNOSIS — M5136 Other intervertebral disc degeneration, lumbar region: Secondary | ICD-10-CM | POA: Diagnosis not present

## 2018-01-29 DIAGNOSIS — M47816 Spondylosis without myelopathy or radiculopathy, lumbar region: Secondary | ICD-10-CM | POA: Diagnosis not present

## 2018-02-04 ENCOUNTER — Other Ambulatory Visit: Payer: Self-pay | Admitting: Internal Medicine

## 2018-02-04 ENCOUNTER — Ambulatory Visit
Admission: RE | Admit: 2018-02-04 | Discharge: 2018-02-04 | Disposition: A | Discharge status: Home / Self Care | Payer: PPO | Source: Ambulatory Visit | Attending: Internal Medicine | Admitting: Internal Medicine

## 2018-02-04 ENCOUNTER — Other Ambulatory Visit: Payer: PPO

## 2018-02-04 DIAGNOSIS — R310 Gross hematuria: Secondary | ICD-10-CM

## 2018-02-04 DIAGNOSIS — R109 Unspecified abdominal pain: Secondary | ICD-10-CM

## 2018-02-04 DIAGNOSIS — N132 Hydronephrosis with renal and ureteral calculous obstruction: Secondary | ICD-10-CM | POA: Diagnosis not present

## 2018-02-04 DIAGNOSIS — N201 Calculus of ureter: Secondary | ICD-10-CM | POA: Diagnosis not present

## 2018-02-04 DIAGNOSIS — Z7689 Persons encountering health services in other specified circumstances: Secondary | ICD-10-CM | POA: Diagnosis not present

## 2018-02-06 DIAGNOSIS — N201 Calculus of ureter: Secondary | ICD-10-CM | POA: Diagnosis not present

## 2018-02-20 DIAGNOSIS — N201 Calculus of ureter: Secondary | ICD-10-CM | POA: Diagnosis not present

## 2018-03-06 DIAGNOSIS — N201 Calculus of ureter: Secondary | ICD-10-CM | POA: Diagnosis not present

## 2018-04-23 DIAGNOSIS — Z1389 Encounter for screening for other disorder: Secondary | ICD-10-CM | POA: Diagnosis not present

## 2018-04-23 DIAGNOSIS — Z23 Encounter for immunization: Secondary | ICD-10-CM | POA: Diagnosis not present

## 2018-04-23 DIAGNOSIS — K589 Irritable bowel syndrome without diarrhea: Secondary | ICD-10-CM | POA: Diagnosis not present

## 2018-04-23 DIAGNOSIS — M1990 Unspecified osteoarthritis, unspecified site: Secondary | ICD-10-CM | POA: Diagnosis not present

## 2018-04-23 DIAGNOSIS — Z0000 Encounter for general adult medical examination without abnormal findings: Secondary | ICD-10-CM | POA: Diagnosis not present

## 2018-04-23 DIAGNOSIS — N211 Calculus in urethra: Secondary | ICD-10-CM | POA: Diagnosis not present

## 2018-04-23 DIAGNOSIS — E039 Hypothyroidism, unspecified: Secondary | ICD-10-CM | POA: Diagnosis not present

## 2018-04-23 DIAGNOSIS — I700 Atherosclerosis of aorta: Secondary | ICD-10-CM | POA: Diagnosis not present

## 2018-04-23 DIAGNOSIS — N400 Enlarged prostate without lower urinary tract symptoms: Secondary | ICD-10-CM | POA: Diagnosis not present

## 2018-04-23 DIAGNOSIS — J309 Allergic rhinitis, unspecified: Secondary | ICD-10-CM | POA: Diagnosis not present

## 2018-04-23 DIAGNOSIS — K219 Gastro-esophageal reflux disease without esophagitis: Secondary | ICD-10-CM | POA: Diagnosis not present

## 2018-04-23 DIAGNOSIS — D72819 Decreased white blood cell count, unspecified: Secondary | ICD-10-CM | POA: Diagnosis not present

## 2018-04-23 DIAGNOSIS — J449 Chronic obstructive pulmonary disease, unspecified: Secondary | ICD-10-CM | POA: Diagnosis not present

## 2018-04-23 DIAGNOSIS — M5136 Other intervertebral disc degeneration, lumbar region: Secondary | ICD-10-CM | POA: Diagnosis not present

## 2018-05-14 DIAGNOSIS — H524 Presbyopia: Secondary | ICD-10-CM | POA: Diagnosis not present

## 2018-05-14 DIAGNOSIS — H40002 Preglaucoma, unspecified, left eye: Secondary | ICD-10-CM | POA: Diagnosis not present

## 2018-05-14 DIAGNOSIS — H2513 Age-related nuclear cataract, bilateral: Secondary | ICD-10-CM | POA: Diagnosis not present

## 2018-05-14 DIAGNOSIS — H04123 Dry eye syndrome of bilateral lacrimal glands: Secondary | ICD-10-CM | POA: Diagnosis not present

## 2018-08-19 DIAGNOSIS — R1031 Right lower quadrant pain: Secondary | ICD-10-CM | POA: Diagnosis not present

## 2018-09-03 DIAGNOSIS — B372 Candidiasis of skin and nail: Secondary | ICD-10-CM | POA: Diagnosis not present

## 2018-09-19 DIAGNOSIS — L819 Disorder of pigmentation, unspecified: Secondary | ICD-10-CM | POA: Diagnosis not present

## 2018-09-19 DIAGNOSIS — L249 Irritant contact dermatitis, unspecified cause: Secondary | ICD-10-CM | POA: Diagnosis not present

## 2018-09-19 DIAGNOSIS — L304 Erythema intertrigo: Secondary | ICD-10-CM | POA: Diagnosis not present

## 2018-10-08 DIAGNOSIS — R21 Rash and other nonspecific skin eruption: Secondary | ICD-10-CM | POA: Diagnosis not present

## 2018-10-28 DIAGNOSIS — R21 Rash and other nonspecific skin eruption: Secondary | ICD-10-CM | POA: Diagnosis not present

## 2018-11-20 DIAGNOSIS — F0390 Unspecified dementia without behavioral disturbance: Secondary | ICD-10-CM | POA: Diagnosis not present

## 2018-11-20 DIAGNOSIS — M5136 Other intervertebral disc degeneration, lumbar region: Secondary | ICD-10-CM | POA: Diagnosis not present

## 2018-11-20 DIAGNOSIS — Z23 Encounter for immunization: Secondary | ICD-10-CM | POA: Diagnosis not present

## 2018-11-20 DIAGNOSIS — J449 Chronic obstructive pulmonary disease, unspecified: Secondary | ICD-10-CM | POA: Diagnosis not present

## 2018-11-20 DIAGNOSIS — N400 Enlarged prostate without lower urinary tract symptoms: Secondary | ICD-10-CM | POA: Diagnosis not present

## 2018-11-20 DIAGNOSIS — M1990 Unspecified osteoarthritis, unspecified site: Secondary | ICD-10-CM | POA: Diagnosis not present

## 2018-11-20 DIAGNOSIS — E039 Hypothyroidism, unspecified: Secondary | ICD-10-CM | POA: Diagnosis not present

## 2018-11-20 DIAGNOSIS — I700 Atherosclerosis of aorta: Secondary | ICD-10-CM | POA: Diagnosis not present

## 2018-11-20 DIAGNOSIS — K219 Gastro-esophageal reflux disease without esophagitis: Secondary | ICD-10-CM | POA: Diagnosis not present

## 2019-02-04 ENCOUNTER — Other Ambulatory Visit: Payer: Self-pay

## 2019-02-04 DIAGNOSIS — Z20822 Contact with and (suspected) exposure to covid-19: Secondary | ICD-10-CM

## 2019-02-06 LAB — NOVEL CORONAVIRUS, NAA: SARS-CoV-2, NAA: NOT DETECTED

## 2019-04-09 DIAGNOSIS — N201 Calculus of ureter: Secondary | ICD-10-CM | POA: Diagnosis not present

## 2019-04-28 ENCOUNTER — Ambulatory Visit: Payer: PPO | Attending: Internal Medicine

## 2019-04-28 DIAGNOSIS — Z23 Encounter for immunization: Secondary | ICD-10-CM | POA: Insufficient documentation

## 2019-04-28 NOTE — Progress Notes (Signed)
   Covid-19 Vaccination Clinic  Name:  Anthony Ball    MRN: [id number] DOB: 10-27-1934  04/28/2019  [name] was observed post Covid-19 immunization for 15 minutes without incidence. He was provided with Vaccine Information Sheet and instruction to access the V-Safe system.   [name] was instructed to call 911 with any severe reactions post vaccine: Marland Kitchen Difficulty breathing  . Swelling of your face and throat  . A fast heartbeat  . A bad rash all over your body  . Dizziness and weakness    Immunizations Administered    Name Date Dose VIS Date Route   Pfizer COVID-19 Vaccine 04/28/2019  1:10 PM 0.3 mL 02/28/2019 Intramuscular   Manufacturer: [employer], [employer]   Lot: [id number]   NDC: [address]1

## 2019-05-22 ENCOUNTER — Ambulatory Visit: Payer: PPO | Attending: Internal Medicine

## 2019-05-22 DIAGNOSIS — Z23 Encounter for immunization: Secondary | ICD-10-CM | POA: Insufficient documentation

## 2019-05-22 IMAGING — CT CT ABD-PELV W/O CM
2 of 4 series · 16 of 46 positions shown, 18 images · non-contrast
Comparison: None.

CLINICAL DATA: Acute left flank pain, severe last night. Left lower
quadrant pain. History of prostate surgery.

EXAM:
CT ABDOMEN AND PELVIS WITHOUT CONTRAST
TECHNIQUE: Multidetector CT imaging of the abdomen and pelvis was performed
following the standard protocol without IV contrast.

[Series 2: renal stone 5.00 br40 s3 ax · axial · 0.70mm/px · z∈[+1239,+1599]mm · 13 of 80 slices shown, 15 images]
[im 4/80  soft-tissue]
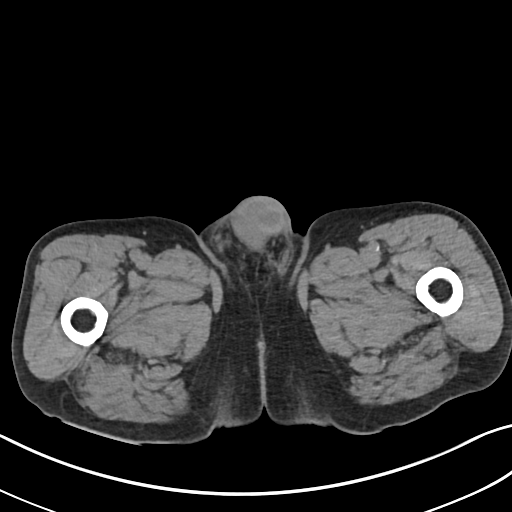
[im 4/80  bone]
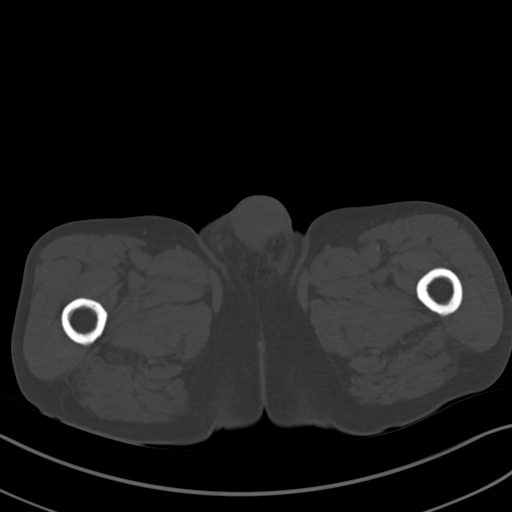
[im 10/80  soft-tissue]
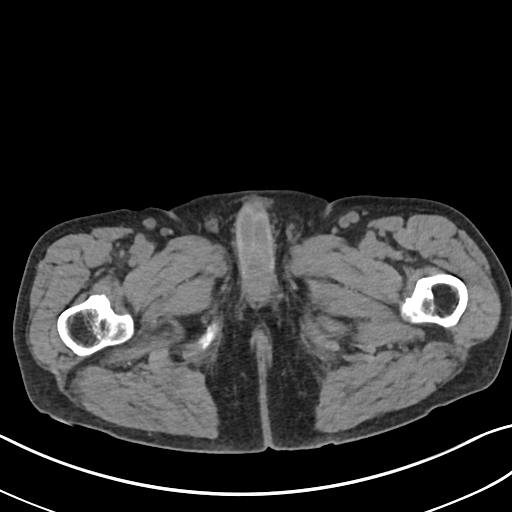
[im 17/80  soft-tissue]
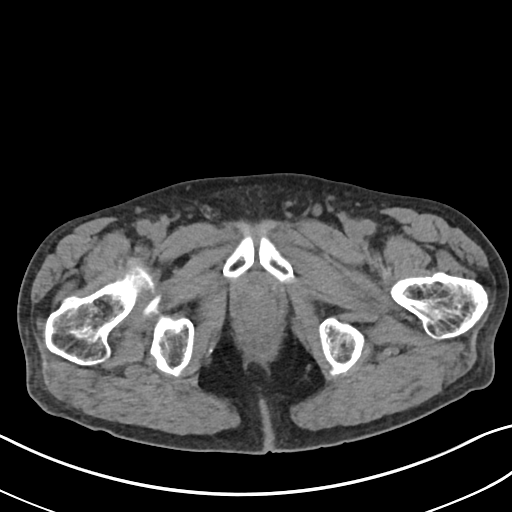
[im 24/80  soft-tissue]
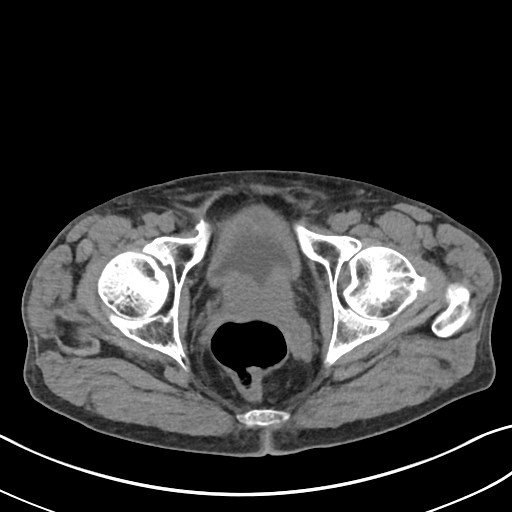
[im 27/80  soft-tissue]
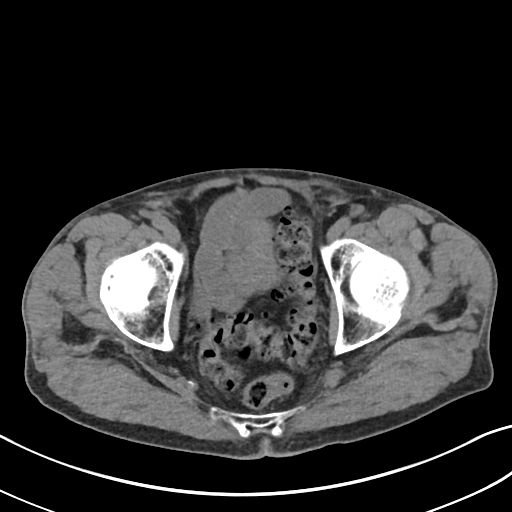
[im 33/80  soft-tissue]
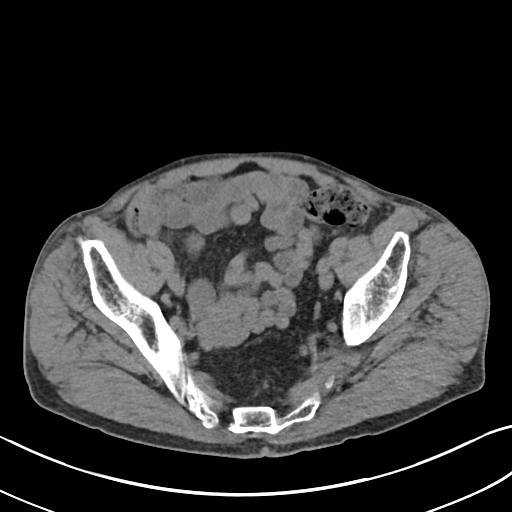
[im 40/80  soft-tissue]
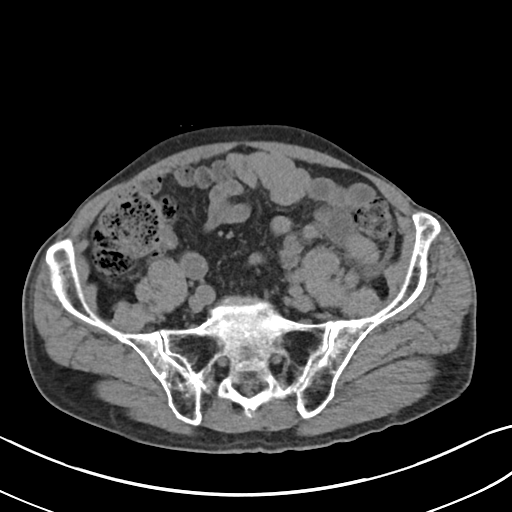
[im 47/80  soft-tissue]
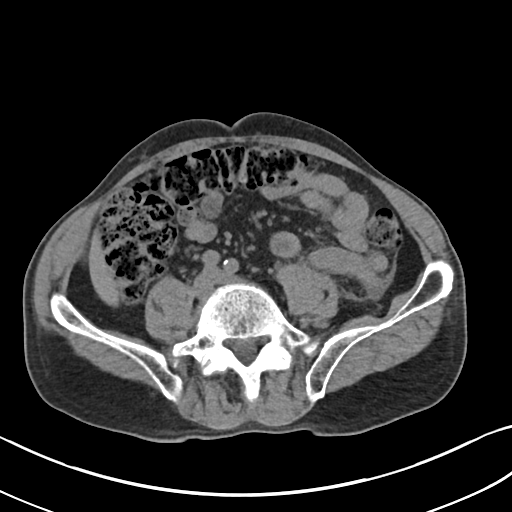
[im 53/80  soft-tissue]
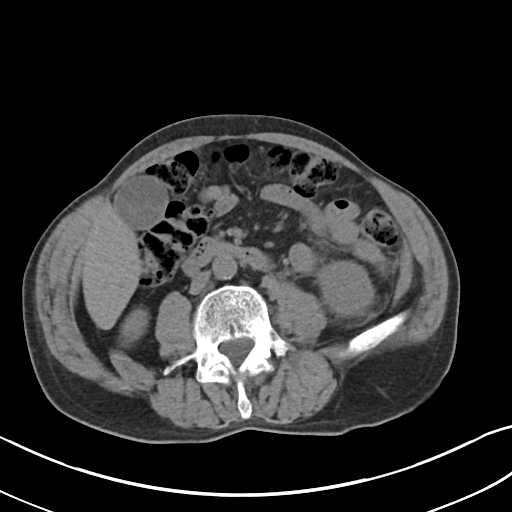
[im 53/80  bone]
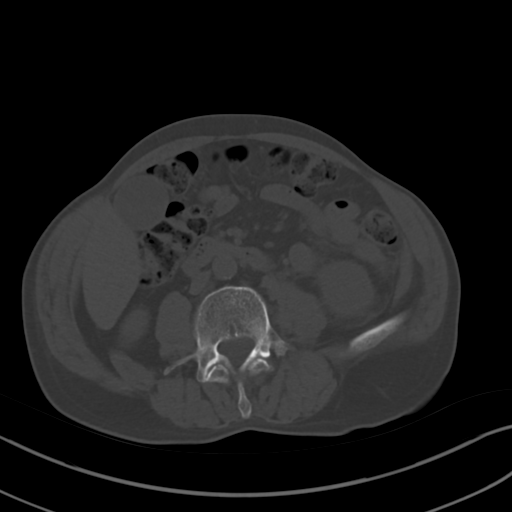
[im 56/80  soft-tissue]
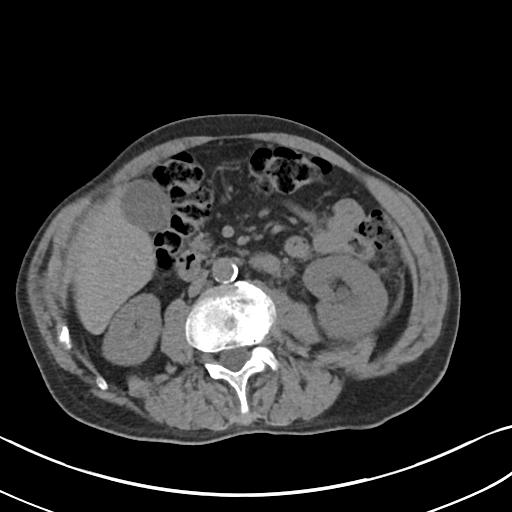
[im 63/80  soft-tissue]
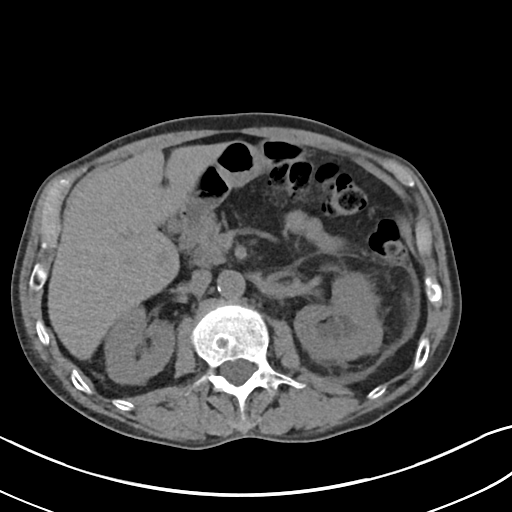
[im 70/80  soft-tissue]
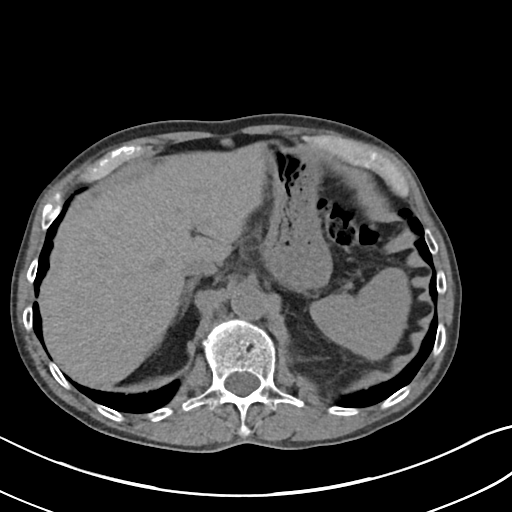
[im 76/80  soft-tissue]
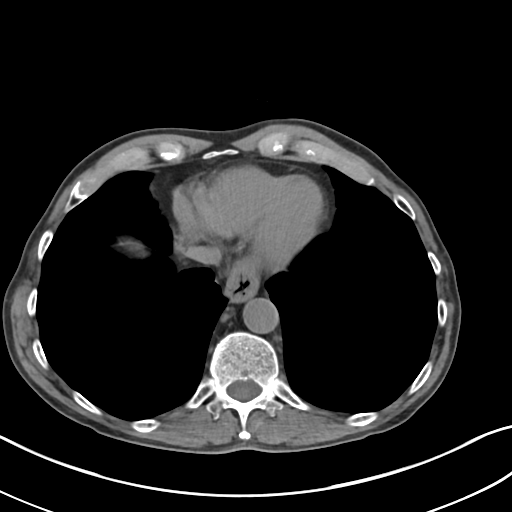

[Series 6: renal stone 2.00 br40 s3 cor · coronal · 0.70mm/px · 3 of 142 slices shown]
[im 48/142  soft-tissue]
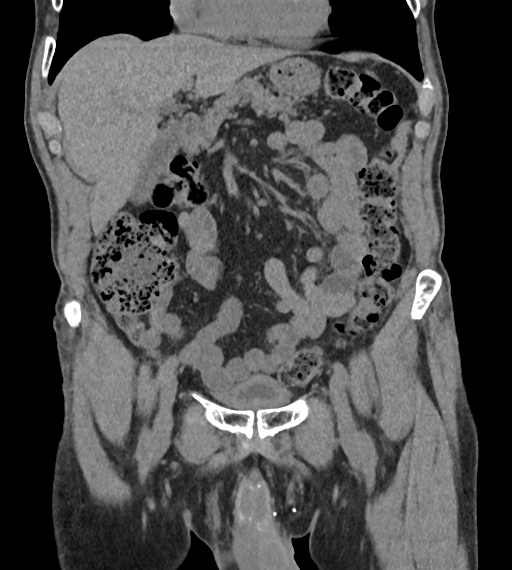
[im 63/142  soft-tissue]
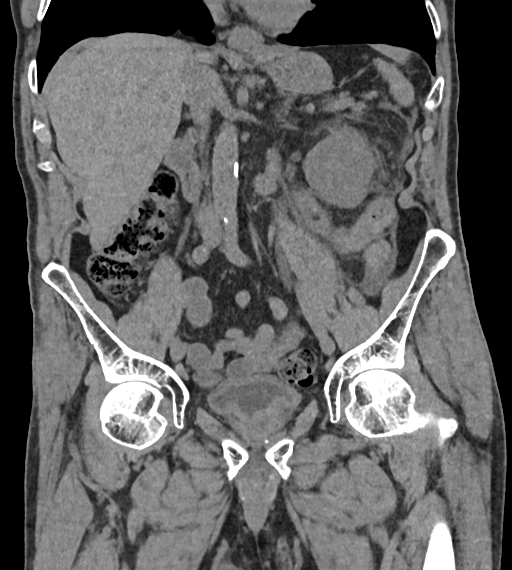
[im 79/142  soft-tissue]
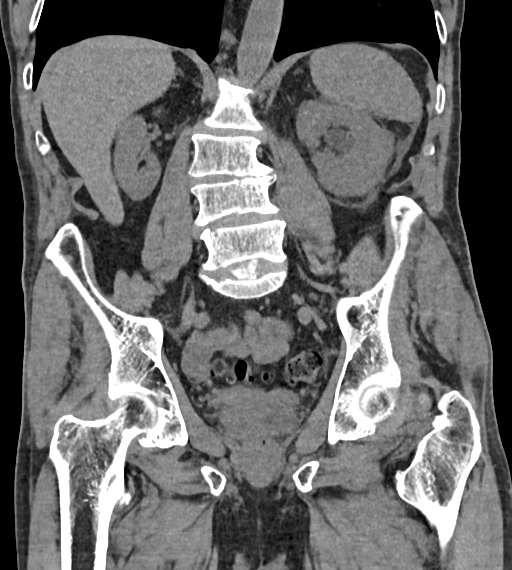

[16 of 46 positions shown; findings below may reference images not displayed]

FINDINGS: Lower chest: Probable mild emphysematous changes at both lung bases
and a small hiatal hernia. No significant pleural or pericardial
effusion.

Hepatobiliary: The liver appears unremarkable as imaged in the
noncontrast state. No evidence of gallstones, gallbladder wall
thickening or biliary dilatation.

Pancreas: Unremarkable. No pancreatic ductal dilatation or
surrounding inflammatory changes.

Spleen: Normal in size without focal abnormality.

Adrenals/Urinary Tract: Both adrenal glands appear normal. The right
kidney appears normal. There are 2 nonobstructing calculi in the
lower pole of the left kidney, measuring up to 5 mm in diameter.
Within the mid left ureter, there is a 5 mm calculus (image 45/2).
This overlaps the lower sacroiliac joint on the scout image and is
not well visualized. There is mild left-sided hydronephrosis and
asymmetric perinephric soft tissue stranding. Distally, the ureters
appear normal. No bladder abnormalities are seen.

Stomach/Bowel: No evidence of bowel wall thickening, distention or
surrounding inflammatory change. The appendix appears normal. There
are mild sigmoid colon diverticular changes.

Vascular/Lymphatic: There are no enlarged abdominal or pelvic lymph
nodes. Mild aortic and branch vessel atherosclerosis.

Reproductive: Moderate enlargement of the prostate gland. Central
prostate defect, likely from previous TURP.

Other: No evidence of abdominal wall mass or hernia. No ascites.

Musculoskeletal: No acute or significant osseous findings. There is
a convex right lumbar scoliosis with associated spondylosis.
IMPRESSION: 1. Obstructing 5 mm calculus in the mid left ureter.
2. Nonobstructing left renal calculi.
3. Incidental findings including a small hiatal hernia, sigmoid
diverticulosis, probable TUR defect in the prostate gland, lumbar
spondylosis, aortic Atherosclerosis ([id number]) and Emphysema
([id number]).

## 2019-05-22 NOTE — Progress Notes (Signed)
   Covid-19 Vaccination Clinic  Name:  Anthony Ball    MRN: [id number] DOB: June 29, 1934  05/22/2019  [name] was observed post Covid-19 immunization for 15 minutes without incident. He was provided with Vaccine Information Sheet and instruction to access the V-Safe system.   [name] was instructed to call 911 with any severe reactions post vaccine: Marland Kitchen Difficulty breathing  . Swelling of face and throat  . A fast heartbeat  . A bad rash all over body  . Dizziness and weakness   Immunizations Administered    Name Date Dose VIS Date Route   Pfizer COVID-19 Vaccine 05/22/2019  6:27 PM 0.3 mL 02/28/2019 Intramuscular   Manufacturer: [employer], [employer]   Lot: [id number]   NDC: [address]-9

## 2019-06-04 DIAGNOSIS — H25812 Combined forms of age-related cataract, left eye: Secondary | ICD-10-CM | POA: Diagnosis not present

## 2019-06-04 DIAGNOSIS — H25811 Combined forms of age-related cataract, right eye: Secondary | ICD-10-CM | POA: Diagnosis not present

## 2019-06-04 DIAGNOSIS — H524 Presbyopia: Secondary | ICD-10-CM | POA: Diagnosis not present

## 2019-06-04 DIAGNOSIS — H40012 Open angle with borderline findings, low risk, left eye: Secondary | ICD-10-CM | POA: Diagnosis not present

## 2019-07-09 DIAGNOSIS — F0390 Unspecified dementia without behavioral disturbance: Secondary | ICD-10-CM | POA: Diagnosis not present

## 2019-07-09 DIAGNOSIS — Z0000 Encounter for general adult medical examination without abnormal findings: Secondary | ICD-10-CM | POA: Diagnosis not present

## 2019-07-09 DIAGNOSIS — K219 Gastro-esophageal reflux disease without esophagitis: Secondary | ICD-10-CM | POA: Diagnosis not present

## 2019-07-09 DIAGNOSIS — E039 Hypothyroidism, unspecified: Secondary | ICD-10-CM | POA: Diagnosis not present

## 2019-07-09 DIAGNOSIS — J449 Chronic obstructive pulmonary disease, unspecified: Secondary | ICD-10-CM | POA: Diagnosis not present

## 2019-07-09 DIAGNOSIS — Z7189 Other specified counseling: Secondary | ICD-10-CM | POA: Diagnosis not present

## 2019-07-09 DIAGNOSIS — I700 Atherosclerosis of aorta: Secondary | ICD-10-CM | POA: Diagnosis not present

## 2019-07-09 DIAGNOSIS — Z1389 Encounter for screening for other disorder: Secondary | ICD-10-CM | POA: Diagnosis not present

## 2019-07-09 DIAGNOSIS — N400 Enlarged prostate without lower urinary tract symptoms: Secondary | ICD-10-CM | POA: Diagnosis not present

## 2019-07-09 DIAGNOSIS — F419 Anxiety disorder, unspecified: Secondary | ICD-10-CM | POA: Diagnosis not present

## 2019-07-09 DIAGNOSIS — M5136 Other intervertebral disc degeneration, lumbar region: Secondary | ICD-10-CM | POA: Diagnosis not present

## 2020-01-13 DIAGNOSIS — K219 Gastro-esophageal reflux disease without esophagitis: Secondary | ICD-10-CM | POA: Diagnosis not present

## 2020-01-13 DIAGNOSIS — F419 Anxiety disorder, unspecified: Secondary | ICD-10-CM | POA: Diagnosis not present

## 2020-01-13 DIAGNOSIS — I700 Atherosclerosis of aorta: Secondary | ICD-10-CM | POA: Diagnosis not present

## 2020-01-13 DIAGNOSIS — F0390 Unspecified dementia without behavioral disturbance: Secondary | ICD-10-CM | POA: Diagnosis not present

## 2020-01-13 DIAGNOSIS — E039 Hypothyroidism, unspecified: Secondary | ICD-10-CM | POA: Diagnosis not present

## 2020-01-13 DIAGNOSIS — J449 Chronic obstructive pulmonary disease, unspecified: Secondary | ICD-10-CM | POA: Diagnosis not present

## 2020-05-26 ENCOUNTER — Telehealth: Payer: Self-pay

## 2020-05-26 NOTE — Telephone Encounter (Signed)
Pt walked in to [facility] and said he had consistent mid dull chest pain for at least one week; some SOB upon exertion. Pt does not appear in distress but pt said "he feels uneasy". Pt drove himself to [facility] but said he only lives 2-3 miles from here; I offered to call 911 or call family member or friend to take pt to [facility] or ED. Pt said no he could not do that but would make provisions to be seen by tomorrow. Pt said he called Dr Venita Sheffield office but Dr Donette Larry did not have appt and pt was offered and appt with another provider but pt said he only wanted to see Dr Donette Larry. I spoke with Angelica Chessman RN and Carollee Herter RN and again advised pt our best recommendation for pt to be seen today and offered to call anyone he wanted me to call but pt said he felt under better control now and he was going home now. Pt knew where [facility] ED and [facility] was located (gave [place] clinic phone # and address) and advised open until 7PM. Pt said he was aware he was taking a chance to drive home and not to see a doctor today but he thought he could make provisions to be seen by 05/27/20. UC & ED precautions given and pt voiced understanding. Sending not to Dr Donette Larry and Angelica Chessman RN admin for [facility].

## 2020-05-26 NOTE — Telephone Encounter (Signed)
Noted, thank you Rena.  Will defer to patient's PCP.

## 2020-05-27 DIAGNOSIS — R002 Palpitations: Secondary | ICD-10-CM | POA: Diagnosis not present

## 2020-07-02 ENCOUNTER — Encounter: Payer: Self-pay | Admitting: Emergency Medicine

## 2020-07-02 ENCOUNTER — Other Ambulatory Visit: Payer: Self-pay

## 2020-07-02 ENCOUNTER — Emergency Department
Admission: EM | Admit: 2020-07-02 | Discharge: 2020-07-02 | Disposition: A | Discharge status: Home / Self Care | Payer: Medicare HMO | Attending: Emergency Medicine | Admitting: Emergency Medicine

## 2020-07-02 DIAGNOSIS — S0501XA Injury of conjunctiva and corneal abrasion without foreign body, right eye, initial encounter: Secondary | ICD-10-CM | POA: Diagnosis not present

## 2020-07-02 DIAGNOSIS — J449 Chronic obstructive pulmonary disease, unspecified: Secondary | ICD-10-CM | POA: Insufficient documentation

## 2020-07-02 DIAGNOSIS — X58XXXA Exposure to other specified factors, initial encounter: Secondary | ICD-10-CM | POA: Insufficient documentation

## 2020-07-02 DIAGNOSIS — Y9389 Activity, other specified: Secondary | ICD-10-CM | POA: Insufficient documentation

## 2020-07-02 DIAGNOSIS — Z79899 Other long term (current) drug therapy: Secondary | ICD-10-CM | POA: Diagnosis not present

## 2020-07-02 DIAGNOSIS — S0591XA Unspecified injury of right eye and orbit, initial encounter: Secondary | ICD-10-CM | POA: Diagnosis present

## 2020-07-02 MED ORDER — FLUORESCEIN SODIUM 1 MG OP STRP
1.0000 | ORAL_STRIP | Freq: Once | OPHTHALMIC | Status: AC
  Administered 2020-07-02: 1 via OPHTHALMIC
  Filled 2020-07-02: qty 1

## 2020-07-02 MED ORDER — TETRACAINE HCL 0.5 % OP SOLN
2.0000 [drp] | Freq: Once | OPHTHALMIC | Status: AC
  Administered 2020-07-02: 2 [drp] via OPHTHALMIC
  Filled 2020-07-02: qty 4

## 2020-07-02 MED ORDER — POLYMYXIN B-TRIMETHOPRIM 10000-0.1 UNIT/ML-% OP SOLN: 2.0000 [drp] | Freq: Four times a day (QID) | OPHTHALMIC | 0 refills | Status: DC

## 2020-07-02 MED ORDER — KETOROLAC TROMETHAMINE 0.5 % OP SOLN: 1.0000 [drp] | Freq: Four times a day (QID) | OPHTHALMIC | 0 refills | Status: DC

## 2020-07-02 NOTE — ED Notes (Signed)
Patient reports working outside yesterday, and feeling something in his eye afterwards. Patient reports trying to flush his eye with eye drops today, without success. Patient reports irritation, but denies blurred vision, or vision changes. Patient reports he does wear corrective lenses. Patient is alert, oriented x4, albeit hard of hearing, and denies any AMS, or neurological changes. Patient ambulatory with steady gait NADN.

## 2020-07-02 NOTE — ED Notes (Signed)
ED Provider at bedside. 

## 2020-07-02 NOTE — ED Provider Notes (Signed)
[facility] Emergency Department Provider Note  ____________________________________________  Time seen: Approximately 6:55 PM  I have reviewed the triage vital signs and the nursing notes.   HISTORY  Chief Complaint Eye Pain    HPI Anthony Ball is a 86 y.o. male who presents the emergency department complaining of right eye pain.  Patient states that he was doing yard work yesterday, felt like something had flown into his eye.  Patient thought he had got what ever it was out, but has had a continued foreign body sensation to the eye.  He wears glasses but no contacts.  He denies any visual changes.  He denies any pain in the eye itself.  No headache.  Patient states that he is used artificial tears and when he puts in the tears it makes the symptoms better, but then it returns after some time.  Patient states that he feels like he probably should go see ophthalmology as he needs a new reading glasses prescription.  He has bifocals and would like new glasses.         Past Medical History:  Diagnosis Date  . COPD (chronic obstructive pulmonary disease) (HCC)   . Emphysema   . GERD (gastroesophageal reflux disease)   . Seasonal allergies     There are no problems to display for this patient.   Past Surgical History:  Procedure Laterality Date  . CARDIAC CATHETERIZATION    . PROSTATE SURGERY      Prior to Admission medications   Medication Sig Start Date End Date Taking? Authorizing Provider  ketorolac (ACULAR) 0.5 % ophthalmic solution Place 1 drop into the right eye 4 (four) times daily. 07/02/20  Yes Neal Trulson, Delorise Royals, PA-C  trimethoprim-polymyxin b (POLYTRIM) ophthalmic solution Place 2 drops into the right eye every 6 (six) hours. 07/02/20  Yes Tayon Parekh, Delorise Royals, PA-C  albuterol (PROVENTIL HFA;VENTOLIN HFA) 108 (90 BASE) MCG/ACT inhaler Inhale 2 puffs into the lungs every 4 (four) hours as needed for wheezing or shortness of breath. 04/13/12    Eber Hong, MD  HYDROcodone-acetaminophen (NORCO/VICODIN) 5-325 MG per tablet Take 1-2 tablets by mouth every 6 hours as needed for pain. 07/21/12   Pisciotta, Joni Reining, PA-C  omeprazole (PRILOSEC) 20 MG capsule Take 20 mg by mouth 2 (two) times daily.     [provider]  triamcinolone cream (KENALOG) 0.1 % Apply 1 application topically every other day. Apply to great areas of body    [provider]    Allergies Patient has no known allergies.  No family history on file.  Social History Social History   Tobacco Use  . Smoking status: Never Smoker  Substance Use Topics  . Alcohol use: No  . Drug use: No     Review of Systems  Constitutional: No fever/chills Eyes: No visual changes. No discharge ENT: Foreign body sensation to the right eye Cardiovascular: no chest pain. Respiratory: no cough. No SOB. Gastrointestinal: No abdominal pain.  No nausea, no vomiting.  No diarrhea.  No constipation. Musculoskeletal: Negative for musculoskeletal pain. Skin: Negative for rash, abrasions, lacerations, ecchymosis. Neurological: Negative for headaches, focal weakness or numbness.  10 System ROS otherwise negative.  ____________________________________________   PHYSICAL EXAM:  VITAL SIGNS: ED Triage Vitals  Enc Vitals Group     BP 07/02/20 1852 135/67     Pulse Rate 07/02/20 1852 61     Resp 07/02/20 1852 20     Temp 07/02/20 1852 98.5 F (36.9 C)  Temp Source 07/02/20 1852 Oral     SpO2 07/02/20 1852 96 %     Weight 07/02/20 1853 138 lb (62.6 kg)     Height 07/02/20 1853 5\' 5"  (1.651 m)     Head Circumference --      Peak Flow --      Pain Score 07/02/20 1853 0     Pain Loc --      Pain Edu? --      Excl. in GC? --      Constitutional: Alert and oriented. Well appearing and in no acute distress. Eyes: Conjunctivae are normal with no evidence of subconjunctival hemorrhage.07/04/20 PERRL. EOMI. funduscopic exam reveals red reflex bilaterally.  Vasculature  and optic disc is unremarkable bilaterally.  Funduscopic exam reveals no evidence of foreign body to the right eye.  Right eye is anesthetized using tetracaine.  Fluorescein staining applied with a small area of uptake along the 4 o'clock position over the border of the iris extending into the conjunctival surface.  No identified foreign body. Head: Atraumatic. ENT:      Ears:       Nose: No congestion/rhinnorhea.      Mouth/Throat: Mucous membranes are moist.  Neck: No stridor.  Cardiovascular: Normal rate, regular rhythm. Normal S1 and S2.  Good peripheral circulation. Respiratory: Normal respiratory effort without tachypnea or retractions. Lungs CTAB. Good air entry to the bases with no decreased or absent breath sounds. Musculoskeletal: Full range of motion to all extremities. No gross deformities appreciated. Neurologic:  Normal speech and language. No gross focal neurologic deficits are appreciated.  Skin:  Skin is warm, dry and intact. No rash noted. Psychiatric: Mood and affect are normal. Speech and behavior are normal. Patient exhibits appropriate insight and judgement.   ____________________________________________   LABS (all labs ordered are listed, but only abnormal results are displayed)  Labs Reviewed - No data to display ____________________________________________  EKG   ____________________________________________  RADIOLOGY   No results found.  ____________________________________________    PROCEDURES  Procedure(s) performed:    Procedures    Medications  fluorescein ophthalmic strip 1 strip (1 strip Right Eye Given by Other 07/02/20 1903)  tetracaine (PONTOCAINE) 0.5 % ophthalmic solution 2 drop (2 drops Right Eye Given by Other 07/02/20 1904)     ____________________________________________   INITIAL IMPRESSION / ASSESSMENT AND PLAN / ED COURSE  Pertinent labs & imaging results that were available during my care of the patient were  reviewed by me and considered in my medical decision making (see chart for details).  Review of the [place] CSRS was performed in accordance of the [facility] prior to dispensing any controlled drugs.           Patient's diagnosis is consistent with corneal abrasion.  Patient presented to the emergency department with right eye pain.  He describes this as a foreign body sensation.  He states he was doing yard work yesterday when he felt something fly into his eye.  Patient is unsure what this object was.  He thought he had removed it using artificial tears but has ongoing foreign body sensation.  No visualized foreign body on exam.  Small corneal abrasion to the 4 o'clock position of the eye.  Tetracaine drops relieved all symptoms.  At this time there was no significant visual changes.  Patient denied any true intraocular pain.  No headache.  This time low suspicion for any other etiology of patient's symptoms other than visualized corneal abrasion.  Patient will be  placed on Acular and Polytrim.  Patient may follow-up with ophthalmology as needed.  Return precautions discussed with the patient..  Patient is given ED precautions to return to the ED for any worsening or new symptoms.     ____________________________________________  FINAL CLINICAL IMPRESSION(S) / ED DIAGNOSES  Final diagnoses:  Abrasion of right cornea, initial encounter      NEW MEDICATIONS STARTED DURING THIS VISIT:  ED Discharge Orders         Ordered    trimethoprim-polymyxin b (POLYTRIM) ophthalmic solution  Every 6 hours        07/02/20 1910    ketorolac (ACULAR) 0.5 % ophthalmic solution  4 times daily        07/02/20 1910              This chart was dictated using voice recognition software/Dragon. Despite best efforts to proofread, errors can occur which can change the meaning. Any change was purely unintentional.    Racheal Patches, PA-C 07/02/20 1911    Phineas Semen, MD 07/02/20 [phone]

## 2020-07-02 NOTE — ED Triage Notes (Signed)
Pt reports right eye discomfort since yesterday evening, does not recall doing anything that could have cause the irritation. Pt denies any other symptom, talk sin complete sentences no distress noted

## 2020-08-24 DIAGNOSIS — E039 Hypothyroidism, unspecified: Secondary | ICD-10-CM | POA: Diagnosis not present

## 2020-08-24 DIAGNOSIS — F0390 Unspecified dementia without behavioral disturbance: Secondary | ICD-10-CM | POA: Diagnosis not present

## 2020-08-24 DIAGNOSIS — K219 Gastro-esophageal reflux disease without esophagitis: Secondary | ICD-10-CM | POA: Diagnosis not present

## 2020-08-24 DIAGNOSIS — F419 Anxiety disorder, unspecified: Secondary | ICD-10-CM | POA: Diagnosis not present

## 2020-08-24 DIAGNOSIS — I700 Atherosclerosis of aorta: Secondary | ICD-10-CM | POA: Diagnosis not present

## 2020-08-24 DIAGNOSIS — J449 Chronic obstructive pulmonary disease, unspecified: Secondary | ICD-10-CM | POA: Diagnosis not present

## 2020-08-24 DIAGNOSIS — R0981 Nasal congestion: Secondary | ICD-10-CM | POA: Diagnosis not present

## 2020-08-24 DIAGNOSIS — M1990 Unspecified osteoarthritis, unspecified site: Secondary | ICD-10-CM | POA: Diagnosis not present

## 2020-08-24 DIAGNOSIS — J309 Allergic rhinitis, unspecified: Secondary | ICD-10-CM | POA: Diagnosis not present

## 2020-08-27 DIAGNOSIS — Z03818 Encounter for observation for suspected exposure to other biological agents ruled out: Secondary | ICD-10-CM | POA: Diagnosis not present

## 2020-08-27 DIAGNOSIS — R059 Cough, unspecified: Secondary | ICD-10-CM | POA: Diagnosis not present

## 2020-11-24 DIAGNOSIS — J309 Allergic rhinitis, unspecified: Secondary | ICD-10-CM | POA: Diagnosis not present

## 2020-11-24 DIAGNOSIS — M1990 Unspecified osteoarthritis, unspecified site: Secondary | ICD-10-CM | POA: Diagnosis not present

## 2020-11-24 DIAGNOSIS — J449 Chronic obstructive pulmonary disease, unspecified: Secondary | ICD-10-CM | POA: Diagnosis not present

## 2020-11-24 DIAGNOSIS — I700 Atherosclerosis of aorta: Secondary | ICD-10-CM | POA: Diagnosis not present

## 2020-11-24 DIAGNOSIS — N400 Enlarged prostate without lower urinary tract symptoms: Secondary | ICD-10-CM | POA: Diagnosis not present

## 2020-11-24 DIAGNOSIS — K219 Gastro-esophageal reflux disease without esophagitis: Secondary | ICD-10-CM | POA: Diagnosis not present

## 2020-11-24 DIAGNOSIS — F0390 Unspecified dementia without behavioral disturbance: Secondary | ICD-10-CM | POA: Diagnosis not present

## 2020-11-24 DIAGNOSIS — E039 Hypothyroidism, unspecified: Secondary | ICD-10-CM | POA: Diagnosis not present

## 2020-11-24 DIAGNOSIS — Z23 Encounter for immunization: Secondary | ICD-10-CM | POA: Diagnosis not present

## 2021-02-22 DIAGNOSIS — J309 Allergic rhinitis, unspecified: Secondary | ICD-10-CM | POA: Diagnosis not present

## 2021-02-22 DIAGNOSIS — R21 Rash and other nonspecific skin eruption: Secondary | ICD-10-CM | POA: Diagnosis not present

## 2021-02-22 DIAGNOSIS — J449 Chronic obstructive pulmonary disease, unspecified: Secondary | ICD-10-CM | POA: Diagnosis not present

## 2021-02-22 DIAGNOSIS — F0390 Unspecified dementia without behavioral disturbance: Secondary | ICD-10-CM | POA: Diagnosis not present

## 2021-02-22 DIAGNOSIS — E039 Hypothyroidism, unspecified: Secondary | ICD-10-CM | POA: Diagnosis not present

## 2021-02-22 DIAGNOSIS — N400 Enlarged prostate without lower urinary tract symptoms: Secondary | ICD-10-CM | POA: Diagnosis not present

## 2021-02-22 DIAGNOSIS — I700 Atherosclerosis of aorta: Secondary | ICD-10-CM | POA: Diagnosis not present

## 2021-02-22 DIAGNOSIS — K219 Gastro-esophageal reflux disease without esophagitis: Secondary | ICD-10-CM | POA: Diagnosis not present

## 2021-07-04 ENCOUNTER — Ambulatory Visit
Admission: RE | Admit: 2021-07-04 | Discharge: 2021-07-04 | Disposition: A | Discharge status: Home / Self Care | Payer: Medicare HMO | Source: Ambulatory Visit | Attending: Internal Medicine | Admitting: Internal Medicine

## 2021-07-04 ENCOUNTER — Other Ambulatory Visit: Payer: Self-pay | Admitting: Internal Medicine

## 2021-07-04 DIAGNOSIS — M5136 Other intervertebral disc degeneration, lumbar region: Secondary | ICD-10-CM

## 2021-07-04 DIAGNOSIS — R109 Unspecified abdominal pain: Secondary | ICD-10-CM | POA: Diagnosis not present

## 2021-07-08 ENCOUNTER — Other Ambulatory Visit: Payer: Self-pay | Admitting: Internal Medicine

## 2021-07-08 DIAGNOSIS — R109 Unspecified abdominal pain: Secondary | ICD-10-CM

## 2021-07-11 DIAGNOSIS — B029 Zoster without complications: Secondary | ICD-10-CM | POA: Diagnosis not present

## 2021-07-14 DIAGNOSIS — E46 Unspecified protein-calorie malnutrition: Secondary | ICD-10-CM | POA: Diagnosis not present

## 2021-07-14 DIAGNOSIS — Z1331 Encounter for screening for depression: Secondary | ICD-10-CM | POA: Diagnosis not present

## 2021-07-14 DIAGNOSIS — J449 Chronic obstructive pulmonary disease, unspecified: Secondary | ICD-10-CM | POA: Diagnosis not present

## 2021-07-14 DIAGNOSIS — N400 Enlarged prostate without lower urinary tract symptoms: Secondary | ICD-10-CM | POA: Diagnosis not present

## 2021-07-14 DIAGNOSIS — I700 Atherosclerosis of aorta: Secondary | ICD-10-CM | POA: Diagnosis not present

## 2021-07-14 DIAGNOSIS — M5136 Other intervertebral disc degeneration, lumbar region: Secondary | ICD-10-CM | POA: Diagnosis not present

## 2021-07-14 DIAGNOSIS — E039 Hypothyroidism, unspecified: Secondary | ICD-10-CM | POA: Diagnosis not present

## 2021-07-14 DIAGNOSIS — Z0000 Encounter for general adult medical examination without abnormal findings: Secondary | ICD-10-CM | POA: Diagnosis not present

## 2021-07-14 DIAGNOSIS — F419 Anxiety disorder, unspecified: Secondary | ICD-10-CM | POA: Diagnosis not present

## 2021-07-14 DIAGNOSIS — K219 Gastro-esophageal reflux disease without esophagitis: Secondary | ICD-10-CM | POA: Diagnosis not present

## 2021-07-14 DIAGNOSIS — F0390 Unspecified dementia without behavioral disturbance: Secondary | ICD-10-CM | POA: Diagnosis not present

## 2021-09-05 DIAGNOSIS — R21 Rash and other nonspecific skin eruption: Secondary | ICD-10-CM | POA: Diagnosis not present

## 2021-09-09 DIAGNOSIS — R350 Frequency of micturition: Secondary | ICD-10-CM | POA: Diagnosis not present

## 2021-11-16 DIAGNOSIS — M5136 Other intervertebral disc degeneration, lumbar region: Secondary | ICD-10-CM | POA: Diagnosis not present

## 2021-11-16 DIAGNOSIS — R21 Rash and other nonspecific skin eruption: Secondary | ICD-10-CM | POA: Diagnosis not present

## 2021-11-16 DIAGNOSIS — J449 Chronic obstructive pulmonary disease, unspecified: Secondary | ICD-10-CM | POA: Diagnosis not present

## 2021-11-16 DIAGNOSIS — E039 Hypothyroidism, unspecified: Secondary | ICD-10-CM | POA: Diagnosis not present

## 2021-11-16 DIAGNOSIS — F0390 Unspecified dementia without behavioral disturbance: Secondary | ICD-10-CM | POA: Diagnosis not present

## 2021-11-16 DIAGNOSIS — I700 Atherosclerosis of aorta: Secondary | ICD-10-CM | POA: Diagnosis not present

## 2021-11-16 DIAGNOSIS — E46 Unspecified protein-calorie malnutrition: Secondary | ICD-10-CM | POA: Diagnosis not present

## 2021-11-16 DIAGNOSIS — F419 Anxiety disorder, unspecified: Secondary | ICD-10-CM | POA: Diagnosis not present

## 2021-11-16 DIAGNOSIS — N401 Enlarged prostate with lower urinary tract symptoms: Secondary | ICD-10-CM | POA: Diagnosis not present

## 2021-12-27 DIAGNOSIS — H524 Presbyopia: Secondary | ICD-10-CM | POA: Diagnosis not present

## 2021-12-29 DIAGNOSIS — Z0100 Encounter for examination of eyes and vision without abnormal findings: Secondary | ICD-10-CM | POA: Diagnosis not present

## 2022-02-16 ENCOUNTER — Other Ambulatory Visit: Payer: Self-pay | Admitting: Internal Medicine

## 2022-02-16 ENCOUNTER — Ambulatory Visit
Admission: RE | Admit: 2022-02-16 | Discharge: 2022-02-16 | Disposition: A | Discharge status: Home / Self Care | Payer: Medicare HMO | Source: Ambulatory Visit | Attending: Internal Medicine | Admitting: Internal Medicine

## 2022-02-16 DIAGNOSIS — R0781 Pleurodynia: Secondary | ICD-10-CM | POA: Diagnosis not present

## 2022-04-12 DIAGNOSIS — J310 Chronic rhinitis: Secondary | ICD-10-CM | POA: Diagnosis not present

## 2022-04-12 DIAGNOSIS — H903 Sensorineural hearing loss, bilateral: Secondary | ICD-10-CM | POA: Diagnosis not present

## 2022-04-12 DIAGNOSIS — J343 Hypertrophy of nasal turbinates: Secondary | ICD-10-CM | POA: Diagnosis not present

## 2022-04-12 DIAGNOSIS — H608X3 Other otitis externa, bilateral: Secondary | ICD-10-CM | POA: Diagnosis not present

## 2022-05-08 DIAGNOSIS — L309 Dermatitis, unspecified: Secondary | ICD-10-CM | POA: Diagnosis not present

## 2022-05-08 DIAGNOSIS — E039 Hypothyroidism, unspecified: Secondary | ICD-10-CM | POA: Diagnosis not present

## 2022-05-08 DIAGNOSIS — R32 Unspecified urinary incontinence: Secondary | ICD-10-CM | POA: Diagnosis not present

## 2022-05-08 DIAGNOSIS — M545 Low back pain, unspecified: Secondary | ICD-10-CM | POA: Diagnosis not present

## 2022-05-08 DIAGNOSIS — G8929 Other chronic pain: Secondary | ICD-10-CM | POA: Diagnosis not present

## 2022-05-25 DIAGNOSIS — M549 Dorsalgia, unspecified: Secondary | ICD-10-CM | POA: Diagnosis not present

## 2022-05-25 DIAGNOSIS — M5136 Other intervertebral disc degeneration, lumbar region: Secondary | ICD-10-CM | POA: Diagnosis not present

## 2022-06-05 ENCOUNTER — Other Ambulatory Visit: Payer: Self-pay | Admitting: Internal Medicine

## 2022-06-05 ENCOUNTER — Ambulatory Visit
Admission: RE | Admit: 2022-06-05 | Discharge: 2022-06-05 | Disposition: A | Discharge status: Home / Self Care | Payer: Medicare HMO | Source: Ambulatory Visit | Attending: Internal Medicine | Admitting: Internal Medicine

## 2022-06-05 DIAGNOSIS — F0390 Unspecified dementia without behavioral disturbance: Secondary | ICD-10-CM | POA: Diagnosis not present

## 2022-06-05 DIAGNOSIS — G8929 Other chronic pain: Secondary | ICD-10-CM

## 2022-06-05 DIAGNOSIS — M549 Dorsalgia, unspecified: Secondary | ICD-10-CM | POA: Diagnosis not present

## 2022-06-05 DIAGNOSIS — M545 Low back pain, unspecified: Secondary | ICD-10-CM | POA: Diagnosis not present

## 2022-06-20 DIAGNOSIS — E46 Unspecified protein-calorie malnutrition: Secondary | ICD-10-CM | POA: Diagnosis not present

## 2022-06-20 DIAGNOSIS — I700 Atherosclerosis of aorta: Secondary | ICD-10-CM | POA: Diagnosis not present

## 2022-06-20 DIAGNOSIS — R21 Rash and other nonspecific skin eruption: Secondary | ICD-10-CM | POA: Diagnosis not present

## 2022-06-20 DIAGNOSIS — R32 Unspecified urinary incontinence: Secondary | ICD-10-CM | POA: Diagnosis not present

## 2022-06-29 DIAGNOSIS — J449 Chronic obstructive pulmonary disease, unspecified: Secondary | ICD-10-CM | POA: Diagnosis not present

## 2022-06-29 DIAGNOSIS — M5136 Other intervertebral disc degeneration, lumbar region: Secondary | ICD-10-CM | POA: Diagnosis not present

## 2022-07-05 DIAGNOSIS — M5136 Other intervertebral disc degeneration, lumbar region: Secondary | ICD-10-CM | POA: Diagnosis not present

## 2022-07-11 DIAGNOSIS — M5136 Other intervertebral disc degeneration, lumbar region: Secondary | ICD-10-CM | POA: Diagnosis not present

## 2022-07-13 DIAGNOSIS — M5136 Other intervertebral disc degeneration, lumbar region: Secondary | ICD-10-CM | POA: Diagnosis not present

## 2022-07-17 DIAGNOSIS — E46 Unspecified protein-calorie malnutrition: Secondary | ICD-10-CM | POA: Diagnosis not present

## 2022-07-17 DIAGNOSIS — Z0000 Encounter for general adult medical examination without abnormal findings: Secondary | ICD-10-CM | POA: Diagnosis not present

## 2022-07-17 DIAGNOSIS — J449 Chronic obstructive pulmonary disease, unspecified: Secondary | ICD-10-CM | POA: Diagnosis not present

## 2022-07-17 DIAGNOSIS — D72819 Decreased white blood cell count, unspecified: Secondary | ICD-10-CM | POA: Diagnosis not present

## 2022-07-17 DIAGNOSIS — E039 Hypothyroidism, unspecified: Secondary | ICD-10-CM | POA: Diagnosis not present

## 2022-07-17 DIAGNOSIS — M5136 Other intervertebral disc degeneration, lumbar region: Secondary | ICD-10-CM | POA: Diagnosis not present

## 2022-07-17 DIAGNOSIS — N401 Enlarged prostate with lower urinary tract symptoms: Secondary | ICD-10-CM | POA: Diagnosis not present

## 2022-07-17 DIAGNOSIS — I700 Atherosclerosis of aorta: Secondary | ICD-10-CM | POA: Diagnosis not present

## 2022-07-17 DIAGNOSIS — F0390 Unspecified dementia without behavioral disturbance: Secondary | ICD-10-CM | POA: Diagnosis not present

## 2022-08-10 DIAGNOSIS — M791 Myalgia, unspecified site: Secondary | ICD-10-CM | POA: Diagnosis not present

## 2022-08-10 DIAGNOSIS — M5451 Vertebrogenic low back pain: Secondary | ICD-10-CM | POA: Diagnosis not present

## 2022-08-18 DIAGNOSIS — M5451 Vertebrogenic low back pain: Secondary | ICD-10-CM | POA: Diagnosis not present

## 2022-08-25 DIAGNOSIS — M5451 Vertebrogenic low back pain: Secondary | ICD-10-CM | POA: Diagnosis not present

## 2022-09-27 DIAGNOSIS — M5459 Other low back pain: Secondary | ICD-10-CM | POA: Diagnosis not present

## 2022-09-27 DIAGNOSIS — M5451 Vertebrogenic low back pain: Secondary | ICD-10-CM | POA: Diagnosis not present

## 2022-10-11 DIAGNOSIS — M5451 Vertebrogenic low back pain: Secondary | ICD-10-CM | POA: Diagnosis not present

## 2022-10-25 DIAGNOSIS — M5136 Other intervertebral disc degeneration, lumbar region: Secondary | ICD-10-CM | POA: Diagnosis not present

## 2022-10-25 DIAGNOSIS — M5451 Vertebrogenic low back pain: Secondary | ICD-10-CM | POA: Diagnosis not present

## 2022-10-27 DIAGNOSIS — M5416 Radiculopathy, lumbar region: Secondary | ICD-10-CM | POA: Insufficient documentation

## 2022-10-31 DIAGNOSIS — R3915 Urgency of urination: Secondary | ICD-10-CM | POA: Diagnosis not present

## 2022-11-02 DIAGNOSIS — M5416 Radiculopathy, lumbar region: Secondary | ICD-10-CM | POA: Diagnosis not present

## 2022-11-05 ENCOUNTER — Emergency Department (HOSPITAL_COMMUNITY): Payer: Medicare Other

## 2022-11-05 ENCOUNTER — Other Ambulatory Visit: Payer: Self-pay

## 2022-11-05 ENCOUNTER — Encounter (HOSPITAL_COMMUNITY): Payer: Self-pay

## 2022-11-05 ENCOUNTER — Emergency Department (HOSPITAL_COMMUNITY)
Admission: EM | Admit: 2022-11-05 | Discharge: 2022-11-05 | Disposition: A | Discharge status: Home / Self Care | Payer: Medicare Other | Attending: Emergency Medicine | Admitting: Emergency Medicine

## 2022-11-05 DIAGNOSIS — N433 Hydrocele, unspecified: Secondary | ICD-10-CM | POA: Diagnosis not present

## 2022-11-05 DIAGNOSIS — R300 Dysuria: Secondary | ICD-10-CM | POA: Insufficient documentation

## 2022-11-05 DIAGNOSIS — R2243 Localized swelling, mass and lump, lower limb, bilateral: Secondary | ICD-10-CM | POA: Diagnosis present

## 2022-11-05 LAB — URINALYSIS, ROUTINE W REFLEX MICROSCOPIC
Bilirubin Urine: NEGATIVE
Glucose, UA: NEGATIVE mg/dL
Hgb urine dipstick: NEGATIVE
Ketones, ur: NEGATIVE mg/dL
Leukocytes,Ua: NEGATIVE
Nitrite: NEGATIVE
Protein, ur: NEGATIVE mg/dL
Specific Gravity, Urine: 1.01 (ref 1.005–1.030)
pH: 5 (ref 5.0–8.0)

## 2022-11-05 NOTE — ED Triage Notes (Signed)
Pt reports swelling to his right testicle that he noticed " a few days ago" associated with urinary frequency, as well as chronic back pain that has been going on for a "few months."

## 2022-11-05 NOTE — ED Provider Notes (Signed)
[place] EMERGENCY DEPARTMENT AT [facility] Provider Note   CSN: [id number] Arrival date & time: 11/05/22  0214     History  Chief Complaint  Patient presents with   Groin Swelling    Anthony Ball is a 88 y.o. male.  The history is provided by the patient.  Testicle Pain This is a new problem. The current episode started more than 2 days ago. The problem occurs constantly. The problem has not changed since onset.Pertinent negatives include no chest pain, no abdominal pain, no headaches and no shortness of breath. Nothing aggravates the symptoms. Nothing relieves the symptoms. He has tried nothing for the symptoms. The treatment provided no relief.  4 days of right testicle swelling.  No fevers.  No urinary symptoms.       Home Medications Prior to Admission medications   Medication Sig Start Date End Date Taking? Authorizing Provider  albuterol (PROVENTIL HFA;VENTOLIN HFA) 108 (90 BASE) MCG/ACT inhaler Inhale 2 puffs into the lungs every 4 (four) hours as needed for wheezing or shortness of breath. 04/13/12   Eber Hong, MD  HYDROcodone-acetaminophen (NORCO/VICODIN) 5-325 MG per tablet Take 1-2 tablets by mouth every 6 hours as needed for pain. 07/21/12   Pisciotta, Joni Reining, PA-C  ketorolac (ACULAR) 0.5 % ophthalmic solution Place 1 drop into the right eye 4 (four) times daily. 07/02/20   Cuthriell, Delorise Royals, PA-C  omeprazole (PRILOSEC) 20 MG capsule Take 20 mg by mouth 2 (two) times daily.     [provider]  triamcinolone cream (KENALOG) 0.1 % Apply 1 application topically every other day. Apply to great areas of body    [provider]  trimethoprim-polymyxin b (POLYTRIM) ophthalmic solution Place 2 drops into the right eye every 6 (six) hours. 07/02/20   Cuthriell, Delorise Royals, PA-C      Allergies    Patient has no known allergies.    Review of Systems   Review of Systems  Constitutional:  Negative for fever.  Respiratory:  Negative for  shortness of breath.   Cardiovascular:  Negative for chest pain.  Gastrointestinal:  Negative for abdominal pain.  Genitourinary:  Positive for scrotal swelling. Negative for testicular pain.  Neurological:  Negative for headaches.  All other systems reviewed and are negative.   Physical Exam Updated Vital Signs BP 132/82 (BP Location: Right Arm)   Pulse 64   Temp 98 F (36.7 C) (Oral)   Resp 17   Ht 5\' 5"  (1.651 m)   Wt 62.6 kg   SpO2 99%   BMI 22.96 kg/m  Physical Exam Vitals and nursing note reviewed. Exam conducted with a chaperone present.  Constitutional:      General: He is not in acute distress.    Appearance: He is well-developed. He is not diaphoretic.  HENT:     Head: Normocephalic and atraumatic.     Nose: Nose normal.  Eyes:     Conjunctiva/sclera: Conjunctivae normal.     Pupils: Pupils are equal, round, and reactive to light.  Cardiovascular:     Rate and Rhythm: Normal rate and regular rhythm.  Pulmonary:     Effort: Pulmonary effort is normal.     Breath sounds: Normal breath sounds. No wheezing or rales.  Abdominal:     General: Bowel sounds are normal.     Palpations: Abdomen is soft.     Tenderness: There is no abdominal tenderness. There is no guarding or rebound.  Genitourinary:    Comments: Right testicle  swollen, no erythema no warmth, normal alignment  Musculoskeletal:        General: Normal range of motion.     Cervical back: Normal range of motion and neck supple.  Skin:    General: Skin is warm and dry.     Capillary Refill: Capillary refill takes less than 2 seconds.  Neurological:     General: No focal deficit present.     Mental Status: He is alert and oriented to person, place, and time.     Deep Tendon Reflexes: Reflexes normal.  Psychiatric:        Mood and Affect: Mood normal.     ED Results / Procedures / Treatments   Labs (all labs ordered are listed, but only abnormal results are displayed) Results for orders placed or  performed during the hospital encounter of 11/05/22  Urinalysis, Routine w reflex microscopic -Urine, Clean Catch  Result Value Ref Range   Color, Urine YELLOW YELLOW   APPearance CLEAR CLEAR   Specific Gravity, Urine 1.010 1.005 - 1.030   pH 5.0 5.0 - 8.0   Glucose, UA NEGATIVE NEGATIVE mg/dL   Hgb urine dipstick NEGATIVE NEGATIVE   Bilirubin Urine NEGATIVE NEGATIVE   Ketones, ur NEGATIVE NEGATIVE mg/dL   Protein, ur NEGATIVE NEGATIVE mg/dL   Nitrite NEGATIVE NEGATIVE   Leukocytes,Ua NEGATIVE NEGATIVE   No results found.   Radiology No results found.  Procedures Procedures    Medications Ordered in ED Medications - No data to display  ED Course/ Medical Decision Making/ A&P                                 Medical Decision Making Patient with right testicle swelling   Amount and/or Complexity of Data Reviewed External Data Reviewed: notes.    Details: Previous notes reviewed  Labs: ordered.    Details: Urine is negative for UTI Radiology: ordered and independent interpretation performed.    Details: Hydroceles by me on [place]  Risk Risk Details: Hydroceles.  Follow up with urology for ongoing care.       Final Clinical Impression(s) / ED Diagnoses Final diagnoses:  None   Return for intractable cough, coughing up blood, fevers > 100.4 unrelieved by medication, shortness of breath, intractable vomiting, chest pain, shortness of breath, weakness, numbness, changes in speech, facial asymmetry, abdominal pain, passing out, Inability to tolerate liquids or food, cough, altered mental status or any concerns. No signs of systemic illness or infection. The patient is nontoxic-appearing on exam and vital signs are within normal limits.  I have reviewed the triage vital signs and the nursing notes. Pertinent labs & imaging results that were available during my care of the patient were reviewed by me and considered in my medical decision making (see chart for details). After  history, exam, and medical workup I feel the patient has been appropriately medically screened and is safe for discharge home. Pertinent diagnoses were discussed with the patient. Patient was given return precautions.  Rx / DC Orders ED Discharge Orders     None         Diontay Rosencrans, MD 11/05/22 [phone]

## 2022-11-05 NOTE — ED Notes (Signed)
Pt provided with AVS.  Education complete; all questions answered.  Pt leaving ED in stable condition at this time, ambulatory with all belongings. 

## 2022-11-06 LAB — URINE CULTURE
Culture: <10000 — AB
Special Requests: NORMAL

## 2022-11-07 DIAGNOSIS — R350 Frequency of micturition: Secondary | ICD-10-CM | POA: Diagnosis not present

## 2022-11-07 DIAGNOSIS — M5416 Radiculopathy, lumbar region: Secondary | ICD-10-CM | POA: Diagnosis not present

## 2022-11-14 DIAGNOSIS — R350 Frequency of micturition: Secondary | ICD-10-CM | POA: Diagnosis not present

## 2022-12-11 DIAGNOSIS — M47816 Spondylosis without myelopathy or radiculopathy, lumbar region: Secondary | ICD-10-CM | POA: Insufficient documentation

## 2022-12-26 DIAGNOSIS — M47816 Spondylosis without myelopathy or radiculopathy, lumbar region: Secondary | ICD-10-CM | POA: Diagnosis not present

## 2023-01-30 DIAGNOSIS — M7918 Myalgia, other site: Secondary | ICD-10-CM | POA: Diagnosis not present

## 2023-01-30 DIAGNOSIS — M791 Myalgia, unspecified site: Secondary | ICD-10-CM | POA: Insufficient documentation

## 2023-02-26 DIAGNOSIS — M5416 Radiculopathy, lumbar region: Secondary | ICD-10-CM | POA: Diagnosis not present

## 2023-03-07 DIAGNOSIS — W108XXA Fall (on) (from) other stairs and steps, initial encounter: Secondary | ICD-10-CM | POA: Diagnosis not present

## 2023-03-07 DIAGNOSIS — M545 Low back pain, unspecified: Secondary | ICD-10-CM | POA: Diagnosis not present

## 2023-03-16 ENCOUNTER — Encounter: Payer: Self-pay | Admitting: *Deleted

## 2023-03-16 ENCOUNTER — Encounter (HOSPITAL_COMMUNITY): Payer: Self-pay | Admitting: Emergency Medicine

## 2023-03-16 ENCOUNTER — Other Ambulatory Visit: Payer: Self-pay

## 2023-03-16 ENCOUNTER — Emergency Department (HOSPITAL_COMMUNITY)
Admission: EM | Admit: 2023-03-16 | Discharge: 2023-03-16 | Disposition: A | Discharge status: Home / Self Care | Payer: Medicare Other | Attending: Emergency Medicine | Admitting: Emergency Medicine

## 2023-03-16 ENCOUNTER — Emergency Department (HOSPITAL_COMMUNITY): Payer: Medicare Other

## 2023-03-16 DIAGNOSIS — J449 Chronic obstructive pulmonary disease, unspecified: Secondary | ICD-10-CM | POA: Diagnosis not present

## 2023-03-16 DIAGNOSIS — F0390 Unspecified dementia without behavioral disturbance: Secondary | ICD-10-CM | POA: Diagnosis not present

## 2023-03-16 DIAGNOSIS — I499 Cardiac arrhythmia, unspecified: Secondary | ICD-10-CM | POA: Diagnosis not present

## 2023-03-16 DIAGNOSIS — R0789 Other chest pain: Secondary | ICD-10-CM | POA: Diagnosis not present

## 2023-03-16 DIAGNOSIS — R519 Headache, unspecified: Secondary | ICD-10-CM | POA: Diagnosis not present

## 2023-03-16 DIAGNOSIS — Z743 Need for continuous supervision: Secondary | ICD-10-CM | POA: Diagnosis not present

## 2023-03-16 DIAGNOSIS — R079 Chest pain, unspecified: Secondary | ICD-10-CM | POA: Diagnosis not present

## 2023-03-16 DIAGNOSIS — Z1152 Encounter for screening for COVID-19: Secondary | ICD-10-CM | POA: Diagnosis not present

## 2023-03-16 LAB — CBC
HCT: 42.2 % (ref 39.0–52.0)
Hemoglobin: 13.3 g/dL (ref 13.0–17.0)
MCH: 29 pg (ref 26.0–34.0)
MCHC: 31.5 g/dL (ref 30.0–36.0)
MCV: 91.9 fL (ref 80.0–100.0)
Platelets: 266 10*3/uL (ref 150–400)
RBC: 4.59 MIL/uL (ref 4.22–5.81)
RDW: 13.9 % (ref 11.5–15.5)
WBC: 6.1 10*3/uL (ref 4.0–10.5)
nRBC: 0 % (ref 0.0–0.2)

## 2023-03-16 LAB — BASIC METABOLIC PANEL
Anion gap: 10 (ref 5–15)
BUN: 25 mg/dL — ABNORMAL HIGH (ref 8–23)
CO2: 25 mmol/L (ref 22–32)
Calcium: 9.1 mg/dL (ref 8.9–10.3)
Chloride: 105 mmol/L (ref 98–111)
Creatinine, Ser: 1.14 mg/dL (ref 0.61–1.24)
GFR, Estimated: >60 mL/min (ref >60)
Glucose, Bld: 82 mg/dL (ref 70–99)
Potassium: 4.3 mmol/L (ref 3.5–5.1)
Sodium: 140 mmol/L (ref 135–145)

## 2023-03-16 LAB — RESP PANEL BY RT-PCR (RSV, FLU A&B, COVID)  RVPGX2
Influenza A by PCR: NEGATIVE
Influenza B by PCR: NEGATIVE
Resp Syncytial Virus by PCR: NEGATIVE
SARS Coronavirus 2 by RT PCR: NEGATIVE

## 2023-03-16 LAB — TROPONIN I (HIGH SENSITIVITY)
Troponin I (High Sensitivity): 5 ng/L (ref <18)
Troponin I (High Sensitivity): 5 ng/L (ref <18)

## 2023-03-16 NOTE — ED Triage Notes (Signed)
Pt BIB [facility] from [facility]. Patient walk in c/o chest pain been going on since 2 pm, which is describe as a pressure 2/10 pain. Patient told EMS he sees Dr Alger Simons his back doctor, which he told EMS he knows his medication. EMS gave him Aspirin 324 mg. EMS reported patient first EKG showed ST elevation. And is subsequent EKG was normal. Patient is A & O x3 with little confusion. Vital signs: BP 127 71, HR 58, RR 16 to 20, Spo2 98% RA

## 2023-03-16 NOTE — Discharge Instructions (Signed)
You were seen for your chest pain in the emergency department.   At home, please take Tylenol for your pain.    Follow-up with your primary doctor in 2-3 days regarding your visit.  Cardiology will be calling you regarding an appointment within the next 72 hours.  You may contact them if you do not hear from them in that time using the information in this packet.  Return immediately to the emergency department if you experience any of the following: Worsening pain, difficulty breathing, unexplained vomiting or sweating, or any other concerning symptoms.    Thank you for visiting our Emergency Department. It was a pleasure taking care of you today.

## 2023-03-16 NOTE — Progress Notes (Signed)
[facility] at 3:22[facility]. Does not appear that he has ever been seen by a [facility] provider before. Met patient in the lobby assisted by Jeb Levering, RN, as patient reported "funny feelings" in his chest and head to front desk staff. Patient seemed somewhat confused and was unable to describe his symptoms in detail. Oriented x3 (initially "foggy" on the year). He repeatedly denied "sharp pains." He was not sure why he came to our office, but said someone in the downstairs lobby told him to come here. Brought patient back into an exam room and discussed with Dr. Royann Shivers (DOD), who recommended assessment in the ED. Relayed recommendations to patient, who was in agreement with plan. Called EMS.  Vital signs as of 15:53: BP 114/62 left arm SpO2 95% on RA HR 68bpm  [name], Johnny Bridge, is listed as his EC but reportedly has dementia. Pt's brother-in-law reports pt is "not usually confused but is sometimes confusing." He recommended calling pt's other sister, Bonita Quin. Pt gave verbal permission to speak with Bonita Quin and provided her number [phone]). Bonita Quin reports that pt told her he was coming to the doctor this afternoon and that she assumed he had an appointment with his PCP. She states that she worries about him as "he gets a little confused sometimes." Explained to Donna that pt had presented at our office with concerning symptoms and that he will be transported via EMS to [facility] ED. Bonita Quin verbalized understanding.  EMS then arrived to transport patient to ED.  Charleen Kirks, RN  [facility] Clinical Supervisor

## 2023-03-16 NOTE — ED Provider Notes (Signed)
[place] EMERGENCY DEPARTMENT AT [facility] Provider Note   CSN: [id number] Arrival date & time: 03/16/23  1650     History {Add pertinent medical, surgical, social history, OB history to HPI:1} Chief Complaint  Patient presents with   Chest Pain   Altered Mental Status    Anthony Ball is a 88 y.o. male.  88 year old male with a history of COPD and reported dementia who presents emergency department with chest pains.  Patient reports that around 1:30 PM he started feeling a funny feeling in his left chest.  Says that it was only 2/10 in severity.  Has difficulty characterizing.  No diaphoresis or vomiting.  No shortness of breath.  Has had a cough recently and some congestion.  Also says that he had a funny feeling in his head when it occurred.  Was a [facility] and was referred into the emergency department because his EKG was abnormal.  Was given aspirin by EMS.       Home Medications Prior to Admission medications   Medication Sig Start Date End Date Taking? Authorizing Provider  albuterol (PROVENTIL HFA;VENTOLIN HFA) 108 (90 BASE) MCG/ACT inhaler Inhale 2 puffs into the lungs every 4 (four) hours as needed for wheezing or shortness of breath. 04/13/12   Eber Hong, MD  HYDROcodone-acetaminophen (NORCO/VICODIN) 5-325 MG per tablet Take 1-2 tablets by mouth every 6 hours as needed for pain. 07/21/12   Pisciotta, Joni Reining, PA-C  ketorolac (ACULAR) 0.5 % ophthalmic solution Place 1 drop into the right eye 4 (four) times daily. 07/02/20   Cuthriell, Delorise Royals, PA-C  omeprazole (PRILOSEC) 20 MG capsule Take 20 mg by mouth 2 (two) times daily.     [provider]  triamcinolone cream (KENALOG) 0.1 % Apply 1 application topically every other day. Apply to great areas of body    [provider]  trimethoprim-polymyxin b (POLYTRIM) ophthalmic solution Place 2 drops into the right eye every 6 (six) hours. 07/02/20   Cuthriell, Delorise Royals, PA-C       Allergies    Patient has no known allergies.    Review of Systems   Review of Systems  Physical Exam Updated Vital Signs BP 119/62 (BP Location: Right Arm)   Pulse (!) 58   Temp 98.2 F (36.8 C) (Oral)   Resp 18   SpO2 100%  Physical Exam Vitals and nursing note reviewed.  Constitutional:      General: He is not in acute distress.    Appearance: He is well-developed.  HENT:     Head: Normocephalic and atraumatic.     Right Ear: External ear normal.     Left Ear: External ear normal.     Nose: Nose normal.  Eyes:     Extraocular Movements: Extraocular movements intact.     Conjunctiva/sclera: Conjunctivae normal.     Pupils: Pupils are equal, round, and reactive to light.  Cardiovascular:     Rate and Rhythm: Normal rate and regular rhythm.     Heart sounds: Normal heart sounds.     Comments: Radial pulses 2+ bilaterally Pulmonary:     Effort: Pulmonary effort is normal. No respiratory distress.     Breath sounds: Normal breath sounds.  Musculoskeletal:     Cervical back: Normal range of motion and neck supple.     Right lower leg: No edema.     Left lower leg: No edema.  Skin:    General: Skin is warm and dry.  Neurological:     Mental Status: He is alert. Mental status is at baseline.     Cranial Nerves: No cranial nerve deficit.     Sensory: No sensory deficit.     Motor: No weakness.  Psychiatric:        Mood and Affect: Mood normal.        Behavior: Behavior normal.     ED Results / Procedures / Treatments   Labs (all labs ordered are listed, but only abnormal results are displayed) Labs Reviewed  BASIC METABOLIC PANEL  CBC  TROPONIN I (HIGH SENSITIVITY)    EKG EKG Interpretation Date/Time:  Friday March 16 2023 17:03:41 EST Ventricular Rate:  54 PR Interval:  173 QRS Duration:  91 QT Interval:  442 QTC Calculation: 419 R Axis:   24  Text Interpretation: Sinus rhythm Confirmed by Vonita Moss [phone] on 03/16/2023 5:17:20  PM  Radiology No results found.  Procedures Procedures  {Document cardiac monitor, telemetry assessment procedure when appropriate:1}  Medications Ordered in ED Medications - No data to display  ED Course/ Medical Decision Making/ A&P   {   Click here for ABCD2, HEART and other calculatorsREFRESH Note before signing :1}                              Medical Decision Making Amount and/or Complexity of Data Reviewed Labs: ordered. Radiology: ordered.   ***  {Document critical care time when appropriate:1} {Document review of labs and clinical decision tools ie heart score, Chads2Vasc2 etc:1}  {Document your independent review of radiology images, and any outside records:1} {Document your discussion with family members, caretakers, and with consultants:1} {Document social determinants of health affecting pt's care:1} {Document your decision making why or why not admission, treatments were needed:1} Final Clinical Impression(s) / ED Diagnoses Final diagnoses:  None    Rx / DC Orders ED Discharge Orders     None

## 2023-03-16 NOTE — ED Notes (Signed)
Family on the way to get patient. Will be waiting in room due to slight confusion.

## 2023-03-16 NOTE — ED Notes (Signed)
Patient's Brother-in-law Lenna Gilford is coming to pick up patient in front lobby. Patient states he is okay waiting in the waiting area for him.

## 2023-03-23 ENCOUNTER — Encounter: Payer: Self-pay | Admitting: Cardiology

## 2023-03-23 ENCOUNTER — Ambulatory Visit: Payer: Medicare Other | Attending: Cardiology | Admitting: Cardiology

## 2023-03-23 VITALS — BP 120/68 | HR 51 | Resp 16 | Ht 66.0 in | Wt 132.6 lb

## 2023-03-23 DIAGNOSIS — R0602 Shortness of breath: Secondary | ICD-10-CM | POA: Diagnosis not present

## 2023-03-23 DIAGNOSIS — R072 Precordial pain: Secondary | ICD-10-CM | POA: Diagnosis not present

## 2023-03-23 NOTE — Progress Notes (Signed)
 Cardiology Office Note:    Date:  03/23/2023  NAME:  Anthony Ball    MRN: [id number] DOB:  April 20, 1934   PCP:  Ransom Other, MD  Former Cardiology Providers: NA Primary Cardiologist:  Madonna Large, DO, [facility] (established care 03/23/2023) Electrophysiologist:  None   Referring MD: Ransom Other, MD  Reason of Consult: Chest pain  Chief Complaint  Patient presents with   Chest Pain   New Patient (Initial Visit)    History of Present Illness:    JAICION LAURIE is a 89 y.o. Caucasian male whose past medical history and cardiovascular risk factors includes: COPD, dementia. He is being seen today for the evaluation of chest pain at the request of Husain, Karrar, MD.  Patient is referred to the practice for evaluation of chest pain.  Based on electronic medical records he had an ER visit on 03/16/2023 for chest pain.  It appears that he was having funny feeling in his left chest approximately 1:30 PM was considered a code health heart care and was referred to the ER.  He did not have any pain with exertion and no pain with resting and relaxing.  He denied any associated shortness of breath, URI symptoms, near-syncope or syncope.  In the emergency room department high sensitive troponins were negative x 2 and EKG did not meet STEMI criteria.  He is now referred to cardiology for further evaluation and management.  Chest pain: Ongoing for some time. Last episode a day ago. Not brought on by effort related activities, does not resolve with rest. Duration around 15 minutes or so. Not reproducible with palpation, nonpleuritic, nonpositional Located substernal no treadmill Describes a sensation as  heart not feeling right. And wants to know if he has blockages in his heart.  Not the best historian but able to provide meaningful history of present illness.  When asked if he would like me to speak to any of his family members he said no.  Current Medications: Current Meds   Medication Sig   levothyroxine  (SYNTHROID ) 25 MCG tablet Take 25 mcg by mouth daily before breakfast.   meloxicam (MOBIC) 15 MG tablet Take 15 mg by mouth daily.   Omega-3 Fatty Acids (FISH OIL) 1000 MG CAPS Take 1 capsule by mouth daily.   Oyster Shell Calcium 500 MG TABS Take 1 tablet by mouth daily.   triamcinolone  cream (KENALOG ) 0.1 % Apply 1 application topically every other day. Apply to great areas of body     Allergies:    Patient has no known allergies.   Past Medical History: Past Medical History:  Diagnosis Date   COPD (chronic obstructive pulmonary disease) (HCC)    Emphysema    GERD (gastroesophageal reflux disease)    Seasonal allergies     Past Surgical History: Past Surgical History:  Procedure Laterality Date   CARDIAC CATHETERIZATION     PROSTATE SURGERY      Social History: Social History   Tobacco Use   Smoking status: Never  Substance Use Topics   Alcohol use: No   Drug use: No    Family History: History reviewed. No pertinent family history.  ROS:   Review of Systems  HENT:         Hard of hearing  Cardiovascular:  Positive for chest pain. Negative for claudication, irregular heartbeat, leg swelling, near-syncope, orthopnea, palpitations, paroxysmal nocturnal dyspnea and syncope.  Respiratory:  Positive for shortness of breath.   Hematologic/Lymphatic: Negative for bleeding problem.  EKGs/Labs/Other Studies Reviewed:   EKG: EKG Interpretation Date/Time:  Friday March 23 2023 09:22:57 EST Ventricular Rate:  51 PR Interval:  176 QRS Duration:  72 QT Interval:  438 QTC Calculation: 403 R Axis:   3  Text Interpretation: Sinus bradycardia When compared with ECG of 16-Mar-2023 17:03, No significant change since last tracing Confirmed by Michele Richardson [phone]) on 03/23/2023 9:35:01 AM  Echocardiogram: NA  Stress Testing:  NA  Labs:    Latest Ref Rng & Units 03/16/2023    5:57 PM 04/13/2012   11:57 AM 04/30/2009    6:16 PM  CBC   WBC 4.0 - 10.5 K/uL 6.1  3.7    Hemoglobin 13.0 - 17.0 g/dL 86.6  84.9  84.6   Hematocrit 39.0 - 52.0 % 42.2  44.7  45.0   Platelets 150 - 400 K/uL 266  251         Latest Ref Rng & Units 03/16/2023    5:57 PM 04/13/2012   11:57 AM 04/30/2009    6:16 PM  BMP  Glucose 70 - 99 mg/dL 82  82  78   BUN 8 - 23 mg/dL 25  14  16    Creatinine 0.61 - 1.24 mg/dL 8.85  9.23  0.7   Sodium 135 - 145 mmol/L 140  137  137   Potassium 3.5 - 5.1 mmol/L 4.3  4.5  5.5   Chloride 98 - 111 mmol/L 105  104  106   CO2 22 - 32 mmol/L 25  24    Calcium 8.9 - 10.3 mg/dL 9.1  9.1        Latest Ref Rng & Units 03/16/2023    5:57 PM 04/13/2012   11:57 AM 04/30/2009    6:16 PM  CMP  Glucose 70 - 99 mg/dL 82  82  78   BUN 8 - 23 mg/dL 25  14  16    Creatinine 0.61 - 1.24 mg/dL 8.85  9.23  0.7   Sodium 135 - 145 mmol/L 140  137  137   Potassium 3.5 - 5.1 mmol/L 4.3  4.5  5.5   Chloride 98 - 111 mmol/L 105  104  106   CO2 22 - 32 mmol/L 25  24    Calcium 8.9 - 10.3 mg/dL 9.1  9.1      No results found for: CHOL, HDL, LDLCALC, LDLDIRECT, TRIG, CHOLHDL No results for input(s): LIPOA in the last 8760 hours. No components found for: NTPROBNP No results for input(s): PROBNP in the last 8760 hours. No results for input(s): TSH in the last 8760 hours.  Physical Exam:    Today's Vitals   03/23/23 0918  BP: 120/68  Pulse: (!) 51  Resp: 16  SpO2: 95%  Weight: 132 lb 9.6 oz (60.1 kg)  Height: 5' 6 (1.676 m)   Body mass index is 21.4 kg/m. Wt Readings from Last 3 Encounters:  03/23/23 132 lb 9.6 oz (60.1 kg)  03/16/23 140 lb (63.5 kg)  11/05/22 138 lb (62.6 kg)    Physical Exam  Constitutional: No distress. He appears chronically ill.  hemodynamically stable, older than stated age.   Neck: No JVD present.  Cardiovascular: Regular rhythm, S1 normal and S2 normal. Bradycardia present. Exam reveals decreased pulses. Exam reveals no gallop, no S3 and no S4.  No murmur  heard. Pulmonary/Chest: Effort normal and breath sounds normal. No stridor. He has no wheezes. He has no rales.  Abdominal: Soft. Bowel sounds are normal. He exhibits  no distension. There is no abdominal tenderness.  Musculoskeletal:        General: No edema.     Cervical back: Neck supple.  Neurological: He is alert and oriented to person, place, and time.  Skin: Skin is warm.   Impression & Recommendation(s):  Impression:   ICD-10-CM   1. Precordial pain  R07.2 EKG 12-Lead    MYOCARDIAL PERFUSION IMAGING    ECHOCARDIOGRAM COMPLETE    2. Shortness of breath  R06.02        Recommendation(s):  Precordial pain Shortness of breath His precordial discomfort is predominately noncardiac Today is nonischemic Went to the ER in the recent past for similar symptoms-records reviewed. Echo will be ordered to evaluate for structural heart disease and left ventricular systolic function. Pharmacological stress to evaluate for reversible ischemia-EKG is interpretable patient is unable to exercise on a treadmill as he is a fall risk. Recommend that he follows up with PCP to be screened for diabetes as well as hyperlipidemia. Further recommendations to follow.  Orders Placed:  Orders Placed This Encounter  Procedures   MYOCARDIAL PERFUSION IMAGING    Standing Status:   Future    Expiration Date:   03/22/2024    Patient weight in lbs:   132    Where should this be performed?:   [facility]    Type of stress:   Lexiscan    EKG 12-Lead   ECHOCARDIOGRAM COMPLETE    Standing Status:   Future    Expected Date:   03/30/2023    Expiration Date:   03/22/2024    Where should this test be performed:   [facility])    Does the patient weigh less than or greater than 250 lbs?:   Patient weighs less than 250 lbs    Perflutren DEFINITY (image enhancing agent) should be administered unless hypersensitivity or allergy exist:   Administer Perflutren    Reason for  exam-Echo:   Other-Full Diagnosis List    Full ICD-10/Reason for Exam:   Chest pain [744799]   As part of today's office visit reviewed ER documentation from 03/16/2023, labs from 03/16/2023, EKG ordered and independently reviewed, diagnostic tests ordered as discussed above, and coordination of care.  Final Medication List:   No orders of the defined types were placed in this encounter.   There are no discontinued medications.   Current Outpatient Medications:    levothyroxine  (SYNTHROID ) 25 MCG tablet, Take 25 mcg by mouth daily before breakfast., Disp: , Rfl:    meloxicam (MOBIC) 15 MG tablet, Take 15 mg by mouth daily., Disp: , Rfl:    Omega-3 Fatty Acids (FISH OIL) 1000 MG CAPS, Take 1 capsule by mouth daily., Disp: , Rfl:    Oyster Shell Calcium 500 MG TABS, Take 1 tablet by mouth daily., Disp: , Rfl:    triamcinolone  cream (KENALOG ) 0.1 %, Apply 1 application topically every other day. Apply to great areas of body, Disp: , Rfl:    albuterol  (PROVENTIL  HFA;VENTOLIN  HFA) 108 (90 BASE) MCG/ACT inhaler, Inhale 2 puffs into the lungs every 4 (four) hours as needed for wheezing or shortness of breath., Disp: 1 Inhaler, Rfl: 3   memantine  (NAMENDA ) 10 MG tablet, Take 10 mg by mouth daily., Disp: , Rfl:    omeprazole (PRILOSEC) 20 MG capsule, Take 20 mg by mouth 2 (two) times daily. , Disp: , Rfl:   Consent:   Informed Consent   Shared Decision Making/Informed Consent The  risks [chest pain, shortness of breath, cardiac arrhythmias, dizziness, blood pressure fluctuations, myocardial infarction, stroke/transient ischemic attack, nausea, vomiting, allergic reaction, radiation exposure, metallic taste sensation and life-threatening complications (estimated to be 1 in 10,000)], benefits (risk stratification, diagnosing coronary artery disease, treatment guidance) and alternatives of a nuclear stress test were discussed in detail with Mr. Iwanicki and he agrees to proceed.     Disposition:   52-month  follow-up with APP and 1 year follow-up with myself sooner if needed  Patient may be asked to follow-up sooner based on the results of the above-mentioned testing.  His questions and concerns were addressed to his satisfaction. He voices understanding of the recommendations provided during this encounter.    Signed, Madonna Large, DO, [facility]  [facility]  [address] [place], [place] [address] 03/23/2023 9:53 AM

## 2023-03-23 NOTE — Patient Instructions (Addendum)
 Medication Instructions:  Your physician recommends that you continue on your current medications as directed. Please refer to the Current Medication list given to you today.  *If you need a refill on your cardiac medications before your next appointment, please call your pharmacy*  Lab Work: None ordered today. If you have labs (blood work) drawn today and your tests are completely normal, you will receive your results only by: MyChart Message (if you have MyChart) OR A paper copy in the mail If you have any lab test that is abnormal or we need to change your treatment, we will call you to review the results.  Testing/Procedures: Your physician has requested that you have an echocardiogram. Echocardiography is a painless test that uses sound waves to create images of your heart. It provides your doctor with information about the size and shape of your heart and how well your heart's chambers and valves are working. This procedure takes approximately one hour. There are no restrictions for this procedure. Please do NOT wear cologne, perfume, aftershave, or lotions (deodorant is allowed). Please arrive 15 minutes prior to your appointment time.  Please note: We ask at that you not bring children with you during ultrasound (echo/ vascular) testing. Due to room size and safety concerns, children are not allowed in the ultrasound rooms during exams. Our front office staff cannot provide observation of children in our lobby area while testing is being conducted. An adult accompanying a patient to their appointment will only be allowed in the ultrasound room at the discretion of the ultrasound technician under special circumstances. We apologize for any inconvenience.  Your physician has requested that you have a Lexiscan  Nuclear Stress Test.  Follow-Up: At [facility], you and your health needs are our priority.  As part of our continuing mission to provide you with exceptional heart care, we  have created designated Provider Care Teams.  These Care Teams include your primary Cardiologist (physician) and Advanced Practice Providers (APPs -  Physician Assistants and Nurse Practitioners) who all work together to provide you with the care you need, when you need it.  We recommend signing up for the patient portal called MyChart.  Sign up information is provided on this After Visit Summary.  MyChart is used to connect with patients for Virtual Visits (Telemedicine).  Patients are able to view lab/test results, encounter notes, upcoming appointments, etc.  Non-urgent messages can be sent to your provider as well.   To learn more about what you can do with MyChart, go to [email or web address].    Your next appointment:   6 month(s)  The format for your next appointment:   In Person  Provider:   Orren Fabry, PA-C, Dayna Dunn, PA-C, Jackee Alberts, NP, Olivia Pavy, PA-C, Rosaline Bane, NP, Glendia Ferrier, PA-C, or Artist Pouch, PA-C     Then, Madonna Large, DO will plan to see you again in 1 year(s).{

## 2023-03-27 ENCOUNTER — Telehealth: Payer: Self-pay

## 2023-03-27 NOTE — Telephone Encounter (Signed)
 Attempted to call, no answer. S.Irl Bodie CCT

## 2023-03-28 ENCOUNTER — Telehealth (HOSPITAL_COMMUNITY): Payer: Self-pay | Admitting: Licensed Clinical Social Worker

## 2023-03-28 NOTE — Telephone Encounter (Signed)
 Pt sister called CSW back to discuss patient concerns.  Rock states concerns about patient home cleanliness and personal hygiene as far as not doing laundry often but she attributes this to patient personal habits.  Reports no concerns with patient ever getting lost due to memory concerns or having unsafe living conditions.  Reports she helps him remember appts and put the appt details on his refridgerator.  States he does have issues keeping up with what day it is which contributes to him missing appts.  She tries to remind him of appts but sometimes he messes with the phone line and it stops working temporarily or he forgets to put the phone back on the base she she can't call him when she needs.   At this time there is no further assistance available to help in this matter- sister is involved and helping with appt compliance but patient is not agreeable to much other help.  Concerns are not sufficient to argue neglect or incompetence which would warrant a APS case- informed pt sister that if this changed and concerns became more severe that this might be a possible option.  Please place another referral to [id number] if further concerns arise in subsequent appts.  Andriette HILARIO Leech, LCSW Clinical Social Worker [facility] Desk#: [phone] Cell#: [phone]

## 2023-03-28 NOTE — Telephone Encounter (Signed)
 H&V Care Navigation CSW Progress Note  Clinical Social Worker consulted by MD to help patient connect to case management resources to remember appts as he had come into cardiology office multiple days in a row leading up to recent appt.  CSW called pt to discuss.  Patient reports he keeps his appts on his fridge and he is aware of upcoming appt times and dates- has no concerns with making these appts- drives himself.  Reports living alone and completing ADLS independently.    Patient is agreeable to CSW calling his sister Rock to provide with appt information to assist with keeping up with future appts.  CSW attempted to call sister to get additional information on patient living situation and concerns as patient was not consistent throughout conversation and would often answer questions with totally unrelated information- unclear if some of this could be due to hearing impairment- patient continued to want to discuss his back concerns despite reiterating my role as a child psychotherapist and non medical personal.   Will continue to follow and follow up as appropriate after speaking with pt sister.   SDOH Screenings   Tobacco Use: Unknown (03/23/2023)   Anthony HILARIO Leech, LCSW Clinical Social Worker [facility] Desk#: [phone] Cell#: [phone]

## 2023-04-03 ENCOUNTER — Ambulatory Visit (HOSPITAL_BASED_OUTPATIENT_CLINIC_OR_DEPARTMENT_OTHER): Payer: Medicare Other

## 2023-04-03 ENCOUNTER — Ambulatory Visit (HOSPITAL_COMMUNITY): Payer: Medicare Other | Attending: Cardiology

## 2023-04-03 DIAGNOSIS — R072 Precordial pain: Secondary | ICD-10-CM | POA: Insufficient documentation

## 2023-04-03 LAB — MYOCARDIAL PERFUSION IMAGING
LV dias vol: 56 mL (ref 62–150)
LV sys vol: 19 mL
Nuc Stress EF: 66 %
Peak HR: 86 {beats}/min
Rest HR: 51 {beats}/min
Rest Nuclear Isotope Dose: 10.7 mCi
SDS: 1
SRS: 0
SSS: 1
ST Depression (mm): 0 mm
Stress Nuclear Isotope Dose: 32.4 mCi
TID: 1.03

## 2023-04-03 LAB — ECHOCARDIOGRAM COMPLETE
Area-P 1/2: 2.97 cm2
P 1/2 time: 1012 ms
S' Lateral: 2.7 cm

## 2023-04-03 MED ORDER — TECHNETIUM TC 99M TETROFOSMIN IV KIT
32.4000 | PACK | Freq: Once | INTRAVENOUS | Status: AC | PRN
  Administered 2023-04-03: 32.4 via INTRAVENOUS

## 2023-04-03 MED ORDER — TECHNETIUM TC 99M TETROFOSMIN IV KIT
10.7000 | PACK | Freq: Once | INTRAVENOUS | Status: AC | PRN
  Administered 2023-04-03: 10.7 via INTRAVENOUS

## 2023-04-03 MED ORDER — REGADENOSON 0.4 MG/5ML IV SOLN
0.4000 mg | Freq: Once | INTRAVENOUS | Status: AC
Start: 2023-04-03 — End: 2023-04-03
  Administered 2023-04-03: 0.4 mg via INTRAVENOUS

## 2023-04-05 DIAGNOSIS — M47896 Other spondylosis, lumbar region: Secondary | ICD-10-CM | POA: Diagnosis not present

## 2023-04-05 DIAGNOSIS — M791 Myalgia, unspecified site: Secondary | ICD-10-CM | POA: Diagnosis not present

## 2023-04-12 ENCOUNTER — Emergency Department (HOSPITAL_COMMUNITY): Payer: Medicare Other

## 2023-04-12 ENCOUNTER — Other Ambulatory Visit: Payer: Self-pay

## 2023-04-12 ENCOUNTER — Inpatient Hospital Stay (HOSPITAL_COMMUNITY)
Admission: EM | Admit: 2023-04-12 | Discharge: 2023-05-15 | DRG: 193 | Disposition: A | Discharge status: Skilled Nursing Facility | Payer: Medicare Other | Source: Ambulatory Visit | Attending: Internal Medicine | Admitting: Internal Medicine

## 2023-04-12 ENCOUNTER — Encounter (HOSPITAL_COMMUNITY): Payer: Self-pay | Admitting: Emergency Medicine

## 2023-04-12 DIAGNOSIS — R1310 Dysphagia, unspecified: Secondary | ICD-10-CM

## 2023-04-12 DIAGNOSIS — J09X1 Influenza due to identified novel influenza A virus with pneumonia: Principal | ICD-10-CM | POA: Diagnosis present

## 2023-04-12 DIAGNOSIS — Z781 Physical restraint status: Secondary | ICD-10-CM

## 2023-04-12 DIAGNOSIS — I10 Essential (primary) hypertension: Secondary | ICD-10-CM | POA: Diagnosis not present

## 2023-04-12 DIAGNOSIS — Z602 Problems related to living alone: Secondary | ICD-10-CM | POA: Diagnosis present

## 2023-04-12 DIAGNOSIS — K229 Disease of esophagus, unspecified: Secondary | ICD-10-CM | POA: Diagnosis present

## 2023-04-12 DIAGNOSIS — N201 Calculus of ureter: Secondary | ICD-10-CM | POA: Diagnosis present

## 2023-04-12 DIAGNOSIS — E039 Hypothyroidism, unspecified: Secondary | ICD-10-CM | POA: Diagnosis not present

## 2023-04-12 DIAGNOSIS — R350 Frequency of micturition: Secondary | ICD-10-CM | POA: Diagnosis present

## 2023-04-12 DIAGNOSIS — K56600 Partial intestinal obstruction, unspecified as to cause: Secondary | ICD-10-CM | POA: Diagnosis present

## 2023-04-12 DIAGNOSIS — J449 Chronic obstructive pulmonary disease, unspecified: Secondary | ICD-10-CM | POA: Diagnosis present

## 2023-04-12 DIAGNOSIS — Z681 Body mass index (BMI) 19 or less, adult: Secondary | ICD-10-CM

## 2023-04-12 DIAGNOSIS — R1311 Dysphagia, oral phase: Principal | ICD-10-CM | POA: Diagnosis present

## 2023-04-12 DIAGNOSIS — K56609 Unspecified intestinal obstruction, unspecified as to partial versus complete obstruction: Secondary | ICD-10-CM | POA: Diagnosis not present

## 2023-04-12 DIAGNOSIS — Z791 Long term (current) use of non-steroidal anti-inflammatories (NSAID): Secondary | ICD-10-CM

## 2023-04-12 DIAGNOSIS — R918 Other nonspecific abnormal finding of lung field: Secondary | ICD-10-CM | POA: Diagnosis not present

## 2023-04-12 DIAGNOSIS — R0602 Shortness of breath: Secondary | ICD-10-CM | POA: Diagnosis not present

## 2023-04-12 DIAGNOSIS — I2510 Atherosclerotic heart disease of native coronary artery without angina pectoris: Secondary | ICD-10-CM | POA: Diagnosis not present

## 2023-04-12 DIAGNOSIS — K219 Gastro-esophageal reflux disease without esophagitis: Secondary | ICD-10-CM | POA: Diagnosis present

## 2023-04-12 DIAGNOSIS — Z1152 Encounter for screening for COVID-19: Secondary | ICD-10-CM | POA: Diagnosis not present

## 2023-04-12 DIAGNOSIS — R109 Unspecified abdominal pain: Secondary | ICD-10-CM | POA: Diagnosis not present

## 2023-04-12 DIAGNOSIS — E876 Hypokalemia: Secondary | ICD-10-CM | POA: Diagnosis not present

## 2023-04-12 DIAGNOSIS — Z79899 Other long term (current) drug therapy: Secondary | ICD-10-CM

## 2023-04-12 DIAGNOSIS — N132 Hydronephrosis with renal and ureteral calculous obstruction: Secondary | ICD-10-CM | POA: Diagnosis not present

## 2023-04-12 DIAGNOSIS — J189 Pneumonia, unspecified organism: Secondary | ICD-10-CM | POA: Diagnosis not present

## 2023-04-12 DIAGNOSIS — J440 Chronic obstructive pulmonary disease with acute lower respiratory infection: Secondary | ICD-10-CM | POA: Diagnosis not present

## 2023-04-12 DIAGNOSIS — F0390 Unspecified dementia without behavioral disturbance: Secondary | ICD-10-CM | POA: Diagnosis present

## 2023-04-12 DIAGNOSIS — F05 Delirium due to known physiological condition: Secondary | ICD-10-CM | POA: Diagnosis present

## 2023-04-12 DIAGNOSIS — Z751 Person awaiting admission to adequate facility elsewhere: Secondary | ICD-10-CM | POA: Diagnosis not present

## 2023-04-12 DIAGNOSIS — F03911 Unspecified dementia, unspecified severity, with agitation: Secondary | ICD-10-CM | POA: Diagnosis not present

## 2023-04-12 DIAGNOSIS — Z79890 Hormone replacement therapy (postmenopausal): Secondary | ICD-10-CM

## 2023-04-12 DIAGNOSIS — J9601 Acute respiratory failure with hypoxia: Secondary | ICD-10-CM | POA: Diagnosis present

## 2023-04-12 DIAGNOSIS — R059 Cough, unspecified: Secondary | ICD-10-CM | POA: Diagnosis not present

## 2023-04-12 DIAGNOSIS — E872 Acidosis, unspecified: Secondary | ICD-10-CM | POA: Diagnosis not present

## 2023-04-12 DIAGNOSIS — J439 Emphysema, unspecified: Secondary | ICD-10-CM | POA: Diagnosis not present

## 2023-04-12 DIAGNOSIS — N401 Enlarged prostate with lower urinary tract symptoms: Secondary | ICD-10-CM | POA: Diagnosis present

## 2023-04-12 DIAGNOSIS — J101 Influenza due to other identified influenza virus with other respiratory manifestations: Principal | ICD-10-CM

## 2023-04-12 DIAGNOSIS — R0689 Other abnormalities of breathing: Secondary | ICD-10-CM | POA: Diagnosis not present

## 2023-04-12 DIAGNOSIS — J1000 Influenza due to other identified influenza virus with unspecified type of pneumonia: Secondary | ICD-10-CM | POA: Diagnosis not present

## 2023-04-12 DIAGNOSIS — R351 Nocturia: Secondary | ICD-10-CM | POA: Diagnosis present

## 2023-04-12 DIAGNOSIS — Z743 Need for continuous supervision: Secondary | ICD-10-CM | POA: Diagnosis not present

## 2023-04-12 DIAGNOSIS — E43 Unspecified severe protein-calorie malnutrition: Secondary | ICD-10-CM | POA: Diagnosis not present

## 2023-04-12 DIAGNOSIS — N400 Enlarged prostate without lower urinary tract symptoms: Secondary | ICD-10-CM | POA: Diagnosis present

## 2023-04-12 LAB — CBC WITH DIFFERENTIAL/PLATELET
Abs Immature Granulocytes: 0.11 10*3/uL — ABNORMAL HIGH (ref 0.00–0.07)
Basophils Absolute: 0.1 10*3/uL (ref 0.0–0.1)
Basophils Relative: 0 %
Eosinophils Absolute: 0 10*3/uL (ref 0.0–0.5)
Eosinophils Relative: 0 %
HCT: 47.8 % (ref 39.0–52.0)
Hemoglobin: 16 g/dL (ref 13.0–17.0)
Immature Granulocytes: 1 %
Lymphocytes Relative: 10 %
Lymphs Abs: 1.9 10*3/uL (ref 0.7–4.0)
MCH: 29.1 pg (ref 26.0–34.0)
MCHC: 33.5 g/dL (ref 30.0–36.0)
MCV: 87.1 fL (ref 80.0–100.0)
Monocytes Absolute: 1.3 10*3/uL — ABNORMAL HIGH (ref 0.1–1.0)
Monocytes Relative: 7 %
Neutro Abs: 16 10*3/uL — ABNORMAL HIGH (ref 1.7–7.7)
Neutrophils Relative %: 82 %
Platelets: 322 10*3/uL (ref 150–400)
RBC: 5.49 MIL/uL (ref 4.22–5.81)
RDW: 13.9 % (ref 11.5–15.5)
WBC: 19.4 10*3/uL — ABNORMAL HIGH (ref 4.0–10.5)
nRBC: 0 % (ref 0.0–0.2)

## 2023-04-12 LAB — I-STAT VENOUS BLOOD GAS, ED
Acid-Base Excess: 2 mmol/L (ref 0.0–2.0)
Bicarbonate: 26.6 mmol/L (ref 20.0–28.0)
Calcium, Ion: 1.13 mmol/L — ABNORMAL LOW (ref 1.15–1.40)
HCT: 49 % (ref 39.0–52.0)
Hemoglobin: 16.7 g/dL (ref 13.0–17.0)
O2 Saturation: 72 %
Potassium: 3.7 mmol/L (ref 3.5–5.1)
Sodium: 135 mmol/L (ref 135–145)
TCO2: 28 mmol/L (ref 22–32)
pCO2, Ven: 41.3 mm[Hg] — ABNORMAL LOW (ref 44–60)
pH, Ven: 7.417 (ref 7.25–7.43)
pO2, Ven: 37 mm[Hg] (ref 32–45)

## 2023-04-12 LAB — TROPONIN I (HIGH SENSITIVITY)
Troponin I (High Sensitivity): 10 ng/L (ref <18)
Troponin I (High Sensitivity): 9 ng/L (ref <18)

## 2023-04-12 LAB — COMPREHENSIVE METABOLIC PANEL
ALT: 14 U/L (ref 0–44)
AST: 28 U/L (ref 15–41)
Albumin: 3.2 g/dL — ABNORMAL LOW (ref 3.5–5.0)
Alkaline Phosphatase: 71 U/L (ref 38–126)
Anion gap: 13 (ref 5–15)
BUN: 30 mg/dL — ABNORMAL HIGH (ref 8–23)
CO2: 24 mmol/L (ref 22–32)
Calcium: 9.1 mg/dL (ref 8.9–10.3)
Chloride: 99 mmol/L (ref 98–111)
Creatinine, Ser: 1.12 mg/dL (ref 0.61–1.24)
GFR, Estimated: >60 mL/min (ref >60)
Glucose, Bld: 125 mg/dL — ABNORMAL HIGH (ref 70–99)
Potassium: 3.8 mmol/L (ref 3.5–5.1)
Sodium: 136 mmol/L (ref 135–145)
Total Bilirubin: 0.9 mg/dL (ref 0.0–1.2)
Total Protein: 6.6 g/dL (ref 6.5–8.1)

## 2023-04-12 LAB — RESP PANEL BY RT-PCR (RSV, FLU A&B, COVID)  RVPGX2
Influenza A by PCR: POSITIVE — AB
Influenza B by PCR: NEGATIVE
Resp Syncytial Virus by PCR: NEGATIVE
SARS Coronavirus 2 by RT PCR: NEGATIVE

## 2023-04-12 LAB — BRAIN NATRIURETIC PEPTIDE: B Natriuretic Peptide: 45.9 pg/mL (ref 0.0–100.0)

## 2023-04-12 LAB — I-STAT CG4 LACTIC ACID, ED
Lactic Acid, Venous: 2.9 mmol/L (ref 0.5–1.9)
Lactic Acid, Venous: 1.6 mmol/L (ref 0.5–1.9)

## 2023-04-12 MED ORDER — SODIUM CHLORIDE 0.9 % IV SOLN
1.0000 g | Freq: Once | INTRAVENOUS | Status: AC
  Administered 2023-04-13: 1 g via INTRAVENOUS
  Filled 2023-04-12: qty 10

## 2023-04-12 MED ORDER — SODIUM CHLORIDE 0.9 % IV SOLN
500.0000 mg | Freq: Once | INTRAVENOUS | Status: AC
  Administered 2023-04-13: 500 mg via INTRAVENOUS
  Filled 2023-04-12: qty 5

## 2023-04-12 MED ORDER — IPRATROPIUM-ALBUTEROL 0.5-2.5 (3) MG/3ML IN SOLN
3.0000 mL | Freq: Once | RESPIRATORY_TRACT | Status: AC
  Administered 2023-04-12: 3 mL via RESPIRATORY_TRACT
  Filled 2023-04-12: qty 3

## 2023-04-12 NOTE — ED Provider Notes (Signed)
[place] EMERGENCY DEPARTMENT AT [facility] Provider Note   CSN: [id number] Arrival date & time: 04/12/23  1741     History  Chief Complaint  Patient presents with   Shortness of Breath    Anthony Ball is a 89 y.o. male.  HPI Review of EMR indicates history of COPD and patient is having difficulty recalling symptoms of brought him to the emergency department.  He endorses a cough.  He denies he uses home oxygen.  Patient is a fairly limited historian.  He is denying any chest pain he does not endorse fever.  I contacted the 2 emergency contact numbers.  1 was the patient's sister and brother-in-law.  They had not seen the patient recently and did not have much additional information.  They advised that the other contact lives across from the patient that his sister.  I called and left a message without additional information currently.    Home Medications Prior to Admission medications   Medication Sig Start Date End Date Taking? Authorizing Provider  albuterol (PROVENTIL HFA;VENTOLIN HFA) 108 (90 BASE) MCG/ACT inhaler Inhale 2 puffs into the lungs every 4 (four) hours as needed for wheezing or shortness of breath. 04/13/12   Eber Hong, MD  levothyroxine (SYNTHROID) 25 MCG tablet Take 25 mcg by mouth daily before breakfast.    [provider]  meloxicam (MOBIC) 15 MG tablet Take 15 mg by mouth daily. 02/26/23   [provider]  memantine (NAMENDA) 10 MG tablet Take 10 mg by mouth daily.    [provider]  Omega-3 Fatty Acids (FISH OIL) 1000 MG CAPS Take 1 capsule by mouth daily.    [provider]  omeprazole (PRILOSEC) 20 MG capsule Take 20 mg by mouth 2 (two) times daily.     [provider]  Ethelda Chick Calcium 500 MG TABS Take 1 tablet by mouth daily.    [provider]  triamcinolone cream (KENALOG) 0.1 % Apply 1 application topically every other day. Apply to great areas of body    [provider]       Allergies    Patient has no known allergies.    Review of Systems   Review of Systems  Physical Exam Updated Vital Signs BP 134/87 (BP Location: Right Arm)   Pulse 92   Temp 98.9 F (37.2 C) (Axillary)   Resp (!) 25   Ht 5\' 6"  (1.676 m)   Wt 61 kg   SpO2 96%   BMI 21.71 kg/m  Physical Exam Constitutional:      Comments: Awake and alert.  Frail appearance.  No respiratory distress at rest.  Intermittent wet cough.  HENT:     Head: Normocephalic and atraumatic.     Mouth/Throat:     Pharynx: Oropharynx is clear.  Eyes:     Extraocular Movements: Extraocular movements intact.  Cardiovascular:     Rate and Rhythm: Regular rhythm. Tachycardia present.  Pulmonary:     Comments: Mild increased work of breathing.  Occasional wet cough.  Crackles and occasional wheeze bilateral lung fields Abdominal:     General: Abdomen is flat. There is no distension.     Tenderness: There is no abdominal tenderness. There is no guarding.  Musculoskeletal:     Comments: No peripheral edema.  No calf tenderness no active wounds of the feet.  Skin:    General: Skin is warm and dry.  Neurological:     Comments: Patient is awake and alert.  Speech seems somewhat rambling.  He is answering some questions that are oriented but he seems to have very poor recall consistent with dementia noted in the chart.  No focal motor deficits.  Assist in following commands.     ED Results / Procedures / Treatments   Labs (all labs ordered are listed, but only abnormal results are displayed) Labs Reviewed  RESP PANEL BY RT-PCR (RSV, FLU A&B, COVID)  RVPGX2 - Abnormal; Notable for the following components:      Result Value   Influenza A by PCR POSITIVE (*)    All other components within normal limits  COMPREHENSIVE METABOLIC PANEL - Abnormal; Notable for the following components:   Glucose, Bld 125 (*)    BUN 30 (*)    Albumin 3.2 (*)    All other components within normal limits  CBC WITH  DIFFERENTIAL/PLATELET - Abnormal; Notable for the following components:   WBC 19.4 (*)    Neutro Abs 16.0 (*)    Monocytes Absolute 1.3 (*)    Abs Immature Granulocytes 0.11 (*)    All other components within normal limits  I-STAT VENOUS BLOOD GAS, ED - Abnormal; Notable for the following components:   pCO2, Ven 41.3 (*)    Calcium, Ion 1.13 (*)    All other components within normal limits  I-STAT CG4 LACTIC ACID, ED - Abnormal; Notable for the following components:   Lactic Acid, Venous 2.9 (*)    All other components within normal limits  CULTURE, BLOOD (ROUTINE X 2)  CULTURE, BLOOD (ROUTINE X 2)  BRAIN NATRIURETIC PEPTIDE  URINALYSIS, ROUTINE W REFLEX MICROSCOPIC  I-STAT CG4 LACTIC ACID, ED  TROPONIN I (HIGH SENSITIVITY)  TROPONIN I (HIGH SENSITIVITY)    EKG None  Radiology DG Chest Port 1 View Result Date: 04/12/2023 CLINICAL DATA:  Cough, shortness of breath EXAM: PORTABLE CHEST 1 VIEW COMPARISON:  03/16/2023 FINDINGS: Heart and mediastinal contours within normal limits. Airspace opacity in the right lower lobe. Left lung clear. No effusions or acute bony abnormality. IMPRESSION: Right lower lobe airspace opacity concerning for pneumonia. Electronically Signed   By: Charlett Nose M.D.   On: 04/12/2023 19:05    Procedures Procedures    Medications Ordered in ED Medications  cefTRIAXone (ROCEPHIN) 1 g in sodium chloride 0.9 % 100 mL IVPB (has no administration in time range)  azithromycin (ZITHROMAX) 500 mg in sodium chloride 0.9 % 250 mL IVPB (has no administration in time range)  ipratropium-albuterol (DUONEB) 0.5-2.5 (3) MG/3ML nebulizer solution 3 mL (3 mLs Nebulization Given 04/12/23 2039)    ED Course/ Medical Decision Making/ A&P                                 Medical Decision Making Amount and/or Complexity of Data Reviewed Labs: ordered. Radiology: ordered.  Risk Prescription drug management. Decision regarding hospitalization.   Patient presents as  outlined.  At this time it appears to be shortness of breath and cough that are brought the patient to the emergency department.  He does have history of COPD and dementia.  Patient apparently had presented to urgent care for cough that started a week ago and was now hypoxic to 84% by their evaluation.  Patient was given 1 DuoNeb and 125 mg of Solu-Medrol.  At this time we will proceed with diagnostic evaluation including chest x-ray, respiratory panel, blood work.  Patient continues have crackles and wheeze will repeat in  nebulizer therapy while awaiting diagnostic results.  I tried both contacts and have updated the patient's sister and brother-in-law, second contact left message.  Chest x-ray positive for infiltrate right lower lobe interpreted by radiology  Patient has influenza A positive respiratory panel, 19,000 white count.  At this time I suspect influenza with secondary pneumonia.  Patient has confusion which at baseline may be dementia but lives alone and with pneumonia, hypoxia and advanced age, plan will be for admission with IV antibiotics.  Consult: Dr. Toniann Fail [facility] hospitalist for admission.        Final Clinical Impression(s) / ED Diagnoses Final diagnoses:  Influenza A  Community acquired pneumonia of right lower lobe of lung    Rx / DC Orders ED Discharge Orders     None         Arby Barrette, MD 04/12/23 2352

## 2023-04-12 NOTE — ED Triage Notes (Addendum)
Pt BIB [facility] from urgent care due to shortness of breath that started one week ago with cough and congestion.  Cough that is productive and Doss in color.  Pt does have dementia and COPD.  Pt 84% RA.  Pt given 1 duoneb, 5 albuterol and  0.5 atrovent.  125 solumedrol given en route.  20g left forearm.   [facility] VS BP 126/82, HR 84, SpO2 98%

## 2023-04-12 NOTE — ED Notes (Signed)
Sister [name] left phone number [phone] for contact.

## 2023-04-13 ENCOUNTER — Inpatient Hospital Stay (HOSPITAL_COMMUNITY): Payer: Medicare Other

## 2023-04-13 ENCOUNTER — Observation Stay (HOSPITAL_COMMUNITY): Payer: Medicare Other

## 2023-04-13 ENCOUNTER — Encounter (HOSPITAL_COMMUNITY): Payer: Self-pay | Admitting: Internal Medicine

## 2023-04-13 DIAGNOSIS — R1310 Dysphagia, unspecified: Secondary | ICD-10-CM | POA: Diagnosis not present

## 2023-04-13 DIAGNOSIS — J440 Chronic obstructive pulmonary disease with acute lower respiratory infection: Secondary | ICD-10-CM | POA: Diagnosis present

## 2023-04-13 DIAGNOSIS — E43 Unspecified severe protein-calorie malnutrition: Secondary | ICD-10-CM | POA: Diagnosis not present

## 2023-04-13 DIAGNOSIS — K5730 Diverticulosis of large intestine without perforation or abscess without bleeding: Secondary | ICD-10-CM | POA: Diagnosis not present

## 2023-04-13 DIAGNOSIS — F0390 Unspecified dementia without behavioral disturbance: Secondary | ICD-10-CM | POA: Diagnosis present

## 2023-04-13 DIAGNOSIS — J189 Pneumonia, unspecified organism: Secondary | ICD-10-CM

## 2023-04-13 DIAGNOSIS — K5669 Other partial intestinal obstruction: Secondary | ICD-10-CM | POA: Diagnosis not present

## 2023-04-13 DIAGNOSIS — K56600 Partial intestinal obstruction, unspecified as to cause: Secondary | ICD-10-CM | POA: Diagnosis present

## 2023-04-13 DIAGNOSIS — K56609 Unspecified intestinal obstruction, unspecified as to partial versus complete obstruction: Secondary | ICD-10-CM | POA: Diagnosis not present

## 2023-04-13 DIAGNOSIS — R1311 Dysphagia, oral phase: Secondary | ICD-10-CM | POA: Diagnosis not present

## 2023-04-13 DIAGNOSIS — J449 Chronic obstructive pulmonary disease, unspecified: Secondary | ICD-10-CM | POA: Diagnosis present

## 2023-04-13 DIAGNOSIS — N130 Hydronephrosis with ureteropelvic junction obstruction: Secondary | ICD-10-CM | POA: Diagnosis not present

## 2023-04-13 DIAGNOSIS — Z602 Problems related to living alone: Secondary | ICD-10-CM | POA: Diagnosis present

## 2023-04-13 DIAGNOSIS — E039 Hypothyroidism, unspecified: Secondary | ICD-10-CM | POA: Diagnosis present

## 2023-04-13 DIAGNOSIS — N401 Enlarged prostate with lower urinary tract symptoms: Secondary | ICD-10-CM | POA: Diagnosis present

## 2023-04-13 DIAGNOSIS — Z751 Person awaiting admission to adequate facility elsewhere: Secondary | ICD-10-CM | POA: Diagnosis not present

## 2023-04-13 DIAGNOSIS — J09X1 Influenza due to identified novel influenza A virus with pneumonia: Secondary | ICD-10-CM | POA: Diagnosis not present

## 2023-04-13 DIAGNOSIS — Z1152 Encounter for screening for COVID-19: Secondary | ICD-10-CM | POA: Diagnosis not present

## 2023-04-13 DIAGNOSIS — R350 Frequency of micturition: Secondary | ICD-10-CM | POA: Diagnosis present

## 2023-04-13 DIAGNOSIS — N132 Hydronephrosis with renal and ureteral calculous obstruction: Secondary | ICD-10-CM | POA: Diagnosis not present

## 2023-04-13 DIAGNOSIS — R351 Nocturia: Secondary | ICD-10-CM | POA: Diagnosis present

## 2023-04-13 DIAGNOSIS — R140 Abdominal distension (gaseous): Secondary | ICD-10-CM | POA: Diagnosis not present

## 2023-04-13 DIAGNOSIS — Z4682 Encounter for fitting and adjustment of non-vascular catheter: Secondary | ICD-10-CM | POA: Diagnosis not present

## 2023-04-13 DIAGNOSIS — N201 Calculus of ureter: Secondary | ICD-10-CM

## 2023-04-13 DIAGNOSIS — E872 Acidosis, unspecified: Secondary | ICD-10-CM | POA: Diagnosis present

## 2023-04-13 DIAGNOSIS — R188 Other ascites: Secondary | ICD-10-CM | POA: Diagnosis not present

## 2023-04-13 DIAGNOSIS — E876 Hypokalemia: Secondary | ICD-10-CM | POA: Diagnosis present

## 2023-04-13 DIAGNOSIS — Z681 Body mass index (BMI) 19 or less, adult: Secondary | ICD-10-CM | POA: Diagnosis not present

## 2023-04-13 DIAGNOSIS — R531 Weakness: Secondary | ICD-10-CM | POA: Diagnosis not present

## 2023-04-13 DIAGNOSIS — K219 Gastro-esophageal reflux disease without esophagitis: Secondary | ICD-10-CM | POA: Diagnosis present

## 2023-04-13 DIAGNOSIS — K229 Disease of esophagus, unspecified: Secondary | ICD-10-CM | POA: Diagnosis present

## 2023-04-13 DIAGNOSIS — R918 Other nonspecific abnormal finding of lung field: Secondary | ICD-10-CM | POA: Diagnosis not present

## 2023-04-13 DIAGNOSIS — I10 Essential (primary) hypertension: Secondary | ICD-10-CM | POA: Diagnosis present

## 2023-04-13 DIAGNOSIS — F05 Delirium due to known physiological condition: Secondary | ICD-10-CM | POA: Diagnosis present

## 2023-04-13 DIAGNOSIS — J9601 Acute respiratory failure with hypoxia: Secondary | ICD-10-CM | POA: Diagnosis not present

## 2023-04-13 DIAGNOSIS — J1000 Influenza due to other identified influenza virus with unspecified type of pneumonia: Secondary | ICD-10-CM | POA: Diagnosis present

## 2023-04-13 DIAGNOSIS — J439 Emphysema, unspecified: Secondary | ICD-10-CM | POA: Diagnosis present

## 2023-04-13 DIAGNOSIS — K6389 Other specified diseases of intestine: Secondary | ICD-10-CM | POA: Diagnosis not present

## 2023-04-13 DIAGNOSIS — R0602 Shortness of breath: Secondary | ICD-10-CM | POA: Diagnosis not present

## 2023-04-13 DIAGNOSIS — K567 Ileus, unspecified: Secondary | ICD-10-CM | POA: Diagnosis not present

## 2023-04-13 DIAGNOSIS — I2510 Atherosclerotic heart disease of native coronary artery without angina pectoris: Secondary | ICD-10-CM | POA: Diagnosis present

## 2023-04-13 DIAGNOSIS — F03911 Unspecified dementia, unspecified severity, with agitation: Secondary | ICD-10-CM | POA: Diagnosis not present

## 2023-04-13 LAB — CBC WITH DIFFERENTIAL/PLATELET
Abs Immature Granulocytes: 0.12 10*3/uL — ABNORMAL HIGH (ref 0.00–0.07)
Basophils Absolute: 0 10*3/uL (ref 0.0–0.1)
Basophils Relative: 0 %
Eosinophils Absolute: 0 10*3/uL (ref 0.0–0.5)
Eosinophils Relative: 0 %
HCT: 44.3 % (ref 39.0–52.0)
Hemoglobin: 15 g/dL (ref 13.0–17.0)
Immature Granulocytes: 1 %
Lymphocytes Relative: 3 %
Lymphs Abs: 0.6 10*3/uL — ABNORMAL LOW (ref 0.7–4.0)
MCH: 29.2 pg (ref 26.0–34.0)
MCHC: 33.9 g/dL (ref 30.0–36.0)
MCV: 86.2 fL (ref 80.0–100.0)
Monocytes Absolute: 0.4 10*3/uL (ref 0.1–1.0)
Monocytes Relative: 2 %
Neutro Abs: 21.3 10*3/uL — ABNORMAL HIGH (ref 1.7–7.7)
Neutrophils Relative %: 94 %
Platelets: 320 10*3/uL (ref 150–400)
RBC: 5.14 MIL/uL (ref 4.22–5.81)
RDW: 14.1 % (ref 11.5–15.5)
WBC: 22.5 10*3/uL — ABNORMAL HIGH (ref 4.0–10.5)
nRBC: 0 % (ref 0.0–0.2)

## 2023-04-13 LAB — PROCALCITONIN: Procalcitonin: 0.44 ng/mL

## 2023-04-13 LAB — COMPREHENSIVE METABOLIC PANEL
ALT: 18 U/L (ref 0–44)
AST: 45 U/L — ABNORMAL HIGH (ref 15–41)
Albumin: 2.9 g/dL — ABNORMAL LOW (ref 3.5–5.0)
Alkaline Phosphatase: 70 U/L (ref 38–126)
Anion gap: 17 — ABNORMAL HIGH (ref 5–15)
BUN: 29 mg/dL — ABNORMAL HIGH (ref 8–23)
CO2: 22 mmol/L (ref 22–32)
Calcium: 9.3 mg/dL (ref 8.9–10.3)
Chloride: 99 mmol/L (ref 98–111)
Creatinine, Ser: 1.01 mg/dL (ref 0.61–1.24)
GFR, Estimated: >60 mL/min (ref >60)
Glucose, Bld: 164 mg/dL — ABNORMAL HIGH (ref 70–99)
Potassium: 3.7 mmol/L (ref 3.5–5.1)
Sodium: 138 mmol/L (ref 135–145)
Total Bilirubin: 0.8 mg/dL (ref 0.0–1.2)
Total Protein: 6.5 g/dL (ref 6.5–8.1)

## 2023-04-13 LAB — URINALYSIS, ROUTINE W REFLEX MICROSCOPIC
Bilirubin Urine: NEGATIVE
Glucose, UA: NEGATIVE mg/dL
Ketones, ur: 20 mg/dL — AB
Leukocytes,Ua: NEGATIVE
Nitrite: NEGATIVE
Protein, ur: 100 mg/dL — AB
Specific Gravity, Urine: 1.026 (ref 1.005–1.030)
pH: 5 (ref 5.0–8.0)

## 2023-04-13 LAB — CBG MONITORING, ED
Glucose-Capillary: 152 mg/dL — ABNORMAL HIGH (ref 70–99)
Glucose-Capillary: 150 mg/dL — ABNORMAL HIGH (ref 70–99)

## 2023-04-13 LAB — TSH: TSH: 2.652 u[IU]/mL (ref 0.350–4.500)

## 2023-04-13 LAB — HIV ANTIBODY (ROUTINE TESTING W REFLEX): HIV Screen 4th Generation wRfx: NONREACTIVE

## 2023-04-13 MED ORDER — SODIUM CHLORIDE 0.9 % IV SOLN
500.0000 mg | INTRAVENOUS | Status: AC
  Administered 2023-04-14 – 2023-04-18 (×5): 500 mg via INTRAVENOUS
  Filled 2023-04-13 (×5): qty 5

## 2023-04-13 MED ORDER — ACETAMINOPHEN 650 MG RE SUPP: 650.0000 mg | Freq: Four times a day (QID) | RECTAL | Status: DC | PRN

## 2023-04-13 MED ORDER — ONDANSETRON HCL 4 MG PO TABS: 4.0000 mg | ORAL_TABLET | Freq: Four times a day (QID) | ORAL | Status: DC | PRN

## 2023-04-13 MED ORDER — DEXTROSE IN LACTATED RINGERS 5 % IV SOLN
INTRAVENOUS | Status: AC
Start: 2023-04-13 — End: 2023-04-14

## 2023-04-13 MED ORDER — HALOPERIDOL LACTATE 5 MG/ML IJ SOLN
2.0000 mg | Freq: Four times a day (QID) | INTRAMUSCULAR | Status: DC | PRN
  Administered 2023-04-14 (×2): 2 mg via INTRAVENOUS
  Filled 2023-04-13 (×2): qty 1

## 2023-04-13 MED ORDER — DIATRIZOATE MEGLUMINE & SODIUM 66-10 % PO SOLN
90.0000 mL | Freq: Once | ORAL | Status: DC
  Filled 2023-04-13: qty 90

## 2023-04-13 MED ORDER — IPRATROPIUM-ALBUTEROL 0.5-2.5 (3) MG/3ML IN SOLN: 3.0000 mL | RESPIRATORY_TRACT | Status: DC | PRN

## 2023-04-13 MED ORDER — SODIUM CHLORIDE 0.9 % IV SOLN
2.0000 g | INTRAVENOUS | Status: AC
  Administered 2023-04-13 – 2023-04-17 (×5): 2 g via INTRAVENOUS
  Filled 2023-04-13 (×5): qty 20

## 2023-04-13 MED ORDER — TAMSULOSIN HCL 0.4 MG PO CAPS: 0.4000 mg | ORAL_CAPSULE | Freq: Every day | ORAL | Status: DC

## 2023-04-13 MED ORDER — ACETAMINOPHEN 325 MG PO TABS
650.0000 mg | ORAL_TABLET | Freq: Four times a day (QID) | ORAL | Status: DC | PRN
  Administered 2023-05-06 – 2023-05-07 (×2): 650 mg via ORAL
  Filled 2023-04-13 (×3): qty 2

## 2023-04-13 MED ORDER — ONDANSETRON HCL 4 MG/2ML IJ SOLN: 4.0000 mg | Freq: Four times a day (QID) | INTRAMUSCULAR | Status: DC | PRN

## 2023-04-13 NOTE — Progress Notes (Addendum)
Same day note  Anthony Ball is a 89 y.o. male with history of COPD, dementia, hypothyroidism patient to hospital with shortness of breath and productive cough.  Was a poor historian.  Of note patient was recently seen by cardiology and had a stress test done on 04/03/2023 which was unremarkable.    In the ED, patient was slightly tachycardic and tachypneic.  Was on 2 L of  2 L of oxygen.  Initial labs showed leukocytosis with WBC of 19.4.  Lactate was elevated at 2.9.  BNP of 45.  Patient tested positive for influenza A.  WBC was significantly elevated at 22.5.  Chest x-ray showed infiltrates.  During ED stay patient also had multiple visits of vomiting and had distention of the abdomen.  CT scan of the abdomen pelvis showed features concerning for bowel obstruction and chest CT showed pneumonia.  He also had right ureteric stone.  Patient was then admitted hospital for further evaluation and treatment.  Patient seen and examined at bedside.  Patient was admitted to the hospital for shortness of breath nausea vomiting.  At the time of my evaluation, patient complains of  Physical examination reveals  Laboratory data and imaging was reviewed  Assessment and Plan.  Small bowel obstruction -    as per CT scan of the abdomen.  Patient had abdominal distention nausea vomiting.  Currently on NG tube with intermittent suction.  General surgery has been consulted and recommended SBP protocol.  Follow recommendations.  Continue IV hydration with D5 Ringer lactate.  Analgesics antiemetics as necessary.  Pneumonia with influenza A positive, has significant leukocytosis.  Will add procalcitonin.  Getting antibiotic at this time.  Right UPJ stone with right-sided hydronephrosis.  Discussed with on-call urologist Dr. Shirlean Mylar.  Who advised to get UA and if infected to call them back.  UA is pending.  Will get EKG urinalysis and urine culture stat.  Creatinine of 1.0 at this time.  COPD continue  bronchodilators with nebulizers.  Diffuse esophageal thickening seen on the CAT scan will eventually need EGD.  Dementia takes Namenda at home.  Presently NPO.  Will restart when clinically appropriate  Hypothyroidism takes Synthroid at home.  Will hold off for now due to n.p.o. status..   No Charge  Signed,  Tenny Craw, MD [facility]

## 2023-04-13 NOTE — Consult Note (Signed)
CC: Unable to obtain  Requesting provider: Dr Toniann Fail  HPI: Anthony Ball is an 89 y.o. male who is here for shortness of breath.  He also complained of a cough.  The patient has dementia and is a poor historian.  In the ER he was found to have some mild hypoxia requiring oxygen and a chest x-ray revealed infiltrates and a leukocytosis.  He was started on antibiotics for pneumonia.  He was found to have influenza type A.  On CT chest abdomen pelvis there was evidence of consolidation and pneumonia and there was also dilated small bowel concerning for potential bowel obstruction.  Patient has had prior prostate surgery.  He is unable to tell me any other significant history.  He currently has a nasogastric tube in.  He states that he is having some abdominal discomfort but overall feels better.  Again it is hard to get history from him.  Past Medical History:  Diagnosis Date   COPD (chronic obstructive pulmonary disease) (HCC)    Emphysema    GERD (gastroesophageal reflux disease)    Seasonal allergies     Past Surgical History:  Procedure Laterality Date   CARDIAC CATHETERIZATION     PROSTATE SURGERY      Family History  Family history unknown: Yes    Social:  reports that he has never smoked. He does not have any smokeless tobacco history on file. He reports that he does not drink alcohol and does not use drugs.  Allergies: No Known Allergies  Medications: Reviewed the outpatient medication list and the inpatient medication list   ROS -obtained due to mental status PE Blood pressure (!) 140/84, pulse 84, temperature 98 F (36.7 C), resp. rate 20, height 5\' 6"  (1.676 m), weight 61 kg, SpO2 94%. Constitutional: NAD; conversant; no deformities Eyes: Moist conjunctiva; no lid lag; anicteric; PERRL Neck: Trachea midline; no thyromegaly Lungs: Normal respiratory effort;  CV: RRR; no palpable thrills; no pitting edema GI: Abd soft, some distension, no guarding/rebound; ; no  palpable hepatosplenomegaly MSK: no clubbing/cyanosis Psychiatric: Appropriate affect; pleasantly confused Lymphatic: No palpable cervical or axillary lymphadenopathy Skin:no obvious rash  Results for orders placed or performed during the hospital encounter of 04/12/23 (from the past 48 hours)  Brain natriuretic peptide     Status: None   Collection Time: 04/12/23  6:50 PM  Result Value Ref Range   B Natriuretic Peptide 45.9 0.0 - 100.0 pg/mL    Comment: Performed at [facility], [address], [place], [place] [address]  Comprehensive metabolic panel     Status: Abnormal   Collection Time: 04/12/23  6:50 PM  Result Value Ref Range   Sodium 136 135 - 145 mmol/L   Potassium 3.8 3.5 - 5.1 mmol/L   Chloride 99 98 - 111 mmol/L   CO2 24 22 - 32 mmol/L   Glucose, Bld 125 (H) 70 - 99 mg/dL    Comment: Glucose reference range applies only to samples taken after fasting for at least 8 hours.   BUN 30 (H) 8 - 23 mg/dL   Creatinine, Ser 2.13 0.61 - 1.24 mg/dL   Calcium 9.1 8.9 - 08.6 mg/dL   Total Protein 6.6 6.5 - 8.1 g/dL   Albumin 3.2 (L) 3.5 - 5.0 g/dL   AST 28 15 - 41 U/L   ALT 14 0 - 44 U/L   Alkaline Phosphatase 71 38 - 126 U/L   Total Bilirubin 0.9 0.0 - 1.2 mg/dL   GFR,  Estimated >60 >60 mL/min    Comment: (NOTE) Calculated using the CKD-EPI Creatinine Equation (2021)    Anion gap 13 5 - 15    Comment: Performed at [facility], [address] [place], [place] [address]  Troponin I (High Sensitivity)     Status: None   Collection Time: 04/12/23  6:50 PM  Result Value Ref Range   Troponin I (High Sensitivity) 10 <18 ng/L    Comment: (NOTE) Elevated high sensitivity troponin I (hsTnI) values and significant  changes across serial measurements may suggest ACS but many other  chronic and acute conditions are known to elevate hsTnI results.  Refer to the "Links" section for chest pain algorithms and additional  guidance. Performed at [facility], [address] [place], [place] [address]   CBC with Differential     Status: Abnormal   Collection Time: 04/12/23  6:50 PM  Result Value Ref Range   WBC 19.4 (H) 4.0 - 10.5 K/uL   RBC 5.49 4.22 - 5.81 MIL/uL   Hemoglobin 16.0 13.0 - 17.0 g/dL   HCT 09.8 11.9 - 14.7 %   MCV 87.1 80.0 - 100.0 fL   MCH 29.1 26.0 - 34.0 pg   MCHC 33.5 30.0 - 36.0 g/dL   RDW 82.9 56.2 - 13.0 %   Platelets 322 150 - 400 K/uL   nRBC 0.0 0.0 - 0.2 %   Neutrophils Relative % 82 %   Neutro Abs 16.0 (H) 1.7 - 7.7 K/uL   Lymphocytes Relative 10 %   Lymphs Abs 1.9 0.7 - 4.0 K/uL   Monocytes Relative 7 %   Monocytes Absolute 1.3 (H) 0.1 - 1.0 K/uL   Eosinophils Relative 0 %   Eosinophils Absolute 0.0 0.0 - 0.5 K/uL   Basophils Relative 0 %   Basophils Absolute 0.1 0.0 - 0.1 K/uL   Immature Granulocytes 1 %   Abs Immature Granulocytes 0.11 (H) 0.00 - 0.07 K/uL    Comment: Performed at [facility], [address] [place], [place] [address]  Resp panel by RT-PCR (RSV, Flu A&B, Covid) Anterior Nasal Swab     Status: Abnormal   Collection Time: 04/12/23  6:57 PM   Specimen: Anterior Nasal Swab  Result Value Ref Range   SARS Coronavirus 2 by RT PCR NEGATIVE NEGATIVE   Influenza A by PCR POSITIVE (A) NEGATIVE   Influenza B by PCR NEGATIVE NEGATIVE    Comment: (NOTE) The Xpert Xpress SARS-CoV-2/FLU/RSV plus assay is intended as an aid in the diagnosis of influenza from Nasopharyngeal swab specimens and should not be used as a sole basis for treatment. Nasal washings and aspirates are unacceptable for Xpert Xpress SARS-CoV-2/FLU/RSV testing.  Fact Sheet for Patients: BloggerCourse.com  Fact Sheet for Healthcare Providers: SeriousBroker.it  This test is not yet approved or cleared by the [place] FDA and has been authorized for detection and/or diagnosis of SARS-CoV-2 by FDA under an Emergency Use Authorization (EUA). This EUA will remain in effect  (meaning this test can be used) for the duration of the COVID-19 declaration under Section 564(b)(1) of the Act, 21 U.S.C. section 360bbb-3(b)(1), unless the authorization is terminated or revoked.     Resp Syncytial Virus by PCR NEGATIVE NEGATIVE    Comment: (NOTE) Fact Sheet for Patients: BloggerCourse.com  Fact Sheet for Healthcare Providers: SeriousBroker.it  This test is not yet approved or cleared by the [place] FDA and has been authorized for detection and/or diagnosis of  SARS-CoV-2 by FDA under an Emergency Use Authorization (EUA). This EUA will remain in effect (meaning this test can be used) for the duration of the COVID-19 declaration under Section 564(b)(1) of the Act, 21 U.S.C. section 360bbb-3(b)(1), unless the authorization is terminated or revoked.  Performed at [facility], [address], [place], [place] [address]   I-Stat venous blood gas, ED     Status: Abnormal   Collection Time: 04/12/23  7:00 PM  Result Value Ref Range   pH, Ven 7.417 7.25 - 7.43   pCO2, Ven 41.3 (L) 44 - 60 mmHg   pO2, Ven 37 32 - 45 mmHg   Bicarbonate 26.6 20.0 - 28.0 mmol/L   TCO2 28 22 - 32 mmol/L   O2 Saturation 72 %   Acid-Base Excess 2.0 0.0 - 2.0 mmol/L   Sodium 135 135 - 145 mmol/L   Potassium 3.7 3.5 - 5.1 mmol/L   Calcium, Ion 1.13 (L) 1.15 - 1.40 mmol/L   HCT 49.0 39.0 - 52.0 %   Hemoglobin 16.7 13.0 - 17.0 g/dL   Sample type VENOUS    Comment NOTIFIED PHYSICIAN   I-Stat Lactic Acid     Status: Abnormal   Collection Time: 04/12/23  7:03 PM  Result Value Ref Range   Lactic Acid, Venous 2.9 (HH) 0.5 - 1.9 mmol/L   Comment NOTIFIED PHYSICIAN   Troponin I (High Sensitivity)     Status: None   Collection Time: 04/12/23  9:13 PM  Result Value Ref Range   Troponin I (High Sensitivity) 9 <18 ng/L    Comment: (NOTE) Elevated high sensitivity troponin I (hsTnI) values and significant  changes across serial  measurements may suggest ACS but many other  chronic and acute conditions are known to elevate hsTnI results.  Refer to the "Links" section for chest pain algorithms and additional  guidance. Performed at [facility], [address] [place], [place] [address]   I-Stat Lactic Acid     Status: None   Collection Time: 04/12/23  9:34 PM  Result Value Ref Range   Lactic Acid, Venous 1.6 0.5 - 1.9 mmol/L  Culture, blood (routine x 2)     Status: None (Preliminary result)   Collection Time: 04/12/23 11:37 PM   Specimen: BLOOD  Result Value Ref Range   Specimen Description BLOOD SITE NOT SPECIFIED    Special Requests      BOTTLES DRAWN AEROBIC AND ANAEROBIC Blood Culture adequate volume   Culture      NO GROWTH < 12 HOURS Performed at [facility], [address], [place], [place] [address]    Report Status PENDING   Culture, blood (routine x 2)     Status: None (Preliminary result)   Collection Time: 04/12/23 11:58 PM   Specimen: BLOOD RIGHT ARM  Result Value Ref Range   Specimen Description BLOOD RIGHT ARM    Special Requests      BOTTLES DRAWN AEROBIC AND ANAEROBIC Blood Culture adequate volume   Culture      NO GROWTH < 12 HOURS Performed at [facility], [address], [place], [place] [address]    Report Status PENDING   Comprehensive metabolic panel     Status: Abnormal   Collection Time: 04/13/23  6:11 AM  Result Value Ref Range   Sodium 138 135 - 145 mmol/L   Potassium 3.7 3.5 - 5.1 mmol/L   Chloride 99 98 - 111 mmol/L   CO2 22  22 - 32 mmol/L   Glucose, Bld 164 (H) 70 - 99 mg/dL    Comment: Glucose reference range applies only to samples taken after fasting for at least 8 hours.   BUN 29 (H) 8 - 23 mg/dL   Creatinine, Ser 4.09 0.61 - 1.24 mg/dL   Calcium 9.3 8.9 - 81.1 mg/dL   Total Protein 6.5 6.5 - 8.1 g/dL   Albumin 2.9 (L) 3.5 - 5.0 g/dL   AST 45 (H) 15 - 41 U/L   ALT 18 0 - 44 U/L   Alkaline Phosphatase 70 38 - 126 U/L   Total Bilirubin  0.8 0.0 - 1.2 mg/dL   GFR, Estimated >91 >47 mL/min    Comment: (NOTE) Calculated using the CKD-EPI Creatinine Equation (2021)    Anion gap 17 (H) 5 - 15    Comment: Performed at [facility], [address] [place], [place] [address]  CBC with Differential/Platelet     Status: Abnormal   Collection Time: 04/13/23  6:11 AM  Result Value Ref Range   WBC 22.5 (H) 4.0 - 10.5 K/uL   RBC 5.14 4.22 - 5.81 MIL/uL   Hemoglobin 15.0 13.0 - 17.0 g/dL   HCT 21.3 08.6 - 57.8 %   MCV 86.2 80.0 - 100.0 fL   MCH 29.2 26.0 - 34.0 pg   MCHC 33.9 30.0 - 36.0 g/dL   RDW 46.9 62.9 - 52.8 %   Platelets 320 150 - 400 K/uL   nRBC 0.0 0.0 - 0.2 %   Neutrophils Relative % 94 %   Neutro Abs 21.3 (H) 1.7 - 7.7 K/uL   Lymphocytes Relative 3 %   Lymphs Abs 0.6 (L) 0.7 - 4.0 K/uL   Monocytes Relative 2 %   Monocytes Absolute 0.4 0.1 - 1.0 K/uL   Eosinophils Relative 0 %   Eosinophils Absolute 0.0 0.0 - 0.5 K/uL   Basophils Relative 0 %   Basophils Absolute 0.0 0.0 - 0.1 K/uL   Immature Granulocytes 1 %   Abs Immature Granulocytes 0.12 (H) 0.00 - 0.07 K/uL    Comment: Performed at [facility], [address], [place], [place] [address]  TSH     Status: None   Collection Time: 04/13/23  6:11 AM  Result Value Ref Range   TSH 2.652 0.350 - 4.500 uIU/mL    Comment: Performed by a 3rd Generation assay with a functional sensitivity of <=0.01 uIU/mL. Performed at [facility], [address] [place], [place] [address]   HIV Antibody (routine testing w rflx)     Status: None   Collection Time: 04/13/23  6:11 AM  Result Value Ref Range   HIV Screen 4th Generation wRfx Non Reactive Non Reactive    Comment: Performed at [facility], [address] [place], [place] [address]  Urinalysis, Routine w reflex microscopic -Urine, Clean Catch     Status: Abnormal   Collection Time: 04/13/23  6:50 AM  Result Value Ref Range   Color, Urine YELLOW YELLOW   APPearance HAZY (A) CLEAR    Specific Gravity, Urine 1.026 1.005 - 1.030   pH 5.0 5.0 - 8.0   Glucose, UA NEGATIVE NEGATIVE mg/dL   Hgb urine dipstick SMALL (A) NEGATIVE   Bilirubin Urine NEGATIVE NEGATIVE   Ketones, ur 20 (A) NEGATIVE mg/dL   Protein, ur 644 (A) NEGATIVE mg/dL   Nitrite NEGATIVE NEGATIVE   Leukocytes,Ua NEGATIVE NEGATIVE   RBC / HPF 11-20 0 -  5 RBC/hpf   WBC, UA 0-5 0 - 5 WBC/hpf   Bacteria, UA FEW (A) NONE SEEN   Squamous Epithelial / HPF 0-5 0 - 5 /HPF   Mucus PRESENT     Comment: Performed at [facility], [address], [place], [place] [address]  CBG monitoring, ED     Status: Abnormal   Collection Time: 04/13/23  8:53 AM  Result Value Ref Range   Glucose-Capillary 152 (H) 70 - 99 mg/dL    Comment: Glucose reference range applies only to samples taken after fasting for at least 8 hours.  CBG monitoring, ED     Status: Abnormal   Collection Time: 04/13/23 12:35 PM  Result Value Ref Range   Glucose-Capillary 150 (H) 70 - 99 mg/dL    Comment: Glucose reference range applies only to samples taken after fasting for at least 8 hours.   Comment 1 Notify RN    Comment 2 Document in Chart     DG Abd Portable 1 View Result Date: 04/13/2023 CLINICAL DATA:  NG tube placement. EXAM: PORTABLE ABDOMEN - 1 VIEW COMPARISON:  04/09/2019 FINDINGS: NG tube tip is in the gastric fundus with proximal side port below the GE junction. Diffuse gaseous small bowel distension is seen in the visualized upper abdomen with small bowel loops measuring up to 3.5 cm. Chronic interstitial changes noted in the visualized lung bases. IMPRESSION: 1. NG tube tip is in the gastric fundus. 2. Diffuse gaseous small bowel distension. Electronically Signed   By: Kennith Center M.D.   On: 04/13/2023 07:06   CT CHEST ABDOMEN PELVIS WO CONTRAST Result Date: 04/13/2023 CLINICAL DATA:  Shortness of breath, coughing and congestion, suspected small-bowel obstruction with abdominal pain. EXAM: CT CHEST, ABDOMEN AND PELVIS WITHOUT  CONTRAST TECHNIQUE: Multidetector CT imaging of the chest, abdomen and pelvis was performed following the standard protocol without IV contrast. RADIATION DOSE REDUCTION: This exam was performed according to the departmental dose-optimization program which includes automated exposure control, adjustment of the mA and/or kV according to patient size and/or use of iterative reconstruction technique. COMPARISON:  Portable chest yesterday, AP and lateral chest 03/16/2023, flat plate abdomen film 04/09/2019, and CT abdomen and pelvis without contrast 02/04/2018. FINDINGS: CT CHEST FINDINGS Cardiovascular: Cardiac size is normal. Left-sided coronary arteries are heavily calcified. Pulmonary arteries and veins are normal in caliber. Small pericardial effusion noted. There are calcifications in the aortic valve leaflets. Scattered aortic calcific plaques without aneurysm. There is minimal calcific plaque in the great vessels. Mediastinum/Nodes: The esophageal wall is diffusely thickened including over the GE junction. Small hiatal hernia. Endoscopy recommended. Thyroid gland is atrophic without mass.  Axillary spaces are clear. No enlarged intrathoracic lymph nodes are identified without contrast. A thoracic trachea in both main bronchi are unremarkable. Lungs/Pleura: Diffuse bronchial thickening noted greater in the lower lobes. Mild biapical pleural-parenchymal scarring calcifications. There is consolidation with air bronchograms in the right lower lobe, primarily in the basal segments and could be due to atelectasis, pneumonia or aspiration. This was not seen on the AP and lateral chest from 03/16/2023. There is additional tree-in-bud interstitial micronodularity in the superior segment of the right lower lobe in the superior segment of the left lower lobe, consistent with infectious or inflammatory bronchiolitis. There is additional band consolidation with associated scattered subsegmental bronchial plugging in the  medial basal left lower lobe. This could also be seen with aspiration. The lungs are otherwise generally clear. No pleural effusion is seen. Musculoskeletal: Osteopenia with  mild degenerative changes of thoracic spine. No acute or significant osseous findings. No chest wall mass. CT ABDOMEN PELVIS FINDINGS Hepatobiliary: Loss of fine detail due to streak artifacts from the patient's arms in the field. No obvious liver mass. No obvious gallbladder wall thickening or calcified stones. No biliary dilatation. Pancreas: Partially atrophic.  No focal abnormality. Spleen: No abnormality.  No splenomegaly. Adrenals/Urinary Tract: There is no adrenal mass. No contour deforming abnormality of the unenhanced kidneys. There are occasional punctate nonobstructive caliceal stones in both renal collecting systems. On the right, there is a 5 mm in length by 2 mm in diameter rod-like UPJ stone, with mild hydronephrosis upstream. Both ureters are otherwise clear. The bladder is unremarkable. There is impression on the bladder by the enlarged prostate. Stomach/Bowel: The stomach distended with air and fluid. There is diffuse small bowel dilatation except for small caliber segments in the right lower quadrant, maximum small bowel caliber 3.7 cm. A transitional segment was not found but the difference in caliber is more suggestive of obstruction than ileus. Etiology is undetermined. An appendix is not seen. There is diffuse colonic diverticulosis heaviest in the sigmoid, without diverticulitis or wall thickening. Vascular/Lymphatic: Aortic atherosclerosis. No enlarged abdominal or pelvic lymph nodes. Reproductive: Moderate to severe prostatomegaly, transverse axis 6 cm. Other: No abdominal wall hernia. Small left inguinal fat hernia. Small volume of perihepatic and pelvic ascites. No pneumatosis, free air or free hemorrhage. No localizing collections. Musculoskeletal: Degenerative change lumbar spine, mild dextrorotary scoliosis. Most  advanced degenerative change at L2-3 and L3-4. No acute or other significant osseous findings. IMPRESSION: 1. Diffuse small bowel dilatation except for small caliber segments in the right lower quadrant, maximum small bowel caliber 3.7 cm. A transitional segment was not found but the difference in caliber is more suggestive of a distal small bowel obstruction than ileus. Etiology is undetermined. 2. Small volume of perihepatic and pelvic ascites. No pneumatosis, free air or free hemorrhage. 3. Bilateral lower lobe consolidation with air bronchograms, right greater than left, which could be due to atelectasis, pneumonia or aspiration. 4. Additional tree-in-bud interstitial micronodularity in the superior segments of both lower lobes, consistent with infectious or inflammatory bronchiolitis. 5. Diffuse esophageal wall thickening including over the GE junction. Endoscopy recommended. Small hiatal hernia. 6. Aortic and coronary artery atherosclerosis. 7. 5 mm in length by 2 mm in diameter rod-like right UPJ stone with mild hydronephrosis. 8. Nonobstructive bilateral micronephrolithiasis. 9. Prostatomegaly, transverse axis 6 cm. 10. Colonic diverticulosis without evidence of diverticulitis. 11. Osteopenia and degenerative change. Aortic Atherosclerosis (ICD10-I70.0). Electronically Signed   By: Almira Bar M.D.   On: 04/13/2023 05:21   DG Chest Port 1 View Result Date: 04/12/2023 CLINICAL DATA:  Cough, shortness of breath EXAM: PORTABLE CHEST 1 VIEW COMPARISON:  03/16/2023 FINDINGS: Heart and mediastinal contours within normal limits. Airspace opacity in the right lower lobe. Left lung clear. No effusions or acute bony abnormality. IMPRESSION: Right lower lobe airspace opacity concerning for pneumonia. Electronically Signed   By: Charlett Nose M.D.   On: 04/12/2023 19:05    Imaging: Personally reviewed  A/P: JENESIS SUCHY is an 89 y.o. male with  [place] small bowel obstruction Right UPJ  stone Dementia COPD Hypothyroidism Community acquired pneumonia -positive influenza A Esophageal wall thickening   Believe wbc is from lungs and not abdomen. No wall thickening. No PV gas/pneumatosis.  No indication for urgent laparotomy.   Will start with our SBO protocol  Data reviewed: I reviewed ED notes, H&P, consultant  note, labs, plain x-rays, CT scan; cardiology office note January 3, ED note December 27; discussed with TRH and nurse.   Mary Sella. Andrey Campanile, MD, FACS General, Bariatric, & Minimally Invasive Surgery [facility]

## 2023-04-13 NOTE — ED Notes (Signed)
PT able to use urinal with assistance, strainer used and no kidney stones seen

## 2023-04-13 NOTE — ED Notes (Signed)
Tele sitter asked NT to put sock on the pt. Socks applied

## 2023-04-13 NOTE — ED Notes (Signed)
Pt vomit x3, admitting team contacted

## 2023-04-13 NOTE — H&P (Addendum)
History and Physical    Anthony Ball ZOX:[id number] DOB: 1934-03-23 DOA: 04/12/2023  Patient coming from: Home.  Chief Complaint: Shortness of breath.  HPI: Anthony Ball is a 89 y.o. male with history of COPD, dementia, hypothyroidism who was recently evaluated by cardiologist for chest pain and had stress test on 04/02/2013 which was unremarkable with normal LV was brought to the ER after patient was complaining of shortness of breath and productive cough.  Patient has dementia and is not able to give good history.  I was able to reach one of the sisters of the patient but they have requested to contact another sister by the name [name] who is not at this time reachable.  ED Course: In the ER patient was hypoxic requiring 2 L oxygen with chest x-ray showing concerning features for infiltrates WBC count was 19,000.  Patient was started on empiric antibiotics for pneumonia.  At the time of my exam patient started having multiple episodes of vomiting and abdomen appeared distended.  I ordered a stat CT abdomen pelvis CT chest.  CT abdomen pelvis shows features concerning for bowel obstruction and CT chest shows pneumonia.  Also shows features of right ureteric stone.  Review of Systems: As per HPI, rest all negative.   Past Medical History:  Diagnosis Date   COPD (chronic obstructive pulmonary disease) (HCC)    Emphysema    GERD (gastroesophageal reflux disease)    Seasonal allergies     Past Surgical History:  Procedure Laterality Date   CARDIAC CATHETERIZATION     PROSTATE SURGERY       reports that he has never smoked. He does not have any smokeless tobacco history on file. He reports that he does not drink alcohol and does not use drugs.  No Known Allergies  Family History  Family history unknown: Yes    Prior to Admission medications   Medication Sig Start Date End Date Taking? Authorizing Provider  albuterol (PROVENTIL HFA;VENTOLIN HFA) 108 (90 BASE) MCG/ACT inhaler  Inhale 2 puffs into the lungs every 4 (four) hours as needed for wheezing or shortness of breath. 04/13/12   Eber Hong, MD  levothyroxine (SYNTHROID) 25 MCG tablet Take 25 mcg by mouth daily before breakfast.    [provider]  meloxicam (MOBIC) 15 MG tablet Take 15 mg by mouth daily. 02/26/23   [provider]  memantine (NAMENDA) 10 MG tablet Take 10 mg by mouth daily.    [provider]  Omega-3 Fatty Acids (FISH OIL) 1000 MG CAPS Take 1 capsule by mouth daily.    [provider]  omeprazole (PRILOSEC) 20 MG capsule Take 20 mg by mouth 2 (two) times daily.     [provider]  Ethelda Chick Calcium 500 MG TABS Take 1 tablet by mouth daily.    [provider]  triamcinolone cream (KENALOG) 0.1 % Apply 1 application topically every other day. Apply to great areas of body    [provider]    Physical Exam: Constitutional: Moderately built and nourished. Vitals:   04/13/23 0330 04/13/23 0400 04/13/23 0518 04/13/23 0523  BP:   138/84   Pulse: 91 79 80   Resp: (!) 26 (!) 27 16   Temp:    98.1 F (36.7 C)  TempSrc:    Oral  SpO2: 96% 99% 96%   Weight:      Height:       Eyes: Anicteric no pallor. ENMT: No discharge from the ears  eyes nose or mouth. Neck: No mass felt.  No neck rigidity. Respiratory: No rhonchi or crepitations. Cardiovascular: S1-S2 heard. Abdomen: Distended nontender bowel sounds not appreciated. Musculoskeletal: No edema. Skin: No rash. Neurologic: Alert awake oriented to his name moving all extremities. Psychiatric: Oriented to his name.   Labs on Admission: I have personally reviewed following labs and imaging studies  CBC: Recent Labs  Lab 04/12/23 1850 04/12/23 1900  WBC 19.4*  --   NEUTROABS 16.0*  --   HGB 16.0 16.7  HCT 47.8 49.0  MCV 87.1  --   PLT 322  --    Basic Metabolic Panel: Recent Labs  Lab 04/12/23 1850 04/12/23 1900  NA 136 135  K 3.8 3.7  CL 99  --   CO2 24  --    GLUCOSE 125*  --   BUN 30*  --   CREATININE 1.12  --   CALCIUM 9.1  --    GFR: Estimated Creatinine Clearance: 40.8 mL/min (by C-G formula based on SCr of 1.12 mg/dL). Liver Function Tests: Recent Labs  Lab 04/12/23 1850  AST 28  ALT 14  ALKPHOS 71  BILITOT 0.9  PROT 6.6  ALBUMIN 3.2*   No results for input(s): "LIPASE", "AMYLASE" in the last 168 hours. No results for input(s): "AMMONIA" in the last 168 hours. Coagulation Profile: No results for input(s): "INR", "PROTIME" in the last 168 hours. Cardiac Enzymes: No results for input(s): "CKTOTAL", "CKMB", "CKMBINDEX", "TROPONINI" in the last 168 hours. BNP (last 3 results) No results for input(s): "PROBNP" in the last 8760 hours. HbA1C: No results for input(s): "HGBA1C" in the last 72 hours. CBG: No results for input(s): "GLUCAP" in the last 168 hours. Lipid Profile: No results for input(s): "CHOL", "HDL", "LDLCALC", "TRIG", "CHOLHDL", "LDLDIRECT" in the last 72 hours. Thyroid Function Tests: No results for input(s): "TSH", "T4TOTAL", "FREET4", "T3FREE", "THYROIDAB" in the last 72 hours. Anemia Panel: No results for input(s): "VITAMINB12", "FOLATE", "FERRITIN", "TIBC", "IRON", "RETICCTPCT" in the last 72 hours. Urine analysis:    Component Value Date/Time   COLORURINE YELLOW 11/05/2022 0540   APPEARANCEUR CLEAR 11/05/2022 0540   LABSPEC 1.010 11/05/2022 0540   PHURINE 5.0 11/05/2022 0540   GLUCOSEU NEGATIVE 11/05/2022 0540   HGBUR NEGATIVE 11/05/2022 0540   BILIRUBINUR NEGATIVE 11/05/2022 0540   KETONESUR NEGATIVE 11/05/2022 0540   PROTEINUR NEGATIVE 11/05/2022 0540   NITRITE NEGATIVE 11/05/2022 0540   LEUKOCYTESUR NEGATIVE 11/05/2022 0540   Sepsis Labs: @LABRCNTIP (procalcitonin:4,lacticidven:4) ) Recent Results (from the past 240 hours)  Resp panel by RT-PCR (RSV, Flu A&B, Covid) Anterior Nasal Swab     Status: Abnormal   Collection Time: 04/12/23  6:57 PM   Specimen: Anterior Nasal Swab  Result Value Ref  Range Status   SARS Coronavirus 2 by RT PCR NEGATIVE NEGATIVE Final   Influenza A by PCR POSITIVE (A) NEGATIVE Final   Influenza B by PCR NEGATIVE NEGATIVE Final    Comment: (NOTE) The Xpert Xpress SARS-CoV-2/FLU/RSV plus assay is intended as an aid in the diagnosis of influenza from Nasopharyngeal swab specimens and should not be used as a sole basis for treatment. Nasal washings and aspirates are unacceptable for Xpert Xpress SARS-CoV-2/FLU/RSV testing.  Fact Sheet for Patients: BloggerCourse.com  Fact Sheet for Healthcare Providers: SeriousBroker.it  This test is not yet approved or cleared by the [place] FDA and has been authorized for detection and/or diagnosis of SARS-CoV-2 by FDA under an Emergency Use Authorization (EUA). This EUA will remain in effect (meaning this  test can be used) for the duration of the COVID-19 declaration under Section 564(b)(1) of the Act, 21 U.S.C. section 360bbb-3(b)(1), unless the authorization is terminated or revoked.     Resp Syncytial Virus by PCR NEGATIVE NEGATIVE Final    Comment: (NOTE) Fact Sheet for Patients: BloggerCourse.com  Fact Sheet for Healthcare Providers: SeriousBroker.it  This test is not yet approved or cleared by the [place] FDA and has been authorized for detection and/or diagnosis of SARS-CoV-2 by FDA under an Emergency Use Authorization (EUA). This EUA will remain in effect (meaning this test can be used) for the duration of the COVID-19 declaration under Section 564(b)(1) of the Act, 21 U.S.C. section 360bbb-3(b)(1), unless the authorization is terminated or revoked.  Performed at [facility], [address], [place], [place] [address]      Radiological Exams on Admission: CT CHEST ABDOMEN PELVIS WO CONTRAST Result Date: 04/13/2023 CLINICAL DATA:  Shortness of breath, coughing and congestion,  suspected small-bowel obstruction with abdominal pain. EXAM: CT CHEST, ABDOMEN AND PELVIS WITHOUT CONTRAST TECHNIQUE: Multidetector CT imaging of the chest, abdomen and pelvis was performed following the standard protocol without IV contrast. RADIATION DOSE REDUCTION: This exam was performed according to the departmental dose-optimization program which includes automated exposure control, adjustment of the mA and/or kV according to patient size and/or use of iterative reconstruction technique. COMPARISON:  Portable chest yesterday, AP and lateral chest 03/16/2023, flat plate abdomen film 04/09/2019, and CT abdomen and pelvis without contrast 02/04/2018. FINDINGS: CT CHEST FINDINGS Cardiovascular: Cardiac size is normal. Left-sided coronary arteries are heavily calcified. Pulmonary arteries and veins are normal in caliber. Small pericardial effusion noted. There are calcifications in the aortic valve leaflets. Scattered aortic calcific plaques without aneurysm. There is minimal calcific plaque in the great vessels. Mediastinum/Nodes: The esophageal wall is diffusely thickened including over the GE junction. Small hiatal hernia. Endoscopy recommended. Thyroid gland is atrophic without mass.  Axillary spaces are clear. No enlarged intrathoracic lymph nodes are identified without contrast. A thoracic trachea in both main bronchi are unremarkable. Lungs/Pleura: Diffuse bronchial thickening noted greater in the lower lobes. Mild biapical pleural-parenchymal scarring calcifications. There is consolidation with air bronchograms in the right lower lobe, primarily in the basal segments and could be due to atelectasis, pneumonia or aspiration. This was not seen on the AP and lateral chest from 03/16/2023. There is additional tree-in-bud interstitial micronodularity in the superior segment of the right lower lobe in the superior segment of the left lower lobe, consistent with infectious or inflammatory bronchiolitis. There is  additional band consolidation with associated scattered subsegmental bronchial plugging in the medial basal left lower lobe. This could also be seen with aspiration. The lungs are otherwise generally clear. No pleural effusion is seen. Musculoskeletal: Osteopenia with mild degenerative changes of thoracic spine. No acute or significant osseous findings. No chest wall mass. CT ABDOMEN PELVIS FINDINGS Hepatobiliary: Loss of fine detail due to streak artifacts from the patient's arms in the field. No obvious liver mass. No obvious gallbladder wall thickening or calcified stones. No biliary dilatation. Pancreas: Partially atrophic.  No focal abnormality. Spleen: No abnormality.  No splenomegaly. Adrenals/Urinary Tract: There is no adrenal mass. No contour deforming abnormality of the unenhanced kidneys. There are occasional punctate nonobstructive caliceal stones in both renal collecting systems. On the right, there is a 5 mm in length by 2 mm in diameter rod-like UPJ stone, with mild hydronephrosis upstream. Both ureters are otherwise clear. The bladder is unremarkable. There is impression  on the bladder by the enlarged prostate. Stomach/Bowel: The stomach distended with air and fluid. There is diffuse small bowel dilatation except for small caliber segments in the right lower quadrant, maximum small bowel caliber 3.7 cm. A transitional segment was not found but the difference in caliber is more suggestive of obstruction than ileus. Etiology is undetermined. An appendix is not seen. There is diffuse colonic diverticulosis heaviest in the sigmoid, without diverticulitis or wall thickening. Vascular/Lymphatic: Aortic atherosclerosis. No enlarged abdominal or pelvic lymph nodes. Reproductive: Moderate to severe prostatomegaly, transverse axis 6 cm. Other: No abdominal wall hernia. Small left inguinal fat hernia. Small volume of perihepatic and pelvic ascites. No pneumatosis, free air or free hemorrhage. No localizing  collections. Musculoskeletal: Degenerative change lumbar spine, mild dextrorotary scoliosis. Most advanced degenerative change at L2-3 and L3-4. No acute or other significant osseous findings. IMPRESSION: 1. Diffuse small bowel dilatation except for small caliber segments in the right lower quadrant, maximum small bowel caliber 3.7 cm. A transitional segment was not found but the difference in caliber is more suggestive of a distal small bowel obstruction than ileus. Etiology is undetermined. 2. Small volume of perihepatic and pelvic ascites. No pneumatosis, free air or free hemorrhage. 3. Bilateral lower lobe consolidation with air bronchograms, right greater than left, which could be due to atelectasis, pneumonia or aspiration. 4. Additional tree-in-bud interstitial micronodularity in the superior segments of both lower lobes, consistent with infectious or inflammatory bronchiolitis. 5. Diffuse esophageal wall thickening including over the GE junction. Endoscopy recommended. Small hiatal hernia. 6. Aortic and coronary artery atherosclerosis. 7. 5 mm in length by 2 mm in diameter rod-like right UPJ stone with mild hydronephrosis. 8. Nonobstructive bilateral micronephrolithiasis. 9. Prostatomegaly, transverse axis 6 cm. 10. Colonic diverticulosis without evidence of diverticulitis. 11. Osteopenia and degenerative change. Aortic Atherosclerosis (ICD10-I70.0). Electronically Signed   By: Almira Bar M.D.   On: 04/13/2023 05:21   DG Chest Port 1 View Result Date: 04/12/2023 CLINICAL DATA:  Cough, shortness of breath EXAM: PORTABLE CHEST 1 VIEW COMPARISON:  03/16/2023 FINDINGS: Heart and mediastinal contours within normal limits. Airspace opacity in the right lower lobe. Left lung clear. No effusions or acute bony abnormality. IMPRESSION: Right lower lobe airspace opacity concerning for pneumonia. Electronically Signed   By: Charlett Nose M.D.   On: 04/12/2023 19:05     Assessment/Plan Principal Problem:    CAP (community acquired pneumonia) Active Problems:   SBO (small bowel obstruction) (HCC)   Ureteral stone   COPD (chronic obstructive pulmonary disease) (HCC)   Hypothyroidism   Dementia (HCC)   GERD (gastroesophageal reflux disease)    Small bowel obstruction -     will keep patient NPO.  NG tube to low intermittent suction.  Consulted general surgery Dr. Violeta Gelinas for further recommendations. Pneumonia with influenza A positive on empiric antibiotics community-acquired pneumonia. Right UPJ stone with hydronephrosis.  Discussed with on-call urologist Dr. Shirlean Mylar.  Who advised to get UA and if infected to call them back.  UA is pending. COPD not actively wheezing. Diffuse esophageal thickening seen on the CAT scan will eventually need EGD. Dementia takes Namenda at home.  Presently NPO. Hypothyroidism takes Synthroid. Recent stress test was negative for ischemia.  Patient is a poor historian.  And unable to reach patient's Sister [name] who lives across patient's house.  Will need to get further history.   With patient having bowel obstruction pneumonia and right-sided UPJ stone will need close monitoring and more than 2 midnight stay  and inpatient status.   DVT prophylaxis: SCDs. Code Status: Full code. Family Communication: Talked to patient's Sister Johnanna Schneiders family but they have requested to call Ms. Bonita Quin, I was unable to reach. Disposition Plan: Monitored bed. Consults called: General Surgery has been consulted.  Discussed with urology. Admission status: Inpatient.

## 2023-04-13 NOTE — ED Notes (Addendum)
PT dislodged NG tube, replaced at 49 at the nare, XR to confirm placement, mittens applied to PT

## 2023-04-13 NOTE — Consult Note (Signed)
Urology Consult Note   Requesting Attending Physician:  Joycelyn Das, MD Service Providing Consult: Urology  Consulting Attending: Dr. Ronne Binning   Reason for Consult:    HPI: Anthony Ball is seen in consultation for reasons noted above at the request of Pokhrel, Rebekah [place], MD. Patient is an 89 year old male with PMH significant for COPD, dementia, hypothyroid, prostatomegaly, and hydrocele.  He presents to [facility] emergency department with complaint of shortness of breath and productive cough.  He is known to our practice, having been seen by NP Anthony Ball for right hydrocele measuring 3-5 cm, frequent urination and some nocturia.  He did not show for his follow-up appointment.  ED workup revealed significant leukocytosis of 22.5K, labs were otherwise unremarkable with clear urinalysis and preserved renal function.  He was Flu A positive.  CT C/A/P noted RLL consolidation with air bronchograms and incidental finding of 5 x 2 mm rod-like right UPJ stone with mild hydronephrosis.  He has a large prostate with a volume between 70 and 90 g, depending on calculation tool.  When meeting with Anthony Ball he was exceedingly confused and really could not answer any questions accurately.  His consistent complaint is regarding his epigastric pain. ------------------  Assessment:  89 y.o. male with significant confusion/dementia presents for shortness of breath in the context of active flu, small bowel obstruction, and incidental finding of right UPJ stone.   Recommendations: #Right UPJ stone Patient has a clear urinalysis and preserved renal function.  He is not localizing any of his pain to his flank and the stone appears to have been an incidental finding.  Considering his small bowel obstruction and flu/pneumonia, I would recommend watchful waiting at this time. Will start Flomax and have nursing strain all urine.  Hopefully he will pass while in hospital.  # BPH He has a significant prostatomegaly  and his bladder is quite full on CT.  However his renal function is preserved and he effectively had no postvoid residuals in clinic less than a year ago.  Would not catheterize at this time unless clinically indicated for something else. I would rather be able to strain his urine.  The Flomax will obviously help with bladder outlet obstruction as well.  Case and plan discussed with Dr. Ronne Binning  Past Medical History: Past Medical History:  Diagnosis Date   COPD (chronic obstructive pulmonary disease) (HCC)    Emphysema    GERD (gastroesophageal reflux disease)    Seasonal allergies     Past Surgical History:  Past Surgical History:  Procedure Laterality Date   CARDIAC CATHETERIZATION     PROSTATE SURGERY      Medication: Current Facility-Administered Medications  Medication Dose Route Frequency Provider Last Rate Last Admin   acetaminophen (TYLENOL) tablet 650 mg  650 mg Oral Q6H PRN Eduard Clos, MD       Or   acetaminophen (TYLENOL) suppository 650 mg  650 mg Rectal Q6H PRN Eduard Clos, MD       [START ON 04/14/2023] azithromycin (ZITHROMAX) 500 mg in sodium chloride 0.9 % 250 mL IVPB  500 mg Intravenous Q24H Eduard Clos, MD       cefTRIAXone (ROCEPHIN) 2 g in sodium chloride 0.9 % 100 mL IVPB  2 g Intravenous Q24H Eduard Clos, MD       dextrose 5 % in lactated ringers infusion   Intravenous Continuous Eduard Clos, MD 75 mL/hr at 04/13/23 0622 New Bag at 04/13/23 0622   ipratropium-albuterol (DUONEB)  0.5-2.5 (3) MG/3ML nebulizer solution 3 mL  3 mL Nebulization Q2H PRN Eduard Clos, MD       ondansetron [facility]) tablet 4 mg  4 mg Oral Q6H PRN Eduard Clos, MD       Or   ondansetron [facility]) injection 4 mg  4 mg Intravenous Q6H PRN Eduard Clos, MD       Current Outpatient Medications  Medication Sig Dispense Refill   albuterol (PROVENTIL HFA;VENTOLIN HFA) 108 (90 BASE) MCG/ACT inhaler Inhale 2 puffs into the  lungs every 4 (four) hours as needed for wheezing or shortness of breath. 1 Inhaler 3   levothyroxine (SYNTHROID) 25 MCG tablet Take 25 mcg by mouth daily before breakfast.     meloxicam (MOBIC) 15 MG tablet Take 15 mg by mouth daily.     memantine (NAMENDA) 10 MG tablet Take 10 mg by mouth daily.     Omega-3 Fatty Acids (FISH OIL) 1000 MG CAPS Take 1 capsule by mouth daily.     omeprazole (PRILOSEC) 20 MG capsule Take 20 mg by mouth 2 (two) times daily.      Oyster Shell Calcium 500 MG TABS Take 1 tablet by mouth daily.     triamcinolone cream (KENALOG) 0.1 % Apply 1 application topically every other day. Apply to great areas of body      Allergies: No Known Allergies  Social History: Social History   Tobacco Use   Smoking status: Never  Substance Use Topics   Alcohol use: No   Drug use: No    Family History Family History  Family history unknown: Yes    Review of Systems  Unable to perform ROS: Dementia     Objective   Vital signs in last 24 hours: BP 138/84   Pulse 80   Temp 98.1 F (36.7 C) (Oral)   Resp 16   Ht 5\' 6"  (1.676 m)   Wt 61 kg   SpO2 93%   BMI 21.71 kg/m   Physical Exam General: A&O, resting, appropriate HEENT: [place]/AT Pulmonary: Normal work of breathing Cardiovascular: no cyanosis Abdomen: Soft, NTTP, nondistended GU: right hydrocele Neuro: Appropriate, no focal neurological deficits  Most Recent Labs: Lab Results  Component Value Date   WBC 22.5 (H) 04/13/2023   HGB 15.0 04/13/2023   HCT 44.3 04/13/2023   PLT 320 04/13/2023    Lab Results  Component Value Date   NA 138 04/13/2023   K 3.7 04/13/2023   CL 99 04/13/2023   CO2 22 04/13/2023   BUN 29 (H) 04/13/2023   CREATININE 1.01 04/13/2023   CALCIUM 9.3 04/13/2023    No results found for: "INR", "APTT"   Urine Culture: @[id number] (laburin,org,[id number])@   IMAGING: DG Abd Portable 1 View Result Date: 04/13/2023 CLINICAL DATA:  NG tube placement. EXAM: PORTABLE ABDOMEN  - 1 VIEW COMPARISON:  04/09/2019 FINDINGS: NG tube tip is in the gastric fundus with proximal side port below the GE junction. Diffuse gaseous small bowel distension is seen in the visualized upper abdomen with small bowel loops measuring up to 3.5 cm. Chronic interstitial changes noted in the visualized lung bases. IMPRESSION: 1. NG tube tip is in the gastric fundus. 2. Diffuse gaseous small bowel distension. Electronically Signed   By: Kennith Center M.D.   On: 04/13/2023 07:06   CT CHEST ABDOMEN PELVIS WO CONTRAST Result Date: 04/13/2023 CLINICAL DATA:  Shortness of breath, coughing and congestion, suspected small-bowel obstruction with abdominal pain. EXAM: CT CHEST, ABDOMEN AND PELVIS WITHOUT  CONTRAST TECHNIQUE: Multidetector CT imaging of the chest, abdomen and pelvis was performed following the standard protocol without IV contrast. RADIATION DOSE REDUCTION: This exam was performed according to the departmental dose-optimization program which includes automated exposure control, adjustment of the mA and/or kV according to patient size and/or use of iterative reconstruction technique. COMPARISON:  Portable chest yesterday, AP and lateral chest 03/16/2023, flat plate abdomen film 04/09/2019, and CT abdomen and pelvis without contrast 02/04/2018. FINDINGS: CT CHEST FINDINGS Cardiovascular: Cardiac size is normal. Left-sided coronary arteries are heavily calcified. Pulmonary arteries and veins are normal in caliber. Small pericardial effusion noted. There are calcifications in the aortic valve leaflets. Scattered aortic calcific plaques without aneurysm. There is minimal calcific plaque in the great vessels. Mediastinum/Nodes: The esophageal wall is diffusely thickened including over the GE junction. Small hiatal hernia. Endoscopy recommended. Thyroid gland is atrophic without mass.  Axillary spaces are clear. No enlarged intrathoracic lymph nodes are identified without contrast. A thoracic trachea in both main  bronchi are unremarkable. Lungs/Pleura: Diffuse bronchial thickening noted greater in the lower lobes. Mild biapical pleural-parenchymal scarring calcifications. There is consolidation with air bronchograms in the right lower lobe, primarily in the basal segments and could be due to atelectasis, pneumonia or aspiration. This was not seen on the AP and lateral chest from 03/16/2023. There is additional tree-in-bud interstitial micronodularity in the superior segment of the right lower lobe in the superior segment of the left lower lobe, consistent with infectious or inflammatory bronchiolitis. There is additional band consolidation with associated scattered subsegmental bronchial plugging in the medial basal left lower lobe. This could also be seen with aspiration. The lungs are otherwise generally clear. No pleural effusion is seen. Musculoskeletal: Osteopenia with mild degenerative changes of thoracic spine. No acute or significant osseous findings. No chest wall mass. CT ABDOMEN PELVIS FINDINGS Hepatobiliary: Loss of fine detail due to streak artifacts from the patient's arms in the field. No obvious liver mass. No obvious gallbladder wall thickening or calcified stones. No biliary dilatation. Pancreas: Partially atrophic.  No focal abnormality. Spleen: No abnormality.  No splenomegaly. Adrenals/Urinary Tract: There is no adrenal mass. No contour deforming abnormality of the unenhanced kidneys. There are occasional punctate nonobstructive caliceal stones in both renal collecting systems. On the right, there is a 5 mm in length by 2 mm in diameter rod-like UPJ stone, with mild hydronephrosis upstream. Both ureters are otherwise clear. The bladder is unremarkable. There is impression on the bladder by the enlarged prostate. Stomach/Bowel: The stomach distended with air and fluid. There is diffuse small bowel dilatation except for small caliber segments in the right lower quadrant, maximum small bowel caliber 3.7 cm.  A transitional segment was not found but the difference in caliber is more suggestive of obstruction than ileus. Etiology is undetermined. An appendix is not seen. There is diffuse colonic diverticulosis heaviest in the sigmoid, without diverticulitis or wall thickening. Vascular/Lymphatic: Aortic atherosclerosis. No enlarged abdominal or pelvic lymph nodes. Reproductive: Moderate to severe prostatomegaly, transverse axis 6 cm. Other: No abdominal wall hernia. Small left inguinal fat hernia. Small volume of perihepatic and pelvic ascites. No pneumatosis, free air or free hemorrhage. No localizing collections. Musculoskeletal: Degenerative change lumbar spine, mild dextrorotary scoliosis. Most advanced degenerative change at L2-3 and L3-4. No acute or other significant osseous findings. IMPRESSION: 1. Diffuse small bowel dilatation except for small caliber segments in the right lower quadrant, maximum small bowel caliber 3.7 cm. A transitional segment was not found but the difference in caliber  is more suggestive of a distal small bowel obstruction than ileus. Etiology is undetermined. 2. Small volume of perihepatic and pelvic ascites. No pneumatosis, free air or free hemorrhage. 3. Bilateral lower lobe consolidation with air bronchograms, right greater than left, which could be due to atelectasis, pneumonia or aspiration. 4. Additional tree-in-bud interstitial micronodularity in the superior segments of both lower lobes, consistent with infectious or inflammatory bronchiolitis. 5. Diffuse esophageal wall thickening including over the GE junction. Endoscopy recommended. Small hiatal hernia. 6. Aortic and coronary artery atherosclerosis. 7. 5 mm in length by 2 mm in diameter rod-like right UPJ stone with mild hydronephrosis. 8. Nonobstructive bilateral micronephrolithiasis. 9. Prostatomegaly, transverse axis 6 cm. 10. Colonic diverticulosis without evidence of diverticulitis. 11. Osteopenia and degenerative change.  Aortic Atherosclerosis (ICD10-I70.0). Electronically Signed   By: Almira Bar M.D.   On: 04/13/2023 05:21   DG Chest Port 1 View Result Date: 04/12/2023 CLINICAL DATA:  Cough, shortness of breath EXAM: PORTABLE CHEST 1 VIEW COMPARISON:  03/16/2023 FINDINGS: Heart and mediastinal contours within normal limits. Airspace opacity in the right lower lobe. Left lung clear. No effusions or acute bony abnormality. IMPRESSION: Right lower lobe airspace opacity concerning for pneumonia. Electronically Signed   By: Charlett Nose M.D.   On: 04/12/2023 19:05    ------  Elmon Kirschner, NP Pager: [phone]   Please contact the urology consult pager with any further questions/concerns.

## 2023-04-13 NOTE — ED Notes (Signed)
PT NG tube advanced per radiographic report, Xray confirmation reordered, 73 at the nare

## 2023-04-14 ENCOUNTER — Inpatient Hospital Stay (HOSPITAL_COMMUNITY): Payer: Medicare Other

## 2023-04-14 DIAGNOSIS — J189 Pneumonia, unspecified organism: Secondary | ICD-10-CM | POA: Diagnosis not present

## 2023-04-14 LAB — GLUCOSE, CAPILLARY
Glucose-Capillary: 125 mg/dL — ABNORMAL HIGH (ref 70–99)
Glucose-Capillary: 103 mg/dL — ABNORMAL HIGH (ref 70–99)
Glucose-Capillary: 105 mg/dL — ABNORMAL HIGH (ref 70–99)
Glucose-Capillary: 102 mg/dL — ABNORMAL HIGH (ref 70–99)

## 2023-04-14 LAB — PHOSPHORUS: Phosphorus: 2.3 mg/dL — ABNORMAL LOW (ref 2.5–4.6)

## 2023-04-14 LAB — URINE CULTURE: Culture: NO GROWTH

## 2023-04-14 LAB — CBC
HCT: 40.8 % (ref 39.0–52.0)
Hemoglobin: 13.4 g/dL (ref 13.0–17.0)
MCH: 28.6 pg (ref 26.0–34.0)
MCHC: 32.8 g/dL (ref 30.0–36.0)
MCV: 87.2 fL (ref 80.0–100.0)
Platelets: 332 10*3/uL (ref 150–400)
RBC: 4.68 MIL/uL (ref 4.22–5.81)
RDW: 14 % (ref 11.5–15.5)
WBC: 18.4 10*3/uL — ABNORMAL HIGH (ref 4.0–10.5)
nRBC: 0 % (ref 0.0–0.2)

## 2023-04-14 LAB — MAGNESIUM: Magnesium: 2.3 mg/dL (ref 1.7–2.4)

## 2023-04-14 LAB — BASIC METABOLIC PANEL
Anion gap: 13 (ref 5–15)
BUN: 29 mg/dL — ABNORMAL HIGH (ref 8–23)
CO2: 26 mmol/L (ref 22–32)
Calcium: 9.1 mg/dL (ref 8.9–10.3)
Chloride: 105 mmol/L (ref 98–111)
Creatinine, Ser: 1.15 mg/dL (ref 0.61–1.24)
GFR, Estimated: >60 mL/min (ref >60)
Glucose, Bld: 109 mg/dL — ABNORMAL HIGH (ref 70–99)
Potassium: 3.3 mmol/L — ABNORMAL LOW (ref 3.5–5.1)
Sodium: 144 mmol/L (ref 135–145)

## 2023-04-14 MED ORDER — ZIPRASIDONE MESYLATE 20 MG IM SOLR
20.0000 mg | Freq: Once | INTRAMUSCULAR | Status: AC | PRN
  Administered 2023-04-14: 20 mg via INTRAMUSCULAR
  Filled 2023-04-14: qty 20

## 2023-04-14 MED ORDER — DIATRIZOATE MEGLUMINE & SODIUM 66-10 % PO SOLN
90.0000 mL | Freq: Once | ORAL | Status: DC
  Filled 2023-04-14: qty 90

## 2023-04-14 MED ORDER — HALOPERIDOL LACTATE 5 MG/ML IJ SOLN
4.0000 mg | Freq: Once | INTRAMUSCULAR | Status: AC
  Administered 2023-04-14: 4 mg via INTRAVENOUS
  Filled 2023-04-14: qty 1

## 2023-04-14 MED ORDER — DEXTROSE IN LACTATED RINGERS 5 % IV SOLN
INTRAVENOUS | Status: DC
Start: 2023-04-14 — End: 2023-04-15

## 2023-04-14 MED ORDER — DOXAZOSIN MESYLATE 1 MG PO TABS
1.0000 mg | ORAL_TABLET | Freq: Every day | ORAL | Status: DC
  Administered 2023-04-15 – 2023-05-14 (×30): 1 mg via ORAL
  Filled 2023-04-14 (×31): qty 1

## 2023-04-14 NOTE — Plan of Care (Signed)
  Problem: Education: Goal: Knowledge of General Education information will improve Description: Including pain rating scale, medication(s)/side effects and non-pharmacologic comfort measures Outcome: Progressing   Problem: Education: Goal: Knowledge of General Education information will improve Description: Including pain rating scale, medication(s)/side effects and non-pharmacologic comfort measures Outcome: Progressing   Problem: Health Behavior/Discharge Planning: Goal: Ability to manage health-related needs will improve Outcome: Progressing   Problem: Clinical Measurements: Goal: Ability to maintain clinical measurements within normal limits will improve Outcome: Progressing   Problem: Nutrition: Goal: Adequate nutrition will be maintained Outcome: Progressing   Problem: Coping: Goal: Level of anxiety will decrease Outcome: Progressing   Problem: Coping: Goal: Level of anxiety will decrease Outcome: Progressing   Problem: Safety: Goal: Ability to remain free from injury will improve Outcome: Progressing

## 2023-04-14 NOTE — Progress Notes (Signed)
TR[facility] night cross cover note:   I was notified by RN that this patient, in spite of soft bilateral wrist restraints, has removed his NGT. This may need to be replaced in the AM when patient's sundowning is not so pronounced.     Newton Pigg, DO Hospitalist

## 2023-04-14 NOTE — Progress Notes (Signed)
Pt pulled NG tube out. Contacted OC, advised to hold off on re insertion of NG Tube for day rounding.

## 2023-04-14 NOTE — Progress Notes (Signed)
Patient continuously trying to climb out of bed. Pt agitated, contacted OC for possible PRN med.

## 2023-04-14 NOTE — Progress Notes (Signed)
RN spoke with sisters of patient at the bedside Bonita Quin and [place]. Eber Jones stated she is the patients healthcare POA however she can not find the paperwork. Chaplain consult placed for help with advanced directives. Phone numbers for both Bonita Quin and Eber Jones are updated in the chart. Eber Jones states they will both be back at the bedside tomorrow around 2pm.

## 2023-04-14 NOTE — Progress Notes (Addendum)
Soft wrist restraints removed from patient at 0800 due to safety 1:1 sitter available and at bedside. No signs of redness/skin breakdown on wrists. Patient A&O to self only. Care continues. MD Pokhrel notified.

## 2023-04-14 NOTE — Progress Notes (Signed)
TR[facility] night cross cover note:   I was notified by RN that the patient continues to be agitated, continues to attempt to get out of bed, these behaviors refractory to attempts at verbal redirection and refractory to dose of prn IV Haldol administered earlier this evening.  In the setting of associated interference with ongoing medical treatment posing potential harm to themself, I ordered Geodon 20 mg IM x 1 dose prn for agitation.    Newton Pigg, DO Hospitalist

## 2023-04-14 NOTE — Progress Notes (Signed)
Subjective/Chief Complaint: Patient quite agitated, confused overnight Removed his NG tube No nausea or vomiting No recorded bowel movements No plain films today.  It does not appear that the gastrografin was given as ordered yesterday.    Objective: Vital signs in last 24 hours: Temp:  [97.6 F (36.4 C)-98.6 F (37 C)] 98.5 F (36.9 C) (01/25 0459) Pulse Rate:  [75-112] 112 (01/25 0522) Resp:  [16-26] 17 (01/25 0459) BP: (132-150)/(74-97) 150/80 (01/25 0459) SpO2:  [87 %-97 %] 91 % (01/25 0522) Weight:  [56.2 kg] 56.2 kg (01/24 1725)    Intake/Output from previous day: 01/24 0701 - 01/25 0700 In: 1016.6 [I.V.:800.4; IV Piggyback:216.2] Out: 150 [Urine:150] Intake/Output this shift: Total I/O In: -  Out: 200 [Urine:200]  Elderly male Confused, mumbling constantly Indicates no abdominal pain Abd - soft, minimal distention, non-tender Soft wrist restraints  Lab Results:  Recent Labs    04/12/23 1850 04/12/23 1900 04/13/23 0611  WBC 19.4*  --  22.5*  HGB 16.0 16.7 15.0  HCT 47.8 49.0 44.3  PLT 322  --  320   BMET Recent Labs    04/12/23 1850 04/12/23 1900 04/13/23 0611  NA 136 135 138  K 3.8 3.7 3.7  CL 99  --  99  CO2 24  --  22  GLUCOSE 125*  --  164*  BUN 30*  --  29*  CREATININE 1.12  --  1.01  CALCIUM 9.1  --  9.3   PT/INR No results for input(s): "LABPROT", "INR" in the last 72 hours. ABG Recent Labs    04/12/23 1900  HCO3 26.6    Studies/Results: DG Abd 1 View Result Date: 04/13/2023 CLINICAL DATA:  NG tube EXAM: ABDOMEN - 1 VIEW COMPARISON:  Chest x-ray 04/13/2023 FINDINGS: Enteric tube tip is at the level of the gastroesophageal junction. Dilated small bowel loops are again seen. There are atelectatic changes in the right lung base. IMPRESSION: Enteric tube tip is at the level of the gastroesophageal junction. Recommend advancement 10 cm. Electronically Signed   By: Darliss Cheney M.D.   On: 04/13/2023 20:13   DG Abd Portable 1  View Result Date: 04/13/2023 CLINICAL DATA:  NG tube placement. EXAM: PORTABLE ABDOMEN - 1 VIEW COMPARISON:  Radiograph earlier today FINDINGS: Tip and side port of the enteric tube below the diaphragm in the stomach. Gaseous bowel distention in the upper abdomen again seen. Patchy right lung base opacity. IMPRESSION: Tip and side port of the enteric tube below the diaphragm in the stomach. Electronically Signed   By: Narda Rutherford M.D.   On: 04/13/2023 16:47   DG Abd Portable 1 View Result Date: 04/13/2023 CLINICAL DATA:  NG tube placement EXAM: PORTABLE ABDOMEN - 1 VIEW COMPARISON:  04/13/2023 FINDINGS: Enteric tube tip is retracted, side-port at the level of the distal esophagus, tip in the region of GE junction. Dilated small bowel in the upper abdomen. Atelectasis or infiltrates at the bases. IMPRESSION: Enteric tube tip is retracted, side-port at the level of the distal esophagus, tip in the region of GE junction. Recommend advancement by around 10 cm for more optimal positioning Electronically Signed   By: Jasmine Pang M.D.   On: 04/13/2023 15:16   DG Abd Portable 1 View Result Date: 04/13/2023 CLINICAL DATA:  NG tube placement. EXAM: PORTABLE ABDOMEN - 1 VIEW COMPARISON:  04/09/2019 FINDINGS: NG tube tip is in the gastric fundus with proximal side port below the GE junction. Diffuse gaseous small bowel distension is seen  in the visualized upper abdomen with small bowel loops measuring up to 3.5 cm. Chronic interstitial changes noted in the visualized lung bases. IMPRESSION: 1. NG tube tip is in the gastric fundus. 2. Diffuse gaseous small bowel distension. Electronically Signed   By: Kennith Center M.D.   On: 04/13/2023 07:06   CT CHEST ABDOMEN PELVIS WO CONTRAST Result Date: 04/13/2023 CLINICAL DATA:  Shortness of breath, coughing and congestion, suspected small-bowel obstruction with abdominal pain. EXAM: CT CHEST, ABDOMEN AND PELVIS WITHOUT CONTRAST TECHNIQUE: Multidetector CT imaging of the  chest, abdomen and pelvis was performed following the standard protocol without IV contrast. RADIATION DOSE REDUCTION: This exam was performed according to the departmental dose-optimization program which includes automated exposure control, adjustment of the mA and/or kV according to patient size and/or use of iterative reconstruction technique. COMPARISON:  Portable chest yesterday, AP and lateral chest 03/16/2023, flat plate abdomen film 04/09/2019, and CT abdomen and pelvis without contrast 02/04/2018. FINDINGS: CT CHEST FINDINGS Cardiovascular: Cardiac size is normal. Left-sided coronary arteries are heavily calcified. Pulmonary arteries and veins are normal in caliber. Small pericardial effusion noted. There are calcifications in the aortic valve leaflets. Scattered aortic calcific plaques without aneurysm. There is minimal calcific plaque in the great vessels. Mediastinum/Nodes: The esophageal wall is diffusely thickened including over the GE junction. Small hiatal hernia. Endoscopy recommended. Thyroid gland is atrophic without mass.  Axillary spaces are clear. No enlarged intrathoracic lymph nodes are identified without contrast. A thoracic trachea in both main bronchi are unremarkable. Lungs/Pleura: Diffuse bronchial thickening noted greater in the lower lobes. Mild biapical pleural-parenchymal scarring calcifications. There is consolidation with air bronchograms in the right lower lobe, primarily in the basal segments and could be due to atelectasis, pneumonia or aspiration. This was not seen on the AP and lateral chest from 03/16/2023. There is additional tree-in-bud interstitial micronodularity in the superior segment of the right lower lobe in the superior segment of the left lower lobe, consistent with infectious or inflammatory bronchiolitis. There is additional band consolidation with associated scattered subsegmental bronchial plugging in the medial basal left lower lobe. This could also be seen  with aspiration. The lungs are otherwise generally clear. No pleural effusion is seen. Musculoskeletal: Osteopenia with mild degenerative changes of thoracic spine. No acute or significant osseous findings. No chest wall mass. CT ABDOMEN PELVIS FINDINGS Hepatobiliary: Loss of fine detail due to streak artifacts from the patient's arms in the field. No obvious liver mass. No obvious gallbladder wall thickening or calcified stones. No biliary dilatation. Pancreas: Partially atrophic.  No focal abnormality. Spleen: No abnormality.  No splenomegaly. Adrenals/Urinary Tract: There is no adrenal mass. No contour deforming abnormality of the unenhanced kidneys. There are occasional punctate nonobstructive caliceal stones in both renal collecting systems. On the right, there is a 5 mm in length by 2 mm in diameter rod-like UPJ stone, with mild hydronephrosis upstream. Both ureters are otherwise clear. The bladder is unremarkable. There is impression on the bladder by the enlarged prostate. Stomach/Bowel: The stomach distended with air and fluid. There is diffuse small bowel dilatation except for small caliber segments in the right lower quadrant, maximum small bowel caliber 3.7 cm. A transitional segment was not found but the difference in caliber is more suggestive of obstruction than ileus. Etiology is undetermined. An appendix is not seen. There is diffuse colonic diverticulosis heaviest in the sigmoid, without diverticulitis or wall thickening. Vascular/Lymphatic: Aortic atherosclerosis. No enlarged abdominal or pelvic lymph nodes. Reproductive: Moderate to severe prostatomegaly, transverse  axis 6 cm. Other: No abdominal wall hernia. Small left inguinal fat hernia. Small volume of perihepatic and pelvic ascites. No pneumatosis, free air or free hemorrhage. No localizing collections. Musculoskeletal: Degenerative change lumbar spine, mild dextrorotary scoliosis. Most advanced degenerative change at L2-3 and L3-4. No acute  or other significant osseous findings. IMPRESSION: 1. Diffuse small bowel dilatation except for small caliber segments in the right lower quadrant, maximum small bowel caliber 3.7 cm. A transitional segment was not found but the difference in caliber is more suggestive of a distal small bowel obstruction than ileus. Etiology is undetermined. 2. Small volume of perihepatic and pelvic ascites. No pneumatosis, free air or free hemorrhage. 3. Bilateral lower lobe consolidation with air bronchograms, right greater than left, which could be due to atelectasis, pneumonia or aspiration. 4. Additional tree-in-bud interstitial micronodularity in the superior segments of both lower lobes, consistent with infectious or inflammatory bronchiolitis. 5. Diffuse esophageal wall thickening including over the GE junction. Endoscopy recommended. Small hiatal hernia. 6. Aortic and coronary artery atherosclerosis. 7. 5 mm in length by 2 mm in diameter rod-like right UPJ stone with mild hydronephrosis. 8. Nonobstructive bilateral micronephrolithiasis. 9. Prostatomegaly, transverse axis 6 cm. 10. Colonic diverticulosis without evidence of diverticulitis. 11. Osteopenia and degenerative change. Aortic Atherosclerosis (ICD10-I70.0). Electronically Signed   By: Almira Bar M.D.   On: 04/13/2023 05:21   DG Chest Port 1 View Result Date: 04/12/2023 CLINICAL DATA:  Cough, shortness of breath EXAM: PORTABLE CHEST 1 VIEW COMPARISON:  03/16/2023 FINDINGS: Heart and mediastinal contours within normal limits. Airspace opacity in the right lower lobe. Left lung clear. No effusions or acute bony abnormality. IMPRESSION: Right lower lobe airspace opacity concerning for pneumonia. Electronically Signed   By: Charlett Nose M.D.   On: 04/12/2023 19:05    Anti-infectives: Anti-infectives (From admission, onward)    Start     Dose/Rate Route Frequency Ordered Stop   04/14/23 0000  azithromycin (ZITHROMAX) 500 mg in sodium chloride 0.9 % 250 mL  IVPB        500 mg 250 mL/hr over 60 Minutes Intravenous Every 24 hours 04/13/23 0541 04/18/23 2359   04/13/23 2200  cefTRIAXone (ROCEPHIN) 2 g in sodium chloride 0.9 % 100 mL IVPB        2 g 200 mL/hr over 30 Minutes Intravenous Every 24 hours 04/13/23 0541 04/18/23 2159   04/12/23 2345  cefTRIAXone (ROCEPHIN) 1 g in sodium chloride 0.9 % 100 mL IVPB        1 g 200 mL/hr over 30 Minutes Intravenous  Once 04/12/23 2337 04/13/23 0034   04/12/23 2345  azithromycin (ZITHROMAX) 500 mg in sodium chloride 0.9 % 250 mL IVPB        500 mg 250 mL/hr over 60 Minutes Intravenous  Once 04/12/23 2337 04/13/23 0102       Assessment/Plan: Anthony Ball is an 89 y.o. male with  Possible partial small bowel obstruction Right UPJ stone Dementia COPD Hypothyroidism Community acquired pneumonia -positive influenza A Esophageal wall thickening     Believe wbc is from lungs and not abdomen. No wall thickening. No PV gas/pneumatosis.  No indication for urgent laparotomy.    Due to the lack of distention, nausea, vomiting and due to mental status changes, would leave NG tube out for now. Will repeat films today.  May switch SBO protocol to oral gastrografin.  I still doubt that the patient's current issues are coming from his abdomen.  This may be some ileus secondary to  his ongoing pneumonia.  LOS: 1 day    Wynona Luna 04/14/2023

## 2023-04-14 NOTE — Progress Notes (Signed)
RN attempted to give patient oral contrast per Dr Corliss Skains for xray in the am. Patient took 1 sip of the contrast and immediately began coughing. Patient placed in highest possible position in bed. MD Tsuei made aware of patient coughing and stated "just hold off.ice chips only. Please record any BM". No BMs on shift at this time. Patient 1:1 safety sitter left at 1500. Mitt's placed on patient for patient safety due to pulling at IV lines. Telesitter present at this time since no 1:1 sitters available. Care continues.

## 2023-04-14 NOTE — Plan of Care (Signed)
  Problem: Clinical Measurements: Goal: Ability to maintain clinical measurements within normal limits will improve Outcome: Progressing Goal: Will remain free from infection Outcome: Progressing Goal: Diagnostic test results will improve Outcome: Progressing Goal: Respiratory complications will improve Outcome: Progressing Goal: Cardiovascular complication will be avoided Outcome: Progressing   Problem: Elimination: Goal: Will not experience complications related to urinary retention Outcome: Progressing   Problem: Pain Managment: Goal: General experience of comfort will improve and/or be controlled Outcome: Progressing   Problem: Skin Integrity: Goal: Risk for impaired skin integrity will decrease Outcome: Progressing   Problem: Activity: Goal: Ability to tolerate increased activity will improve Outcome: Progressing   Problem: Clinical Measurements: Goal: Ability to maintain a body temperature in the normal range will improve Outcome: Progressing   Problem: Respiratory: Goal: Ability to maintain adequate ventilation will improve Outcome: Progressing Goal: Ability to maintain a clear airway will improve Outcome: Progressing

## 2023-04-14 NOTE — Progress Notes (Signed)
PROGRESS NOTE  KHOEN GENET EXB:[id number] DOB: 03/29/1934 DOA: 04/12/2023 PCP: Georgann Housekeeper, MD   LOS: 1 day   Brief narrative:  Anthony Ball is a 89 y.o. male with history of COPD, dementia, hypothyroidism patient to hospital with shortness of breath and productive cough.  Poor historian.  Of note patient was recently seen by cardiology and had a stress test done on 04/03/2023 which was unremarkable.  In the ED, patient was slightly tachycardic and tachypneic.  Was on 2 L of  2 L of oxygen.  Initial labs showed leukocytosis with WBC of 19.4.  Lactate was elevated at 2.9.  BNP of 45.  Patient tested positive for influenza A.  WBC was significantly elevated at 22.5.  Chest x-ray showed infiltrates.  During ED stay patient also had multiple episodes of vomiting and had distention of the abdomen.  CT scan of the abdomen pelvis showed features concerning for bowel obstruction and chest CT showed pneumonia.  He also had right ureteric stone.  Patient was then admitted hospital for further evaluation and treatment.   Assessment/Plan:  Principal Problem:   CAP (community acquired pneumonia) Active Problems:   SBO (small bowel obstruction) (HCC)   Ureteral stone   COPD (chronic obstructive pulmonary disease) (HCC)   Hypothyroidism   Dementia (HCC)   GERD (gastroesophageal reflux disease)  Small bowel obstruction -   as per CT scan of the abdomen.  Patient had abdominal distention nausea vomiting on presentation.  Was initially on NG tube with intermittent suction has been pulled by the patient at this time..  General surgery has been consulted and recommended SB0 protocol.  Procalcitonin 0.4.  Follow recommendations.  Continue IV hydration with D5 Ringer lactate.  Analgesics, antiemetics as necessary.   Pneumonia with influenza A positive, has significant leukocytosis.  On empiric antibiotic.  Blood cultures negative in 1 day.   Right UPJ stone with right-sided hydronephrosis.  Seen by  urology.  Blood cultures negative in 1 day.  Urine culture pending.  Urinalysis with no white cells.  Creatinine of 1.0 at this time.  Urology recommends Flomax 1 p.o. okay.  No recommendation for catheterization at this time.   COPD continue bronchodilators with nebulizers.   Diffuse esophageal thickening seen on the CAT scan will eventually need EGD.   Dementia takes Namenda at home.  Presently NPO.  Will restart when clinically appropriate   Hypothyroidism takes Synthroid at home.  Will hold off for now due to n.p.o. status..    DVT prophylaxis: SCDs Start: 04/13/23 0540   Disposition: Uncertain at this time.  Likely home in 2 to 3 days.  PT evaluation when stable.  Status is: Inpatient  Remains inpatient appropriate because: Pending clinical improvement    Code Status:     Code Status: Full Code  Family Communication:  Spoke with the patient's sister Ms Johnny Bridge on the phone and updated her about the clinical condition of the patient.  Discussed about CODE STATUS but patient states he was unable to make any decisions on it.  Consultants: Urology General surgery  Procedures: NG tube insertion and  removal  Anti-infectives:  Rocephin and Zithromax IV  Anti-infectives (From admission, onward)    Start     Dose/Rate Route Frequency Ordered Stop   04/14/23 0000  azithromycin (ZITHROMAX) 500 mg in sodium chloride 0.9 % 250 mL IVPB        500 mg 250 mL/hr over 60 Minutes Intravenous Every 24 hours 04/13/23 0541 04/18/23 2359  04/13/23 2200  cefTRIAXone (ROCEPHIN) 2 g in sodium chloride 0.9 % 100 mL IVPB        2 g 200 mL/hr over 30 Minutes Intravenous Every 24 hours 04/13/23 0541 04/18/23 2159   04/12/23 2345  cefTRIAXone (ROCEPHIN) 1 g in sodium chloride 0.9 % 100 mL IVPB        1 g 200 mL/hr over 30 Minutes Intravenous  Once 04/12/23 2337 04/13/23 0034   04/12/23 2345  azithromycin (ZITHROMAX) 500 mg in sodium chloride 0.9 % 250 mL IVPB        500 mg 250 mL/hr over 60  Minutes Intravenous  Once 04/12/23 2337 04/13/23 0102       Subjective: Today, patient was seen and examined at bedside.  Nursing staff reported that patient was agitated and had pulled lines yesterday including NG tube.  Secondary school teacher at bedside.   Objective: Vitals:   04/14/23 0522 04/14/23 0924  BP:  126/64  Pulse: (!) 112 99  Resp:  16  Temp:  98.1 F (36.7 C)  SpO2: 91% 92%    Intake/Output Summary (Last 24 hours) at 04/14/2023 0925 Last data filed at 04/14/2023 0756 Gross per 24 hour  Intake 1016.61 ml  Output 350 ml  Net 666.61 ml   Filed Weights   04/12/23 1757 04/13/23 1725  Weight: 61 kg 56.2 kg   Body mass index is 20 kg/m.   Physical Exam:  GENERAL: Patient is alert awake but confused and oriented to self only, elderly male, appears chronically ill HENT: No scleral pallor or icterus. Pupils equally reactive to light. Oral mucosa is moist NECK: is supple, no gross swelling noted. CHEST: Decreased breath sounds bilaterally, coarse breath sounds noted, CVS: S1 and S2 heard, no murmur. Regular rate and rhythm.  ABDOMEN: Soft, non-tender, bowel sounds are present. EXTREMITIES: No edema. CNS: Cranial nerves are intact.  Moves all extremities. SKIN: warm and dry without rashes.  Data Review: I have personally reviewed the following laboratory data and studies,  CBC: Recent Labs  Lab 04/12/23 1850 04/12/23 1900 04/13/23 0611  WBC 19.4*  --  22.5*  NEUTROABS 16.0*  --  21.3*  HGB 16.0 16.7 15.0  HCT 47.8 49.0 44.3  MCV 87.1  --  86.2  PLT 322  --  320   Basic Metabolic Panel: Recent Labs  Lab 04/12/23 1850 04/12/23 1900 04/13/23 0611  NA 136 135 138  K 3.8 3.7 3.7  CL 99  --  99  CO2 24  --  22  GLUCOSE 125*  --  164*  BUN 30*  --  29*  CREATININE 1.12  --  1.01  CALCIUM 9.1  --  9.3   Liver Function Tests: Recent Labs  Lab 04/12/23 1850 04/13/23 0611  AST 28 45*  ALT 14 18  ALKPHOS 71 70  BILITOT 0.9 0.8  PROT 6.6 6.5   ALBUMIN 3.2* 2.9*   No results for input(s): "LIPASE", "AMYLASE" in the last 168 hours. No results for input(s): "AMMONIA" in the last 168 hours. Cardiac Enzymes: No results for input(s): "CKTOTAL", "CKMB", "CKMBINDEX", "TROPONINI" in the last 168 hours. BNP (last 3 results) Recent Labs    04/12/23 1850  BNP 45.9    ProBNP (last 3 results) No results for input(s): "PROBNP" in the last 8760 hours.  CBG: Recent Labs  Lab 04/13/23 0853 04/13/23 1235 04/14/23 0106 04/14/23 0610 04/14/23 0809  GLUCAP 152* 150* 125* 103* 105*   Recent Results (from the past 240 hours)  Resp panel by RT-PCR (RSV, Flu A&B, Covid) Anterior Nasal Swab     Status: Abnormal   Collection Time: 04/12/23  6:57 PM   Specimen: Anterior Nasal Swab  Result Value Ref Range Status   SARS Coronavirus 2 by RT PCR NEGATIVE NEGATIVE Final   Influenza A by PCR POSITIVE (A) NEGATIVE Final   Influenza B by PCR NEGATIVE NEGATIVE Final    Comment: (NOTE) The Xpert Xpress SARS-CoV-2/FLU/RSV plus assay is intended as an aid in the diagnosis of influenza from Nasopharyngeal swab specimens and should not be used as a sole basis for treatment. Nasal washings and aspirates are unacceptable for Xpert Xpress SARS-CoV-2/FLU/RSV testing.  Fact Sheet for Patients: BloggerCourse.com  Fact Sheet for Healthcare Providers: SeriousBroker.it  This test is not yet approved or cleared by the [place] FDA and has been authorized for detection and/or diagnosis of SARS-CoV-2 by FDA under an Emergency Use Authorization (EUA). This EUA will remain in effect (meaning this test can be used) for the duration of the COVID-19 declaration under Section 564(b)(1) of the Act, 21 U.S.C. section 360bbb-3(b)(1), unless the authorization is terminated or revoked.     Resp Syncytial Virus by PCR NEGATIVE NEGATIVE Final    Comment: (NOTE) Fact Sheet for  Patients: BloggerCourse.com  Fact Sheet for Healthcare Providers: SeriousBroker.it  This test is not yet approved or cleared by the [place] FDA and has been authorized for detection and/or diagnosis of SARS-CoV-2 by FDA under an Emergency Use Authorization (EUA). This EUA will remain in effect (meaning this test can be used) for the duration of the COVID-19 declaration under Section 564(b)(1) of the Act, 21 U.S.C. section 360bbb-3(b)(1), unless the authorization is terminated or revoked.  Performed at [facility], [address], [place], [place] [address]   Culture, blood (routine x 2)     Status: None (Preliminary result)   Collection Time: 04/12/23 11:37 PM   Specimen: BLOOD  Result Value Ref Range Status   Specimen Description BLOOD SITE NOT SPECIFIED  Final   Special Requests   Final    BOTTLES DRAWN AEROBIC AND ANAEROBIC Blood Culture adequate volume   Culture   Final    NO GROWTH 1 DAY Performed at [facility], [address] [place], [place] [address]    Report Status PENDING  Incomplete  Culture, blood (routine x 2)     Status: None (Preliminary result)   Collection Time: 04/12/23 11:58 PM   Specimen: BLOOD RIGHT ARM  Result Value Ref Range Status   Specimen Description BLOOD RIGHT ARM  Final   Special Requests   Final    BOTTLES DRAWN AEROBIC AND ANAEROBIC Blood Culture adequate volume   Culture   Final    NO GROWTH 1 DAY Performed at [facility], [address], [place], [place] [address]    Report Status PENDING  Incomplete     Studies: DG Abd Portable 1V Result Date: 04/14/2023 CLINICAL DATA:  Small-bowel obstruction. EXAM: PORTABLE ABDOMEN - 1 VIEW COMPARISON:  04/13/2023 FINDINGS: Moderate dilatation of small bowel shows no significant change since previous study. Relative paucity of colonic gas also noted in suspicious for small bowel obstruction. IMPRESSION: No significant change  in small bowel dilatation, suspicious for small bowel obstruction. Electronically Signed   By: Danae Orleans M.D.   On: 04/14/2023 09:17   DG Abd 1 View Result Date: 04/13/2023 CLINICAL DATA:  NG tube EXAM: ABDOMEN - 1 VIEW COMPARISON:  Chest x-ray  04/13/2023 FINDINGS: Enteric tube tip is at the level of the gastroesophageal junction. Dilated small bowel loops are again seen. There are atelectatic changes in the right lung base. IMPRESSION: Enteric tube tip is at the level of the gastroesophageal junction. Recommend advancement 10 cm. Electronically Signed   By: Darliss Cheney M.D.   On: 04/13/2023 20:13   DG Abd Portable 1 View Result Date: 04/13/2023 CLINICAL DATA:  NG tube placement. EXAM: PORTABLE ABDOMEN - 1 VIEW COMPARISON:  Radiograph earlier today FINDINGS: Tip and side port of the enteric tube below the diaphragm in the stomach. Gaseous bowel distention in the upper abdomen again seen. Patchy right lung base opacity. IMPRESSION: Tip and side port of the enteric tube below the diaphragm in the stomach. Electronically Signed   By: Narda Rutherford M.D.   On: 04/13/2023 16:47   DG Abd Portable 1 View Result Date: 04/13/2023 CLINICAL DATA:  NG tube placement EXAM: PORTABLE ABDOMEN - 1 VIEW COMPARISON:  04/13/2023 FINDINGS: Enteric tube tip is retracted, side-port at the level of the distal esophagus, tip in the region of GE junction. Dilated small bowel in the upper abdomen. Atelectasis or infiltrates at the bases. IMPRESSION: Enteric tube tip is retracted, side-port at the level of the distal esophagus, tip in the region of GE junction. Recommend advancement by around 10 cm for more optimal positioning Electronically Signed   By: Jasmine Pang M.D.   On: 04/13/2023 15:16   DG Abd Portable 1 View Result Date: 04/13/2023 CLINICAL DATA:  NG tube placement. EXAM: PORTABLE ABDOMEN - 1 VIEW COMPARISON:  04/09/2019 FINDINGS: NG tube tip is in the gastric fundus with proximal side port below the GE junction.  Diffuse gaseous small bowel distension is seen in the visualized upper abdomen with small bowel loops measuring up to 3.5 cm. Chronic interstitial changes noted in the visualized lung bases. IMPRESSION: 1. NG tube tip is in the gastric fundus. 2. Diffuse gaseous small bowel distension. Electronically Signed   By: Kennith Center M.D.   On: 04/13/2023 07:06   CT CHEST ABDOMEN PELVIS WO CONTRAST Result Date: 04/13/2023 CLINICAL DATA:  Shortness of breath, coughing and congestion, suspected small-bowel obstruction with abdominal pain. EXAM: CT CHEST, ABDOMEN AND PELVIS WITHOUT CONTRAST TECHNIQUE: Multidetector CT imaging of the chest, abdomen and pelvis was performed following the standard protocol without IV contrast. RADIATION DOSE REDUCTION: This exam was performed according to the departmental dose-optimization program which includes automated exposure control, adjustment of the mA and/or kV according to patient size and/or use of iterative reconstruction technique. COMPARISON:  Portable chest yesterday, AP and lateral chest 03/16/2023, flat plate abdomen film 04/09/2019, and CT abdomen and pelvis without contrast 02/04/2018. FINDINGS: CT CHEST FINDINGS Cardiovascular: Cardiac size is normal. Left-sided coronary arteries are heavily calcified. Pulmonary arteries and veins are normal in caliber. Small pericardial effusion noted. There are calcifications in the aortic valve leaflets. Scattered aortic calcific plaques without aneurysm. There is minimal calcific plaque in the great vessels. Mediastinum/Nodes: The esophageal wall is diffusely thickened including over the GE junction. Small hiatal hernia. Endoscopy recommended. Thyroid gland is atrophic without mass.  Axillary spaces are clear. No enlarged intrathoracic lymph nodes are identified without contrast. A thoracic trachea in both main bronchi are unremarkable. Lungs/Pleura: Diffuse bronchial thickening noted greater in the lower lobes. Mild biapical  pleural-parenchymal scarring calcifications. There is consolidation with air bronchograms in the right lower lobe, primarily in the basal segments and could be due to atelectasis, pneumonia or aspiration. This was  not seen on the AP and lateral chest from 03/16/2023. There is additional tree-in-bud interstitial micronodularity in the superior segment of the right lower lobe in the superior segment of the left lower lobe, consistent with infectious or inflammatory bronchiolitis. There is additional band consolidation with associated scattered subsegmental bronchial plugging in the medial basal left lower lobe. This could also be seen with aspiration. The lungs are otherwise generally clear. No pleural effusion is seen. Musculoskeletal: Osteopenia with mild degenerative changes of thoracic spine. No acute or significant osseous findings. No chest wall mass. CT ABDOMEN PELVIS FINDINGS Hepatobiliary: Loss of fine detail due to streak artifacts from the patient's arms in the field. No obvious liver mass. No obvious gallbladder wall thickening or calcified stones. No biliary dilatation. Pancreas: Partially atrophic.  No focal abnormality. Spleen: No abnormality.  No splenomegaly. Adrenals/Urinary Tract: There is no adrenal mass. No contour deforming abnormality of the unenhanced kidneys. There are occasional punctate nonobstructive caliceal stones in both renal collecting systems. On the right, there is a 5 mm in length by 2 mm in diameter rod-like UPJ stone, with mild hydronephrosis upstream. Both ureters are otherwise clear. The bladder is unremarkable. There is impression on the bladder by the enlarged prostate. Stomach/Bowel: The stomach distended with air and fluid. There is diffuse small bowel dilatation except for small caliber segments in the right lower quadrant, maximum small bowel caliber 3.7 cm. A transitional segment was not found but the difference in caliber is more suggestive of obstruction than ileus.  Etiology is undetermined. An appendix is not seen. There is diffuse colonic diverticulosis heaviest in the sigmoid, without diverticulitis or wall thickening. Vascular/Lymphatic: Aortic atherosclerosis. No enlarged abdominal or pelvic lymph nodes. Reproductive: Moderate to severe prostatomegaly, transverse axis 6 cm. Other: No abdominal wall hernia. Small left inguinal fat hernia. Small volume of perihepatic and pelvic ascites. No pneumatosis, free air or free hemorrhage. No localizing collections. Musculoskeletal: Degenerative change lumbar spine, mild dextrorotary scoliosis. Most advanced degenerative change at L2-3 and L3-4. No acute or other significant osseous findings. IMPRESSION: 1. Diffuse small bowel dilatation except for small caliber segments in the right lower quadrant, maximum small bowel caliber 3.7 cm. A transitional segment was not found but the difference in caliber is more suggestive of a distal small bowel obstruction than ileus. Etiology is undetermined. 2. Small volume of perihepatic and pelvic ascites. No pneumatosis, free air or free hemorrhage. 3. Bilateral lower lobe consolidation with air bronchograms, right greater than left, which could be due to atelectasis, pneumonia or aspiration. 4. Additional tree-in-bud interstitial micronodularity in the superior segments of both lower lobes, consistent with infectious or inflammatory bronchiolitis. 5. Diffuse esophageal wall thickening including over the GE junction. Endoscopy recommended. Small hiatal hernia. 6. Aortic and coronary artery atherosclerosis. 7. 5 mm in length by 2 mm in diameter rod-like right UPJ stone with mild hydronephrosis. 8. Nonobstructive bilateral micronephrolithiasis. 9. Prostatomegaly, transverse axis 6 cm. 10. Colonic diverticulosis without evidence of diverticulitis. 11. Osteopenia and degenerative change. Aortic Atherosclerosis (ICD10-I70.0). Electronically Signed   By: Almira Bar M.D.   On: 04/13/2023 05:21   DG  Chest Port 1 View Result Date: 04/12/2023 CLINICAL DATA:  Cough, shortness of breath EXAM: PORTABLE CHEST 1 VIEW COMPARISON:  03/16/2023 FINDINGS: Heart and mediastinal contours within normal limits. Airspace opacity in the right lower lobe. Left lung clear. No effusions or acute bony abnormality. IMPRESSION: Right lower lobe airspace opacity concerning for pneumonia. Electronically Signed   By: Charlett Nose M.D.  On: 04/12/2023 19:05      Joycelyn Das, MD  [facility] 04/14/2023  If 7PM-7AM, please contact night-coverage

## 2023-04-15 ENCOUNTER — Inpatient Hospital Stay (HOSPITAL_COMMUNITY): Payer: Medicare Other

## 2023-04-15 DIAGNOSIS — J189 Pneumonia, unspecified organism: Secondary | ICD-10-CM | POA: Diagnosis not present

## 2023-04-15 LAB — BASIC METABOLIC PANEL
Anion gap: 10 (ref 5–15)
BUN: 22 mg/dL (ref 8–23)
CO2: 26 mmol/L (ref 22–32)
Calcium: 8.6 mg/dL — ABNORMAL LOW (ref 8.9–10.3)
Chloride: 110 mmol/L (ref 98–111)
Creatinine, Ser: 0.96 mg/dL (ref 0.61–1.24)
GFR, Estimated: >60 mL/min (ref >60)
Glucose, Bld: 116 mg/dL — ABNORMAL HIGH (ref 70–99)
Potassium: 3.3 mmol/L — ABNORMAL LOW (ref 3.5–5.1)
Sodium: 146 mmol/L — ABNORMAL HIGH (ref 135–145)

## 2023-04-15 LAB — GLUCOSE, CAPILLARY
Glucose-Capillary: 102 mg/dL — ABNORMAL HIGH (ref 70–99)
Glucose-Capillary: 94 mg/dL (ref 70–99)

## 2023-04-15 LAB — CBC
HCT: 39.9 % (ref 39.0–52.0)
Hemoglobin: 13 g/dL (ref 13.0–17.0)
MCH: 28.6 pg (ref 26.0–34.0)
MCHC: 32.6 g/dL (ref 30.0–36.0)
MCV: 87.7 fL (ref 80.0–100.0)
Platelets: 378 10*3/uL (ref 150–400)
RBC: 4.55 MIL/uL (ref 4.22–5.81)
RDW: 14 % (ref 11.5–15.5)
WBC: 13.5 10*3/uL — ABNORMAL HIGH (ref 4.0–10.5)
nRBC: 0 % (ref 0.0–0.2)

## 2023-04-15 MED ORDER — MEMANTINE HCL 10 MG PO TABS
10.0000 mg | ORAL_TABLET | Freq: Every day | ORAL | Status: DC
  Administered 2023-04-15 – 2023-05-14 (×30): 10 mg via ORAL
  Filled 2023-04-15 (×30): qty 1

## 2023-04-15 MED ORDER — DEXTROSE IN LACTATED RINGERS 5 % IV SOLN: INTRAVENOUS | Status: AC

## 2023-04-15 MED ORDER — QUETIAPINE FUMARATE 25 MG PO TABS
25.0000 mg | ORAL_TABLET | Freq: Two times a day (BID) | ORAL | Status: DC
  Administered 2023-04-15 – 2023-05-14 (×60): 25 mg via ORAL
  Filled 2023-04-15 (×61): qty 1

## 2023-04-15 MED ORDER — LEVOTHYROXINE SODIUM 25 MCG PO TABS
25.0000 ug | ORAL_TABLET | Freq: Every day | ORAL | Status: DC
  Administered 2023-04-15 – 2023-05-15 (×31): 25 ug via ORAL
  Filled 2023-04-15 (×31): qty 1

## 2023-04-15 NOTE — Progress Notes (Signed)
Subjective/Chief Complaint:  No nausea or vomiting with NG out No recorded bowel movements plain films today show some improvement   Objective: Vital signs in last 24 hours: Temp:  [98.1 F (36.7 C)-98.4 F (36.9 C)] 98.4 F (36.9 C) (01/26 0824) Pulse Rate:  [61-100] 90 (01/26 0824) Resp:  [15-17] 16 (01/26 0824) BP: (130-149)/(64-81) 149/75 (01/26 0824) SpO2:  [84 %-100 %] 92 % (01/26 0824)    Intake/Output from previous day: 01/25 0701 - 01/26 0700 In: 59.2 [I.V.:59.2] Out: 425 [Urine:425] Intake/Output this shift: No intake/output data recorded.  Elderly male Confused Indicates no abdominal pain Abd - soft, minimal distention, non-tender Soft mitt restraints  Lab Results:  Recent Labs    04/14/23 0945 04/15/23 0855  WBC 18.4* 13.5*  HGB 13.4 13.0  HCT 40.8 39.9  PLT 332 378   BMET Recent Labs    04/14/23 0945 04/15/23 0855  NA 144 146*  K 3.3* 3.3*  CL 105 110  CO2 26 26  GLUCOSE 109* 116*  BUN 29* 22  CREATININE 1.15 0.96  CALCIUM 9.1 8.6*   PT/INR No results for input(s): "LABPROT", "INR" in the last 72 hours. ABG Recent Labs    04/12/23 1900  HCO3 26.6    Studies/Results: DG Abd Portable 1V Result Date: 04/15/2023 CLINICAL DATA:  Small bowel obstruction. EXAM: PORTABLE ABDOMEN - 1 VIEW COMPARISON:  04/14/2023 FINDINGS: General decrease in diffuse gaseous distension although dilated gas-filled small bowel the left upper quadrant still measures up to 4.5 cm diameter. There is some gas visible in the nondilated left colon on the current study. Convex rightward lumbar scoliosis evident. IMPRESSION: General decrease in diffuse gaseous bowel distension although dilated gas-filled small bowel in the left upper quadrant still measures up to 4.5 cm diameter. Electronically Signed   By: Kennith Center M.D.   On: 04/15/2023 11:21   DG Abd Portable 1V Result Date: 04/14/2023 CLINICAL DATA:  Small-bowel obstruction. EXAM: PORTABLE ABDOMEN - 1 VIEW  COMPARISON:  04/13/2023 FINDINGS: Moderate dilatation of small bowel shows no significant change since previous study. Relative paucity of colonic gas also noted in suspicious for small bowel obstruction. IMPRESSION: No significant change in small bowel dilatation, suspicious for small bowel obstruction. Electronically Signed   By: Danae Orleans M.D.   On: 04/14/2023 09:17   DG Abd 1 View Result Date: 04/13/2023 CLINICAL DATA:  NG tube EXAM: ABDOMEN - 1 VIEW COMPARISON:  Chest x-ray 04/13/2023 FINDINGS: Enteric tube tip is at the level of the gastroesophageal junction. Dilated small bowel loops are again seen. There are atelectatic changes in the right lung base. IMPRESSION: Enteric tube tip is at the level of the gastroesophageal junction. Recommend advancement 10 cm. Electronically Signed   By: Darliss Cheney M.D.   On: 04/13/2023 20:13   DG Abd Portable 1 View Result Date: 04/13/2023 CLINICAL DATA:  NG tube placement. EXAM: PORTABLE ABDOMEN - 1 VIEW COMPARISON:  Radiograph earlier today FINDINGS: Tip and side port of the enteric tube below the diaphragm in the stomach. Gaseous bowel distention in the upper abdomen again seen. Patchy right lung base opacity. IMPRESSION: Tip and side port of the enteric tube below the diaphragm in the stomach. Electronically Signed   By: Narda Rutherford M.D.   On: 04/13/2023 16:47   DG Abd Portable 1 View Result Date: 04/13/2023 CLINICAL DATA:  NG tube placement EXAM: PORTABLE ABDOMEN - 1 VIEW COMPARISON:  04/13/2023 FINDINGS: Enteric tube tip is retracted, side-port at the level of the  distal esophagus, tip in the region of GE junction. Dilated small bowel in the upper abdomen. Atelectasis or infiltrates at the bases. IMPRESSION: Enteric tube tip is retracted, side-port at the level of the distal esophagus, tip in the region of GE junction. Recommend advancement by around 10 cm for more optimal positioning Electronically Signed   By: Jasmine Pang M.D.   On: 04/13/2023  15:16    Anti-infectives: Anti-infectives (From admission, onward)    Start     Dose/Rate Route Frequency Ordered Stop   04/14/23 0000  azithromycin (ZITHROMAX) 500 mg in sodium chloride 0.9 % 250 mL IVPB        500 mg 250 mL/hr over 60 Minutes Intravenous Every 24 hours 04/13/23 0541 04/18/23 2359   04/13/23 2200  cefTRIAXone (ROCEPHIN) 2 g in sodium chloride 0.9 % 100 mL IVPB        2 g 200 mL/hr over 30 Minutes Intravenous Every 24 hours 04/13/23 0541 04/18/23 2159   04/12/23 2345  cefTRIAXone (ROCEPHIN) 1 g in sodium chloride 0.9 % 100 mL IVPB        1 g 200 mL/hr over 30 Minutes Intravenous  Once 04/12/23 2337 04/13/23 0034   04/12/23 2345  azithromycin (ZITHROMAX) 500 mg in sodium chloride 0.9 % 250 mL IVPB        500 mg 250 mL/hr over 60 Minutes Intravenous  Once 04/12/23 2337 04/13/23 0102       Assessment/Plan: Anthony Ball is an 89 y.o. male with possible partial small bowel obstruction Right UPJ stone Dementia COPD Hypothyroidism Community acquired pneumonia -positive influenza A Esophageal wall thickening     No indication for urgent laparotomy.    Due to the lack of distention, nausea and vomiting and due to mental status changes and improvement in plain films, would leave NG tube out for now. Repeat films in AM This may be some ileus secondary to his ongoing pneumonia.  LOS: 2 days    Anthony Ball 04/15/2023

## 2023-04-15 NOTE — Progress Notes (Signed)
PROGRESS NOTE  Anthony Ball VHQ:[id number] DOB: Jan 29, 1935 DOA: 04/12/2023 PCP: Georgann Housekeeper, MD   LOS: 2 days   Brief narrative:  Anthony Ball is a 89 y.o. male with history of COPD, dementia, hypothyroidism patient to hospital with shortness of breath and productive cough.  Poor historian.  Of note patient was recently seen by cardiology and had a stress test done on 04/03/2023 which was unremarkable.  In the ED, patient was slightly tachycardic and tachypneic.  Was on 2 L of  2 L of oxygen.  Initial labs showed leukocytosis with WBC of 19.4.  Lactate was elevated at 2.9.  BNP of 45.  Patient tested positive for influenza A.  WBC was significantly elevated at 22.5.  Chest x-ray showed infiltrates.  During ED stay patient also had multiple episodes of vomiting and had distention of the abdomen.  CT scan of the abdomen pelvis showed features concerning for bowel obstruction and chest CT showed pneumonia.  He also had right ureteric stone.  Patient was then admitted hospital for further evaluation and treatment.   Assessment/Plan:  Principal Problem:   CAP (community acquired pneumonia) Active Problems:   SBO (small bowel obstruction) (HCC)   Ureteral stone   COPD (chronic obstructive pulmonary disease) (HCC)   Hypothyroidism   Dementia (HCC)   GERD (gastroesophageal reflux disease)  Possible partial small bowel obstruction -   as per CT scan of the abdomen.  Patient had abdominal distention nausea vomiting on presentation.  Was initially NG tube which has been discontinued by the patient.  General surgery following.  Procalcitonin 0.4.  Follow recommendations.  Continue IV hydration with D5 Ringer lactate.  Analgesics, antiemetics as necessary.   Pneumonia with influenza A positive, leukocytosis trending down at 13.5.  Blood cultures negative in 2 days..  On empiric antibiotic with Rocephin and Zithromax..     Right UPJ stone with right-sided hydronephrosis.  Seen by urology.  Blood  cultures negative in 2 days.  .  Urine culture no growth so far.  Urinalysis with no white cells.  Urology recommends Flomax 1 p.o. okay.  No recommendation for catheterization at this time.  Currently receiving doxazosin.  Creatinine of 0.9.   COPD continue bronchodilators with nebulizers.  Appears to be compensated.   Diffuse esophageal thickening seen on the CAT scan will eventually need EGD.   Dementia takes Namenda at home.  Clinically n.p.o. with meds show will restart Namenda.   Hypothyroidism takes Synthroid at home.  Will restart     DVT prophylaxis: SCDs Start: 04/13/23 0540   Disposition: Uncertain at this time.  Will get PT evaluation.  Status is: Inpatient  Remains inpatient appropriate because: Pending clinical improvement    Code Status:     Code Status: Full Code  Family Communication:  Spoke with the patient's sister Ms Johnny Bridge on the phone and updated her about the clinical condition of the patient.  Discussed about CODE STATUS but  she was unable to make any decisions on it.  Consultants: Urology General surgery  Procedures: NG tube insertion and  removal  Anti-infectives:  Rocephin and Zithromax IV  Anti-infectives (From admission, onward)    Start     Dose/Rate Route Frequency Ordered Stop   04/14/23 0000  azithromycin (ZITHROMAX) 500 mg in sodium chloride 0.9 % 250 mL IVPB        500 mg 250 mL/hr over 60 Minutes Intravenous Every 24 hours 04/13/23 0541 04/18/23 2359   04/13/23 2200  cefTRIAXone (ROCEPHIN)  2 g in sodium chloride 0.9 % 100 mL IVPB        2 g 200 mL/hr over 30 Minutes Intravenous Every 24 hours 04/13/23 0541 04/18/23 2159   04/12/23 2345  cefTRIAXone (ROCEPHIN) 1 g in sodium chloride 0.9 % 100 mL IVPB        1 g 200 mL/hr over 30 Minutes Intravenous  Once 04/12/23 2337 04/13/23 0034   04/12/23 2345  azithromycin (ZITHROMAX) 500 mg in sodium chloride 0.9 % 250 mL IVPB        500 mg 250 mL/hr over 60 Minutes Intravenous  Once  04/12/23 2337 04/13/23 0102       Subjective: Today, patient was seen and examined at bedside.  Has been having episodes of confusion agitation trying to climb out of the bed.  Had a seizure yesterday.  Poor historian.  Denies any pain, nausea or vomiting.   Objective: Vitals:   04/15/23 0644 04/15/23 0824  BP: 130/64 (!) 149/75  Pulse: 61 90  Resp: 15 16  Temp:  98.4 F (36.9 C)  SpO2: (!) 84% 92%    Intake/Output Summary (Last 24 hours) at 04/15/2023 1012 Last data filed at 04/14/2023 1624 Gross per 24 hour  Intake 59.17 ml  Output --  Net 59.17 ml   Filed Weights   04/12/23 1757 04/13/23 1725  Weight: 61 kg 56.2 kg   Body mass index is 20 kg/m.   Physical Exam:  GENERAL: Patient is alert awake but confused and oriented to self only, elderly male, appears chronically ill, confused and disoriented trying to get out of the bed. HENT: No scleral pallor or icterus. Pupils equally reactive to light. Oral mucosa is moist NECK: is supple, no gross swelling noted. CHEST: Decreased breath sounds bilaterally,  CVS: S1 and S2 heard, no murmur. Regular rate and rhythm.  ABDOMEN: Soft, non-tender, bowel sounds are present. EXTREMITIES: No edema. CNS: Cranial nerves are intact.  Moves all extremities. SKIN: warm and dry without rashes.  Data Review: I have personally reviewed the following laboratory data and studies,  CBC: Recent Labs  Lab 04/12/23 1850 04/12/23 1900 04/13/23 0611 04/14/23 0945 04/15/23 0855  WBC 19.4*  --  22.5* 18.4* 13.5*  NEUTROABS 16.0*  --  21.3*  --   --   HGB 16.0 16.7 15.0 13.4 13.0  HCT 47.8 49.0 44.3 40.8 39.9  MCV 87.1  --  86.2 87.2 87.7  PLT 322  --  320 332 378   Basic Metabolic Panel: Recent Labs  Lab 04/12/23 1850 04/12/23 1900 04/13/23 0611 04/14/23 0945 04/15/23 0855  NA 136 135 138 144 146*  K 3.8 3.7 3.7 3.3* 3.3*  CL 99  --  99 105 110  CO2 24  --  22 26 26   GLUCOSE 125*  --  164* 109* 116*  BUN 30*  --  29* 29* 22   CREATININE 1.12  --  1.01 1.15 0.96  CALCIUM 9.1  --  9.3 9.1 8.6*  MG  --   --   --  2.3  --   PHOS  --   --   --  2.3*  --    Liver Function Tests: Recent Labs  Lab 04/12/23 1850 04/13/23 0611  AST 28 45*  ALT 14 18  ALKPHOS 71 70  BILITOT 0.9 0.8  PROT 6.6 6.5  ALBUMIN 3.2* 2.9*   No results for input(s): "LIPASE", "AMYLASE" in the last 168 hours. No results for input(s): "AMMONIA" in the  last 168 hours. Cardiac Enzymes: No results for input(s): "CKTOTAL", "CKMB", "CKMBINDEX", "TROPONINI" in the last 168 hours. BNP (last 3 results) Recent Labs    04/12/23 1850  BNP 45.9    ProBNP (last 3 results) No results for input(s): "PROBNP" in the last 8760 hours.  CBG: Recent Labs  Lab 04/14/23 0610 04/14/23 0809 04/14/23 1229 04/15/23 0015 04/15/23 0647  GLUCAP 103* 105* 102* 94 102*   Recent Results (from the past 240 hours)  Resp panel by RT-PCR (RSV, Flu A&B, Covid) Anterior Nasal Swab     Status: Abnormal   Collection Time: 04/12/23  6:57 PM   Specimen: Anterior Nasal Swab  Result Value Ref Range Status   SARS Coronavirus 2 by RT PCR NEGATIVE NEGATIVE Final   Influenza A by PCR POSITIVE (A) NEGATIVE Final   Influenza B by PCR NEGATIVE NEGATIVE Final    Comment: (NOTE) The Xpert Xpress SARS-CoV-2/FLU/RSV plus assay is intended as an aid in the diagnosis of influenza from Nasopharyngeal swab specimens and should not be used as a sole basis for treatment. Nasal washings and aspirates are unacceptable for Xpert Xpress SARS-CoV-2/FLU/RSV testing.  Fact Sheet for Patients: BloggerCourse.com  Fact Sheet for Healthcare Providers: SeriousBroker.it  This test is not yet approved or cleared by the [place] FDA and has been authorized for detection and/or diagnosis of SARS-CoV-2 by FDA under an Emergency Use Authorization (EUA). This EUA will remain in effect (meaning this test can be used) for the duration of  the COVID-19 declaration under Section 564(b)(1) of the Act, 21 U.S.C. section 360bbb-3(b)(1), unless the authorization is terminated or revoked.     Resp Syncytial Virus by PCR NEGATIVE NEGATIVE Final    Comment: (NOTE) Fact Sheet for Patients: BloggerCourse.com  Fact Sheet for Healthcare Providers: SeriousBroker.it  This test is not yet approved or cleared by the [place] FDA and has been authorized for detection and/or diagnosis of SARS-CoV-2 by FDA under an Emergency Use Authorization (EUA). This EUA will remain in effect (meaning this test can be used) for the duration of the COVID-19 declaration under Section 564(b)(1) of the Act, 21 U.S.C. section 360bbb-3(b)(1), unless the authorization is terminated or revoked.  Performed at [facility], [address] [place], [place] [address]   Culture, blood (routine x 2)     Status: None (Preliminary result)   Collection Time: 04/12/23 11:37 PM   Specimen: BLOOD  Result Value Ref Range Status   Specimen Description BLOOD SITE NOT SPECIFIED  Final   Special Requests   Final    BOTTLES DRAWN AEROBIC AND ANAEROBIC Blood Culture adequate volume   Culture   Final    NO GROWTH 2 DAYS Performed at [facility], [address], [place], [place] [address]    Report Status PENDING  Incomplete  Culture, blood (routine x 2)     Status: None (Preliminary result)   Collection Time: 04/12/23 11:58 PM   Specimen: BLOOD RIGHT ARM  Result Value Ref Range Status   Specimen Description BLOOD RIGHT ARM  Final   Special Requests   Final    BOTTLES DRAWN AEROBIC AND ANAEROBIC Blood Culture adequate volume   Culture   Final    NO GROWTH 2 DAYS Performed at [facility], [address] [place], [place] [address]    Report Status PENDING  Incomplete  Urine Culture (for pregnant, neutropenic or urologic patients or patients with an indwelling urinary catheter)     Status:  None   Collection Time: 04/13/23  8:26 AM   Specimen: Urine, Clean Catch  Result Value Ref Range Status   Specimen Description URINE, CLEAN CATCH  Final   Special Requests NONE  Final   Culture   Final    NO GROWTH Performed at [facility], [address], [place], [place] [address]    Report Status 04/14/2023 FINAL  Final     Studies: DG Abd Portable 1V Result Date: 04/14/2023 CLINICAL DATA:  Small-bowel obstruction. EXAM: PORTABLE ABDOMEN - 1 VIEW COMPARISON:  04/13/2023 FINDINGS: Moderate dilatation of small bowel shows no significant change since previous study. Relative paucity of colonic gas also noted in suspicious for small bowel obstruction. IMPRESSION: No significant change in small bowel dilatation, suspicious for small bowel obstruction. Electronically Signed   By: Danae Orleans M.D.   On: 04/14/2023 09:17   DG Abd 1 View Result Date: 04/13/2023 CLINICAL DATA:  NG tube EXAM: ABDOMEN - 1 VIEW COMPARISON:  Chest x-ray 04/13/2023 FINDINGS: Enteric tube tip is at the level of the gastroesophageal junction. Dilated small bowel loops are again seen. There are atelectatic changes in the right lung base. IMPRESSION: Enteric tube tip is at the level of the gastroesophageal junction. Recommend advancement 10 cm. Electronically Signed   By: Darliss Cheney M.D.   On: 04/13/2023 20:13   DG Abd Portable 1 View Result Date: 04/13/2023 CLINICAL DATA:  NG tube placement. EXAM: PORTABLE ABDOMEN - 1 VIEW COMPARISON:  Radiograph earlier today FINDINGS: Tip and side port of the enteric tube below the diaphragm in the stomach. Gaseous bowel distention in the upper abdomen again seen. Patchy right lung base opacity. IMPRESSION: Tip and side port of the enteric tube below the diaphragm in the stomach. Electronically Signed   By: Narda Rutherford M.D.   On: 04/13/2023 16:47   DG Abd Portable 1 View Result Date: 04/13/2023 CLINICAL DATA:  NG tube placement EXAM: PORTABLE ABDOMEN - 1 VIEW COMPARISON:   04/13/2023 FINDINGS: Enteric tube tip is retracted, side-port at the level of the distal esophagus, tip in the region of GE junction. Dilated small bowel in the upper abdomen. Atelectasis or infiltrates at the bases. IMPRESSION: Enteric tube tip is retracted, side-port at the level of the distal esophagus, tip in the region of GE junction. Recommend advancement by around 10 cm for more optimal positioning Electronically Signed   By: Jasmine Pang M.D.   On: 04/13/2023 15:16      Joycelyn Das, MD  [facility] 04/15/2023  If 7PM-7AM, please contact night-coverage

## 2023-04-15 NOTE — Evaluation (Signed)
Physical Therapy Evaluation Patient Details Name: Anthony Ball MRN: [id number] DOB: Sep 18, 1934 Today's Date: 04/15/2023  History of Present Illness  Anthony Ball is a 89 y.o. male  patient to hospital with shortness of breath and productive cough; influenza a with pneumonia, partial SBO with history of COPD, dementia, hypothyroidism  Clinical Impression   Pt admitted with above diagnosis. Lives at home alone per chart review, may have a few steps to enter his home, and pt indicated he doesn't have to consistently go up/sown stairs on a daily basis; Questionable historian;   Presents to PT with generalized decr activity tolerance, impulsivity, decr orientation;  Hard to get a good read on available assist for pt, though in chart review noted a sister lives near; currently needing min assist to steady in standing, and multimodal cues for pt to sit safely down in recliner at bedside; Patient will benefit from continued inpatient follow up therapy, <3 hours/day -- and it is likely time to have the conversations re: getting Anthony Ball to a more sustainable long-term living situation (ALF? Living with family?);   Pt currently with functional limitations due to the deficits listed below (see PT Problem List). Pt will benefit from skilled PT to increase their independence and safety with mobility to allow discharge to the venue listed below.           If plan is discharge home, recommend the following: A lot of help with walking and/or transfers;A lot of help with bathing/dressing/bathroom;Direct supervision/assist for medications management;Assistance with cooking/housework;Assist for transportation;Direct supervision/assist for financial management;Help with stairs or ramp for entrance;Supervision due to cognitive status   Can travel by private vehicle    (likely soon)    Equipment Recommendations Rolling walker (2 wheels);BSC/3in1  Recommendations for Other Services  OT consult (as ordered)     Functional Status Assessment Patient has had a recent decline in their functional status and demonstrates the ability to make significant improvements in function in a reasonable and predictable amount of time.     Precautions / Restrictions Precautions Precautions: Fall Precaution Comments: watch O2 sats on room air Restrictions Weight Bearing Restrictions Per Provider Order: No Other Position/Activity Restrictions: watch O2 sats on room air      Mobility  Bed Mobility               General bed mobility comments: Pt was standing EOB, bed alarming, when this PT arrived    Transfers Overall transfer level: Needs assistance Equipment used: None Transfers: Sit to/from Stand, Bed to chair/wheelchair/BSC Sit to Stand: Min assist   Step pivot transfers: Min assist       General transfer comment: Adequate strength to power up to stand; consistently using UE support from bedrails and this PT    Ambulation/Gait               General Gait Details: deferred at this time for safety and IV site assessment  Stairs            Wheelchair Mobility     Tilt Bed    Modified Rankin (Stroke Patients Only)       Balance                                             Pertinent Vitals/Pain Pain Assessment Pain Assessment: Faces Faces Pain Scale: Hurts a little bit Pain Location:  RUE IV Site; says it hurst "just a little" Pain Descriptors / Indicators: Grimacing Pain Intervention(s): Monitored during session    Home Living Family/patient expects to be discharged to:: Private residence Living Arrangements: Alone                 Additional Comments: Unclear historian, and family has been difficult to contact    Prior Function Prior Level of Function : Patient poor historian/Family not available             Mobility Comments: Given pt has been living alone, can infer that simple mobility was without terrible difficulty        Extremity/Trunk Assessment   Upper Extremity Assessment Upper Extremity Assessment: Defer to OT evaluation    Lower Extremity Assessment Lower Extremity Assessment: Generalized weakness (but notably able to maintain standing at bedside while attending to the IV in his R UE)    Cervical / Trunk Assessment Cervical / Trunk Assessment: Normal  Communication   Communication Communication: Hearing impairment Cueing Techniques: Verbal cues;Gestural cues;Tactile cues;Visual cues  Cognition Arousal: Alert Behavior During Therapy: WFL for tasks assessed/performed, Impulsive Overall Cognitive Status: No family/caregiver present to determine baseline cognitive functioning Area of Impairment: Orientation, Memory, Safety/judgement                 Orientation Level: Person   Memory: Decreased short-term memory   Safety/Judgement: Decreased awareness of safety, Decreased awareness of deficits     General Comments: Able to state his name and DOB; Unable to name where he is and why; 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"        General Comments General comments (skin integrity, edema, etc.): Pt had removed his telemetry, supplemental O2, and IV was nearly out upon arrival; O2 sats on room air 86%; restarted supplemental O2 2L and HR was 88bpm, O2 sats 97% at end of session    Exercises     Assessment/Plan    PT Assessment Patient needs continued PT services  PT Problem List Decreased strength;Decreased balance;Decreased mobility;Decreased coordination;Decreased cognition;Decreased knowledge of use of DME;Decreased safety awareness;Decreased knowledge of precautions;Cardiopulmonary status limiting activity       PT Treatment Interventions DME instruction;Gait training;Stair training;Functional mobility training;Therapeutic exercise;Therapeutic activities;Balance training;Neuromuscular re-education;Cognitive remediation;Patient/family education    PT Goals  (Current goals can be found in the Care Plan section)  Acute Rehab PT Goals Patient Stated Goal: Agraable to getting back to bed PT Goal Formulation: Patient unable to participate in goal setting Time For Goal Achievement: 04/29/23 Potential to Achieve Goals: Good    Frequency Min 1X/week     Co-evaluation               AM-PAC PT "6 Clicks" Mobility  Outcome Measure Help needed turning from your back to your side while in a flat bed without using bedrails?: None Help needed moving from lying on your back to sitting on the side of a flat bed without using bedrails?: A Little Help needed moving to and from a bed to a chair (including a wheelchair)?: A Lot Help needed standing up from a chair using your arms (e.g., wheelchair or bedside chair)?: A Lot Help needed to walk in hospital room?: A Lot Help needed climbing 3-5 steps with a railing? : A Lot 6 Click Score: 15    End of Session Equipment Utilized During Treatment: Oxygen Activity Tolerance: Patient tolerated treatment well Patient left: in chair;with call bell/phone within reach;with nursing/sitter in room;Other (  comment) (Pt's nurse in room, assessing IV site) Nurse Communication: Mobility status;Other (comment) (pt had gotten OOB on his own) PT Visit Diagnosis: Unsteadiness on feet (R26.81);Other abnormalities of gait and mobility (R26.89);Muscle weakness (generalized) (M62.81)    Time: 1023-1040 PT Time Calculation (min) (ACUTE ONLY): 17 min   Charges:   PT Evaluation $PT Eval Moderate Complexity: 1 Mod   PT General Charges $$ ACUTE PT VISIT: 1 Visit         Van Clines, PT  Acute Rehabilitation Services Office [phone] Secure Chat welcomed   Levi Aland 04/15/2023, 11:41 AM

## 2023-04-15 NOTE — Evaluation (Signed)
Occupational Therapy Evaluation Patient Details Name: Anthony Ball MRN: [id number] DOB: [birth year] Today's Date: 04/15/2023   History of Present Illness Anthony Ball is a 89 y.o. male  patient to hospital with shortness of breath and productive cough; influenza a with pneumonia, partial SBO with history of COPD, dementia, hypothyroidism   Clinical Impression   Pt questionable historian, per chart review lives alone. Pt currently needing up to mod A for ADLs, min A for bed mobility and minA for transfers with 1 person HHA. Pt oriented to self and place, follows commands inconsistently throughout session. Pt presenting with impairments listed below, will follow acutely. Patient will benefit from continued inpatient follow up therapy, <3 hours/day to maximize safety/ind with ADL/functional mobility.        If plan is discharge home, recommend the following: A little help with walking and/or transfers;A lot of help with bathing/dressing/bathroom;Assistance with cooking/housework;Direct supervision/assist for medications management;Direct supervision/assist for financial management;Help with stairs or ramp for entrance;Assist for transportation;Supervision due to cognitive status    Functional Status Assessment  Patient has had a recent decline in their functional status and demonstrates the ability to make significant improvements in function in a reasonable and predictable amount of time.  Equipment Recommendations  Other (comment) (defer)    Recommendations for Other Services PT consult     Precautions / Restrictions Precautions Precautions: Fall Precaution Comments: watch O2 sats on room air Restrictions Weight Bearing Restrictions Per Provider Order: No Other Position/Activity Restrictions: watch O2 sats on room air      Mobility Bed Mobility Overal bed mobility: Needs Assistance Bed Mobility: Supine to Sit, Sit to Supine     Supine to sit: Min assist Sit to supine: Min  assist        Transfers Overall transfer level: Needs assistance Equipment used: 1 person hand held assist Transfers: Sit to/from Stand, Bed to chair/wheelchair/BSC Sit to Stand: Min assist                  Balance                                           ADL either performed or assessed with clinical judgement   ADL Overall ADL's : Needs assistance/impaired Eating/Feeding: Set up   Grooming: Sitting;Minimal assistance   Upper Body Bathing: Moderate assistance   Lower Body Bathing: Moderate assistance   Upper Body Dressing : Moderate assistance   Lower Body Dressing: Moderate assistance   Toilet Transfer: Minimal assistance;Ambulation   Toileting- Clothing Manipulation and Hygiene: Supervision/safety       Functional mobility during ADLs: Minimal assistance       Vision   Vision Assessment?: No apparent visual deficits     Perception Perception: Not tested       Praxis Praxis: Not tested       Pertinent Vitals/Pain Pain Assessment Pain Assessment: No/denies pain     Extremity/Trunk Assessment Upper Extremity Assessment Upper Extremity Assessment: Generalized weakness   Lower Extremity Assessment Lower Extremity Assessment: Defer to PT evaluation   Cervical / Trunk Assessment Cervical / Trunk Assessment: Normal   Communication Communication Communication: Hearing impairment Cueing Techniques: Verbal cues;Gestural cues   Cognition Arousal: Alert Behavior During Therapy: WFL for tasks assessed/performed, Impulsive Overall Cognitive Status: No family/caregiver present to determine baseline cognitive functioning Area of Impairment: Orientation, Memory, Safety/judgement, Following commands  Orientation Level: Person, Place   Memory: Decreased short-term memory Following Commands: Follows one step commands inconsistently Safety/Judgement: Decreased awareness of safety, Decreased awareness of  deficits     General Comments: States he "sees signs about [place]" when asked where he is, [place]     General Comments  VSS on 3L O2 durings ession    Exercises     Shoulder Instructions      Home Living Family/patient expects to be discharged to:: Private residence Living Arrangements: Alone                               Additional Comments: Unclear historian, and family has been difficult to contact      Prior Functioning/Environment Prior Level of Function : Patient poor historian/Family not available             Mobility Comments: Given pt has been living alone, can infer that simple mobility was without terrible difficulty          OT Problem List: Decreased strength;Decreased range of motion;Decreased activity tolerance;Impaired balance (sitting and/or standing);Decreased safety awareness      OT Treatment/Interventions: Self-care/ADL training;Therapeutic exercise;Energy conservation;DME and/or AE instruction;Therapeutic activities;Patient/family education;Balance training    OT Goals(Current goals can be found in the care plan section) Acute Rehab OT Goals Patient Stated Goal: none stated OT Goal Formulation: With patient Time For Goal Achievement: 04/29/23 Potential to Achieve Goals: Good ADL Goals Pt Will Perform Upper Body Dressing: with contact guard assist;sitting Pt Will Perform Lower Body Dressing: with contact guard assist;sit to/from stand;sitting/lateral leans Pt Will Transfer to Toilet: with contact guard assist;ambulating;regular height toilet Additional ADL Goal #1: pt will follow 2 step command with min cues in prep for ADLs  OT Frequency: Min 1X/week    Co-evaluation              AM-PAC OT "6 Clicks" Daily Activity     Outcome Measure Help from another person eating meals?: A Little Help from another person taking care of personal grooming?: A Little Help from another person toileting, which  includes using toliet, bedpan, or urinal?: A Lot Help from another person bathing (including washing, rinsing, drying)?: A Lot Help from another person to put on and taking off regular upper body clothing?: A Lot Help from another person to put on and taking off regular lower body clothing?: A Lot 6 Click Score: 14   End of Session Equipment Utilized During Treatment: Gait belt Nurse Communication: Mobility status  Activity Tolerance: Patient tolerated treatment well Patient left: in bed;with bed alarm set;with call bell/phone within reach;with restraints reapplied (bil mitts, sitter in room)  OT Visit Diagnosis: Unsteadiness on feet (R26.81);Other abnormalities of gait and mobility (R26.89);Muscle weakness (generalized) (M62.81)                Time: 1610-9604 OT Time Calculation (min): 17 min Charges:  OT General Charges $OT Visit: 1 Visit OT Evaluation $OT Eval Moderate Complexity: 1 Mod  Ivon Oelkers K, OTD, OTR/L SecureChat Preferred Acute Rehab [phone]  Carver Fila Koonce 04/15/2023, 12:25 PM

## 2023-04-16 ENCOUNTER — Inpatient Hospital Stay (HOSPITAL_COMMUNITY): Payer: Medicare Other

## 2023-04-16 DIAGNOSIS — J189 Pneumonia, unspecified organism: Secondary | ICD-10-CM | POA: Diagnosis not present

## 2023-04-16 MED ORDER — DIATRIZOATE MEGLUMINE & SODIUM 66-10 % PO SOLN
90.0000 mL | Freq: Once | ORAL | Status: AC
  Administered 2023-04-16: 90 mL via ORAL
  Filled 2023-04-16: qty 90

## 2023-04-16 MED ORDER — POTASSIUM CHLORIDE 10 MEQ/100ML IV SOLN
10.0000 meq | INTRAVENOUS | Status: AC
  Administered 2023-04-16 (×4): 10 meq via INTRAVENOUS
  Filled 2023-04-16 (×4): qty 100

## 2023-04-16 MED ORDER — DEXTROSE IN LACTATED RINGERS 5 % IV SOLN: INTRAVENOUS | Status: DC

## 2023-04-16 NOTE — Plan of Care (Signed)

## 2023-04-16 NOTE — NC FL2 (Signed)
[place] MEDICAID FL2 LEVEL OF CARE FORM     IDENTIFICATION  Patient Name: Anthony Ball Birthdate: 1934-04-04 Sex: male Admission Date (Current Location): 04/12/2023  [facility] and [place] Number:  Producer, television/film/video and Address:  [facility], [address] [place], [place] [address]      Provider Number: [id number]  Attending Physician Name and Address:  Joycelyn Das, MD  Relative Name and Phone Number:  [name] (Sister)  [phone]    Current Level of Care: Hospital Recommended Level of Care: Skilled Nursing Facility Prior Approval Number:    Date Approved/Denied:   PASRR Number: [id number]  Discharge Plan: SNF    Current Diagnoses: Patient Active Problem List   Diagnosis Date Noted   SBO (small bowel obstruction) (HCC) 04/13/2023   Ureteral stone 04/13/2023   COPD (chronic obstructive pulmonary disease) (HCC) 04/13/2023   Hypothyroidism 04/13/2023   Dementia (HCC) 04/13/2023   GERD (gastroesophageal reflux disease) 04/13/2023   CAP (community acquired pneumonia) 04/12/2023    Orientation RESPIRATION BLADDER Height & Weight     Self  O2 Incontinent Weight: 123 lb 14.4 oz (56.2 kg) Height:  5\' 6"  (167.6 cm)  BEHAVIORAL SYMPTOMS/MOOD NEUROLOGICAL BOWEL NUTRITION STATUS      Continent Diet (see DC summary)  AMBULATORY STATUS COMMUNICATION OF NEEDS Skin   Extensive Assist Verbally Normal                       Personal Care Assistance Level of Assistance  Bathing, Feeding, Dressing Bathing Assistance: Maximum assistance Feeding assistance: Limited assistance Dressing Assistance: Maximum assistance     Functional Limitations Info  Sight, Hearing, Speech Sight Info: Adequate Hearing Info: Impaired Speech Info: Adequate    SPECIAL CARE FACTORS FREQUENCY  PT (By licensed PT), OT (By licensed OT)     PT Frequency: 5x/week OT Frequency: 5x/week            Contractures Contractures Info: Not present     Additional Factors Info  Code Status, Allergies (Droplet precautions) Code Status Info: FULL Allergies Info: No Known Allergies           Current Medications (04/16/2023):  This is the current hospital active medication list Current Facility-Administered Medications  Medication Dose Route Frequency Provider Last Rate Last Admin   acetaminophen (TYLENOL) tablet 650 mg  650 mg Oral Q6H PRN Eduard Clos, MD       Or   acetaminophen (TYLENOL) suppository 650 mg  650 mg Rectal Q6H PRN Eduard Clos, MD       azithromycin (ZITHROMAX) 500 mg in sodium chloride 0.9 % 250 mL IVPB  500 mg Intravenous Q24H Eduard Clos, MD 250 mL/hr at 04/16/23 0119 500 mg at 04/16/23 0119   cefTRIAXone (ROCEPHIN) 2 g in sodium chloride 0.9 % 100 mL IVPB  2 g Intravenous Q24H Eduard Clos, MD 200 mL/hr at 04/16/23 0021 2 g at 04/16/23 0021   dextrose 5 % in lactated ringers infusion   Intravenous Continuous Pokhrel, Laxman, MD 75 mL/hr at 04/16/23 1144 New Bag at 04/16/23 1144   doxazosin (CARDURA) tablet 1 mg  1 mg Oral Daily McKenzie, Mardene Celeste, MD   1 mg at 04/16/23 1035   haloperidol lactate (HALDOL) injection 2 mg  2 mg Intravenous Q6H PRN Pokhrel, Laxman, MD   2 mg at 04/14/23 1536   ipratropium-albuterol (DUONEB) 0.5-2.5 (3) MG/3ML nebulizer solution 3 mL  3 mL Nebulization Q2H PRN Midge Minium  N, MD       levothyroxine (SYNTHROID) tablet 25 mcg  25 mcg Oral QAC breakfast Pokhrel, Laxman, MD   25 mcg at 04/16/23 0629   memantine (NAMENDA) tablet 10 mg  10 mg Oral Daily Pokhrel, Laxman, MD   10 mg at 04/16/23 1035   ondansetron (ZOFRAN) tablet 4 mg  4 mg Oral Q6H PRN Eduard Clos, MD       Or   ondansetron [facility]) injection 4 mg  4 mg Intravenous Q6H PRN Eduard Clos, MD       QUEtiapine (SEROQUEL) tablet 25 mg  25 mg Oral BID Pokhrel, Laxman, MD   25 mg at 04/16/23 1035     Discharge Medications: Please see discharge summary for a list of discharge  medications.  Relevant Imaging Results:  Relevant Lab Results:   Additional Information ZOX:[id number]  Afsana Liera A [place], [place]

## 2023-04-16 NOTE — Progress Notes (Signed)
PROGRESS NOTE  Anthony Ball ZOX:[id number] DOB: [birth year] DOA: 04/12/2023 PCP: Georgann Housekeeper, MD   LOS: 3 days   Brief narrative:  Anthony Ball is a 89 y.o. male with history of COPD, dementia, hypothyroidism patient to hospital with shortness of breath and productive cough.  Poor historian.  Of note patient was recently seen by cardiology and had a stress test done on 04/03/2023 which was unremarkable.  In the ED, patient was slightly tachycardic and tachypneic.  Was on 2 L of  2 L of oxygen.  Initial labs showed leukocytosis with WBC of 19.4.  Lactate was elevated at 2.9.  BNP of 45.  Patient tested positive for influenza A.  WBC was significantly elevated at 22.5.  Chest x-ray showed infiltrates.  During ED stay patient also had multiple episodes of vomiting and had distention of the abdomen.  CT scan of the abdomen pelvis showed features concerning for bowel obstruction and chest CT showed pneumonia.  He also had right ureteric stone.  Patient was then admitted hospital for further evaluation and treatment.   Assessment/Plan:  Principal Problem:   CAP (community acquired pneumonia) Active Problems:   SBO (small bowel obstruction) (HCC)   Ureteral stone   COPD (chronic obstructive pulmonary disease) (HCC)   Hypothyroidism   Dementia (HCC)   GERD (gastroesophageal reflux disease)  Possible partial small bowel obstruction -   as per CT scan of the abdomen.  Patient had abdominal distention nausea vomiting on presentation.  Was initially NG tube which has been discontinued by the patient.  General surgery following.  No further symptoms at this time.  Procalcitonin 0.4.  Continue IV hydration with D5 Ringer lactate.  Analgesics, antiemetics as necessary.  Possibility of ileus.  Abdominal x-ray with slight improvement in dilated air-filled loops of small bowel.  Follow surgical recommendations.   Pneumonia with influenza A positive, leukocytosis trending down at 13.5.  Blood cultures  negative in 3 days..  On empiric antibiotic with Rocephin and Zithromax.   Right UPJ stone with right-sided hydronephrosis.  Seen by urology.  Blood cultures negative in 3 days.  .  Urine culture no growth so far.  Urinalysis with no white cells.  Urology recommends Flomax if p.o. okay.  Has been on doxazosin since it can be crushed.  Able to urinate.  Latest creatinine of 0.9.   COPD continue bronchodilators with nebulizers.  Appears to be compensated.   Diffuse esophageal thickening seen on the CAT scan will eventually need EGD.   Dementia takes Namenda at home.  Clinically n.p.o. with meds, has been started on Namenda.   Hypothyroidism continue Synthroid    DVT prophylaxis: SCDs Start: 04/13/23 0540   Disposition: Physical therapy has recommended skilled nursing facility placement.  Status is: Inpatient  Remains inpatient appropriate because: Pending clinical improvement, need for placement.    Code Status:     Code Status: Full Code  Family Communication:   Spoke with the patient's sister Ms Johnny Bridge on the phone and spoke with her today.  She wishes me to talk to Ms. Linda blue on the phone but was unable to reach out to her.    Consultants: Urology General surgery  Procedures: NG tube insertion and  removal  Anti-infectives:  Rocephin and Zithromax IV  Anti-infectives (From admission, onward)    Start     Dose/Rate Route Frequency Ordered Stop   04/14/23 0000  azithromycin (ZITHROMAX) 500 mg in sodium chloride 0.9 % 250 mL IVPB  500 mg 250 mL/hr over 60 Minutes Intravenous Every 24 hours 04/13/23 0541 04/18/23 2359   04/13/23 2200  cefTRIAXone (ROCEPHIN) 2 g in sodium chloride 0.9 % 100 mL IVPB        2 g 200 mL/hr over 30 Minutes Intravenous Every 24 hours 04/13/23 0541 04/18/23 2159   04/12/23 2345  cefTRIAXone (ROCEPHIN) 1 g in sodium chloride 0.9 % 100 mL IVPB        1 g 200 mL/hr over 30 Minutes Intravenous  Once 04/12/23 2337 04/13/23 0034    04/12/23 2345  azithromycin (ZITHROMAX) 500 mg in sodium chloride 0.9 % 250 mL IVPB        500 mg 250 mL/hr over 60 Minutes Intravenous  Once 04/12/23 2337 04/13/23 0102       Subjective: Today, patient was seen and examined at bedside.  Patient appears to be less agitated and had some sleep yesterday.  Was started on Seroquel.  Still mildly agitated.   Objective: Vitals:   04/16/23 0604 04/16/23 0734  BP: 136/77 136/85  Pulse: 74 69  Resp: 19 17  Temp: 98 F (36.7 C) 98.2 F (36.8 C)  SpO2: 91% 100%    Intake/Output Summary (Last 24 hours) at 04/16/2023 1032 Last data filed at 04/16/2023 0119 Gross per 24 hour  Intake 500 ml  Output --  Net 500 ml   Filed Weights   04/12/23 1757 04/13/23 1725  Weight: 61 kg 56.2 kg   Body mass index is 20 kg/m.   Physical Exam:  GENERAL: Patient is alert awake but confused and oriented to self only, elderly male, appears chronically ill, on nasal cannula oxygen, bilateral mittens in place HENT: No scleral pallor or icterus. Pupils equally reactive to light. Oral mucosa is moist NECK: is supple, no gross swelling noted. CHEST: Decreased breath sounds bilaterally,  CVS: S1 and S2 heard, no murmur. Regular rate and rhythm.  ABDOMEN: Soft, non-tender, bowel sounds are present. EXTREMITIES: No edema. CNS: Cranial nerves are intact.  Moves all extremities.  Bilateral mittens in place SKIN: warm and dry without rashes.  Data Review: I have personally reviewed the following laboratory data and studies,  CBC: Recent Labs  Lab 04/12/23 1850 04/12/23 1900 04/13/23 0611 04/14/23 0945 04/15/23 0855  WBC 19.4*  --  22.5* 18.4* 13.5*  NEUTROABS 16.0*  --  21.3*  --   --   HGB 16.0 16.7 15.0 13.4 13.0  HCT 47.8 49.0 44.3 40.8 39.9  MCV 87.1  --  86.2 87.2 87.7  PLT 322  --  320 332 378   Basic Metabolic Panel: Recent Labs  Lab 04/12/23 1850 04/12/23 1900 04/13/23 0611 04/14/23 0945 04/15/23 0855  NA 136 135 138 144 146*  K 3.8  3.7 3.7 3.3* 3.3*  CL 99  --  99 105 110  CO2 24  --  22 26 26   GLUCOSE 125*  --  164* 109* 116*  BUN 30*  --  29* 29* 22  CREATININE 1.12  --  1.01 1.15 0.96  CALCIUM 9.1  --  9.3 9.1 8.6*  MG  --   --   --  2.3  --   PHOS  --   --   --  2.3*  --    Liver Function Tests: Recent Labs  Lab 04/12/23 1850 04/13/23 0611  AST 28 45*  ALT 14 18  ALKPHOS 71 70  BILITOT 0.9 0.8  PROT 6.6 6.5  ALBUMIN 3.2* 2.9*   No  results for input(s): "LIPASE", "AMYLASE" in the last 168 hours. No results for input(s): "AMMONIA" in the last 168 hours. Cardiac Enzymes: No results for input(s): "CKTOTAL", "CKMB", "CKMBINDEX", "TROPONINI" in the last 168 hours. BNP (last 3 results) Recent Labs    04/12/23 1850  BNP 45.9    ProBNP (last 3 results) No results for input(s): "PROBNP" in the last 8760 hours.  CBG: Recent Labs  Lab 04/14/23 0610 04/14/23 0809 04/14/23 1229 04/15/23 0015 04/15/23 0647  GLUCAP 103* 105* 102* 94 102*   Recent Results (from the past 240 hours)  Resp panel by RT-PCR (RSV, Flu A&B, Covid) Anterior Nasal Swab     Status: Abnormal   Collection Time: 04/12/23  6:57 PM   Specimen: Anterior Nasal Swab  Result Value Ref Range Status   SARS Coronavirus 2 by RT PCR NEGATIVE NEGATIVE Final   Influenza A by PCR POSITIVE (A) NEGATIVE Final   Influenza B by PCR NEGATIVE NEGATIVE Final    Comment: (NOTE) The Xpert Xpress SARS-CoV-2/FLU/RSV plus assay is intended as an aid in the diagnosis of influenza from Nasopharyngeal swab specimens and should not be used as a sole basis for treatment. Nasal washings and aspirates are unacceptable for Xpert Xpress SARS-CoV-2/FLU/RSV testing.  Fact Sheet for Patients: BloggerCourse.com  Fact Sheet for Healthcare Providers: SeriousBroker.it  This test is not yet approved or cleared by the [place] FDA and has been authorized for detection and/or diagnosis of SARS-CoV-2 by FDA  under an Emergency Use Authorization (EUA). This EUA will remain in effect (meaning this test can be used) for the duration of the COVID-19 declaration under Section 564(b)(1) of the Act, 21 U.S.C. section 360bbb-3(b)(1), unless the authorization is terminated or revoked.     Resp Syncytial Virus by PCR NEGATIVE NEGATIVE Final    Comment: (NOTE) Fact Sheet for Patients: BloggerCourse.com  Fact Sheet for Healthcare Providers: SeriousBroker.it  This test is not yet approved or cleared by the [place] FDA and has been authorized for detection and/or diagnosis of SARS-CoV-2 by FDA under an Emergency Use Authorization (EUA). This EUA will remain in effect (meaning this test can be used) for the duration of the COVID-19 declaration under Section 564(b)(1) of the Act, 21 U.S.C. section 360bbb-3(b)(1), unless the authorization is terminated or revoked.  Performed at [facility], [address] [place], [place] [address]   Culture, blood (routine x 2)     Status: None (Preliminary result)   Collection Time: 04/12/23 11:37 PM   Specimen: BLOOD  Result Value Ref Range Status   Specimen Description BLOOD SITE NOT SPECIFIED  Final   Special Requests   Final    BOTTLES DRAWN AEROBIC AND ANAEROBIC Blood Culture adequate volume   Culture   Final    NO GROWTH 3 DAYS Performed at [facility], [address], [place], [place] [address]    Report Status PENDING  Incomplete  Culture, blood (routine x 2)     Status: None (Preliminary result)   Collection Time: 04/12/23 11:58 PM   Specimen: BLOOD RIGHT ARM  Result Value Ref Range Status   Specimen Description BLOOD RIGHT ARM  Final   Special Requests   Final    BOTTLES DRAWN AEROBIC AND ANAEROBIC Blood Culture adequate volume   Culture   Final    NO GROWTH 3 DAYS Performed at [facility], [address] [place], [place] [address]    Report Status PENDING   Incomplete  Urine Culture (  for pregnant, neutropenic or urologic patients or patients with an indwelling urinary catheter)     Status: None   Collection Time: 04/13/23  8:26 AM   Specimen: Urine, Clean Catch  Result Value Ref Range Status   Specimen Description URINE, CLEAN CATCH  Final   Special Requests NONE  Final   Culture   Final    NO GROWTH Performed at [facility], [address] [place], [place] [address]    Report Status 04/14/2023 FINAL  Final     Studies: DG Abd Portable 1V Result Date: 04/16/2023 CLINICAL DATA:  Ileus EXAM: PORTABLE ABDOMEN - 1 VIEW COMPARISON:  X-ray 04/15/2023 and older FINDINGS: Persistent distension of air-filled loops of small bowel. The loop previously in left upper quadrant measuring 4.5 cm in diameter today measures 4.2 cm. Other loops are slightly less dilated with some residual. There is air in nondilated colon diffusely with scattered stool. Air and stool towards the rectum. No obstruction. No definite free air. Curvature and degenerative changes along the spine. Overlapping cardiac leads. IMPRESSION: Slight improvement in dilated air-filled loops of small bowel with significant residual. Recommend continued surveillance. Electronically Signed   By: Karen Kays M.D.   On: 04/16/2023 10:06   DG Abd Portable 1V Result Date: 04/15/2023 CLINICAL DATA:  Small bowel obstruction. EXAM: PORTABLE ABDOMEN - 1 VIEW COMPARISON:  04/14/2023 FINDINGS: General decrease in diffuse gaseous distension although dilated gas-filled small bowel the left upper quadrant still measures up to 4.5 cm diameter. There is some gas visible in the nondilated left colon on the current study. Convex rightward lumbar scoliosis evident. IMPRESSION: General decrease in diffuse gaseous bowel distension although dilated gas-filled small bowel in the left upper quadrant still measures up to 4.5 cm diameter. Electronically Signed   By: Kennith Center M.D.   On: 04/15/2023 11:21       Joycelyn Das, MD  [facility] 04/16/2023  If 7PM-7AM, please contact night-coverage

## 2023-04-16 NOTE — TOC Progression Note (Signed)
Transition of Care [facility]) - Progression Note    Patient Details  Name: Anthony Ball MRN: [id number] Date of Birth: July 08, 1934  [facility]) CM/SW Contact  Yassen Kinnett A [place], [place] Phone Number: 04/16/2023, 4:46 PM  Clinical Narrative:     CSW received call back from pt's sister Bonita Quin, she stated that she was agreeable for pt to go to SNF, preference for [employer]. CSW completed SNF workup, bed offers pending.    TOC will continue to follow.   Expected Discharge Plan: Skilled Nursing Facility Barriers to Discharge: Insurance Authorization, SNF Pending bed offer, Continued Medical Work up  Expected Discharge Plan and Services In-house Referral: Clinical Social Work     Living arrangements for the past 2 months: Single Family Home                                       Social Determinants of Health (SDOH) Interventions SDOH Screenings   Food Insecurity: No Food Insecurity (04/13/2023)  Housing: Low Risk  (04/13/2023)  Transportation Needs: No Transportation Needs (04/13/2023)  Utilities: Not At Risk (04/13/2023)  Social Connections: Unknown (04/13/2023)  Tobacco Use: Unknown (04/13/2023)    Readmission Risk Interventions     No data to display

## 2023-04-16 NOTE — Progress Notes (Signed)
Progress Note     Subjective: Pt pleasant and denies abdominal pain or nausea. Sitting in bed with mittens on. Does feel like he may be a little bloated. Unsure if passing flatus.   Objective: Vital signs in last 24 hours: Temp:  [97.6 F (36.4 C)-98.4 F (36.9 C)] 98.2 F (36.8 C) (01/27 0734) Pulse Rate:  [64-79] 69 (01/27 0734) Resp:  [17-19] 17 (01/27 0734) BP: (120-136)/(60-85) 136/85 (01/27 0734) SpO2:  [89 %-100 %] 100 % (01/27 0734)    Intake/Output from previous day: 01/26 0701 - 01/27 0700 In: 500 [P.O.:50; IV Piggyback:450] Out: -  Intake/Output this shift: No intake/output data recorded.  PE: General: pleasant, WD, elderly male who is laying in bed in NAD Heart: regular, rate, and rhythm.   Lungs:  Respiratory effort nonlabored on nasal cannula  Abd: soft, NT, moderate distention     Lab Results:  Recent Labs    04/14/23 0945 04/15/23 0855  WBC 18.4* 13.5*  HGB 13.4 13.0  HCT 40.8 39.9  PLT 332 378   BMET Recent Labs    04/14/23 0945 04/15/23 0855  NA 144 146*  K 3.3* 3.3*  CL 105 110  CO2 26 26  GLUCOSE 109* 116*  BUN 29* 22  CREATININE 1.15 0.96  CALCIUM 9.1 8.6*   PT/INR No results for input(s): "LABPROT", "INR" in the last 72 hours. CMP     Component Value Date/Time   NA 146 (H) 04/15/2023 0855   K 3.3 (L) 04/15/2023 0855   CL 110 04/15/2023 0855   CO2 26 04/15/2023 0855   GLUCOSE 116 (H) 04/15/2023 0855   BUN 22 04/15/2023 0855   CREATININE 0.96 04/15/2023 0855   CALCIUM 8.6 (L) 04/15/2023 0855   PROT 6.5 04/13/2023 0611   ALBUMIN 2.9 (L) 04/13/2023 0611   AST 45 (H) 04/13/2023 0611   ALT 18 04/13/2023 0611   ALKPHOS 70 04/13/2023 0611   BILITOT 0.8 04/13/2023 0611   GFRNONAA >60 04/15/2023 0855   GFRAA >90 04/13/2012 1157   Lipase  No results found for: "LIPASE"     Studies/Results: DG Abd Portable 1V Result Date: 04/16/2023 CLINICAL DATA:  Ileus EXAM: PORTABLE ABDOMEN - 1 VIEW COMPARISON:  X-ray 04/15/2023  and older FINDINGS: Persistent distension of air-filled loops of small bowel. The loop previously in left upper quadrant measuring 4.5 cm in diameter today measures 4.2 cm. Other loops are slightly less dilated with some residual. There is air in nondilated colon diffusely with scattered stool. Air and stool towards the rectum. No obstruction. No definite free air. Curvature and degenerative changes along the spine. Overlapping cardiac leads. IMPRESSION: Slight improvement in dilated air-filled loops of small bowel with significant residual. Recommend continued surveillance. Electronically Signed   By: Karen Kays M.D.   On: 04/16/2023 10:06   DG Abd Portable 1V Result Date: 04/15/2023 CLINICAL DATA:  Small bowel obstruction. EXAM: PORTABLE ABDOMEN - 1 VIEW COMPARISON:  04/14/2023 FINDINGS: General decrease in diffuse gaseous distension although dilated gas-filled small bowel the left upper quadrant still measures up to 4.5 cm diameter. There is some gas visible in the nondilated left colon on the current study. Convex rightward lumbar scoliosis evident. IMPRESSION: General decrease in diffuse gaseous bowel distension although dilated gas-filled small bowel in the left upper quadrant still measures up to 4.5 cm diameter. Electronically Signed   By: Kennith Center M.D.   On: 04/15/2023 11:21    Anti-infectives: Anti-infectives (From admission, onward)    Start  Dose/Rate Route Frequency Ordered Stop   04/14/23 0000  azithromycin (ZITHROMAX) 500 mg in sodium chloride 0.9 % 250 mL IVPB        500 mg 250 mL/hr over 60 Minutes Intravenous Every 24 hours 04/13/23 0541 04/18/23 2359   04/13/23 2200  cefTRIAXone (ROCEPHIN) 2 g in sodium chloride 0.9 % 100 mL IVPB        2 g 200 mL/hr over 30 Minutes Intravenous Every 24 hours 04/13/23 0541 04/18/23 2159   04/12/23 2345  cefTRIAXone (ROCEPHIN) 1 g in sodium chloride 0.9 % 100 mL IVPB        1 g 200 mL/hr over 30 Minutes Intravenous  Once 04/12/23 2337  04/13/23 0034   04/12/23 2345  azithromycin (ZITHROMAX) 500 mg in sodium chloride 0.9 % 250 mL IVPB        500 mg 250 mL/hr over 60 Minutes Intravenous  Once 04/12/23 2337 04/13/23 0102        Assessment/Plan  PSBO v ileus  - CT 1/24 with diffuse small bowel dilatation except some segments in RLQ, distal SBO vs ileus, small volume perihepatic and pelvic ascites - has removed multiple NGT - film this AM with slight improvement in dilated small bowel  - abdominal exam with some distention but otherwise seemingly benign - patient has not actually had gastrografin trial - would like to attempt this PO today, if unable to tolerate would recommend SLP consult, NGT placement if vomiting  - no indication for emergent surgical intervention  - mobilize as able  - keep K> 4.0, Mg >2.0 to optimize bowel function   FEN: NPO, IVF per primary  VTE: SCDs, ok to have LMWH or SQH from surgery standpoint ID: azithromycin/rocephin  - per TRH -  Right UPJ stone CAP - influenza A positive  COPD Dementia Esophageal wall thickening  Hypothyroidism   LOS: 3 days   I reviewed hospitalist notes, last 24 h vitals and pain scores, last 48 h intake and output, last 24 h labs and trends, and last 24 h imaging results.  This care required moderate level of medical decision making.    Juliet Rude, [facility] 04/16/2023, 11:46 AM Please see Amion for pager number during day hours 7:00am-4:30pm

## 2023-04-16 NOTE — Plan of Care (Signed)
Problem: Education: Goal: Knowledge of General Education information will improve Description: Including pain rating scale, medication(s)/side effects and non-pharmacologic comfort measures Outcome: Progressing   Problem: Health Behavior/Discharge Planning: Goal: Ability to manage health-related needs will improve Outcome: Progressing   Problem: Clinical Measurements: Goal: Ability to maintain clinical measurements within normal limits will improve Outcome: Progressing Goal: Will remain free from infection Outcome: Progressing Goal: Diagnostic test results will improve Outcome: Progressing Goal: Respiratory complications will improve Outcome: Progressing Goal: Cardiovascular complication will be avoided Outcome: Progressing   Problem: Activity: Goal: Risk for activity intolerance will decrease Outcome: Progressing   Problem: Nutrition: Goal: Adequate nutrition will be maintained Outcome: Progressing   Problem: Coping: Goal: Level of anxiety will decrease Outcome: Progressing   Problem: Elimination: Goal: Will not experience complications related to bowel motility Outcome: Progressing Goal: Will not experience complications related to urinary retention Outcome: Progressing   Problem: Pain Managment: Goal: General experience of comfort will improve and/or be controlled Outcome: Progressing   Problem: Safety: Goal: Ability to remain free from injury will improve Outcome: Progressing   Problem: Skin Integrity: Goal: Risk for impaired skin integrity will decrease Outcome: Progressing   Problem: Activity: Goal: Ability to tolerate increased activity will improve Outcome: Progressing   Problem: Clinical Measurements: Goal: Ability to maintain a body temperature in the normal range will improve Outcome: Progressing   Problem: Respiratory: Goal: Ability to maintain adequate ventilation will improve Outcome: Progressing Goal: Ability to maintain a clear airway  will improve Outcome: Progressing   Problem: Safety: Goal: Non-violent Restraint(s) Outcome: Progressing

## 2023-04-16 NOTE — Plan of Care (Signed)
Pt confused and consistently removing tele. Ok to [employer] tele per attending

## 2023-04-16 NOTE — Progress Notes (Signed)
Chaplain went to follow up on HCPOA. Patient is unable to process AD at this time. No family present at bedside. Will need new consult if patient's condition improves.

## 2023-04-16 NOTE — Progress Notes (Addendum)
Patient ID: Anthony Ball, male   DOB: [birth year], 89 y.o.   MRN: [id number]    Subjective: Pt denies flank pain.  Objective: Vital signs in last 24 hours: Temp:  [97.6 F (36.4 C)-98.4 F (36.9 C)] 98.2 F (36.8 C) (01/27 0734) Pulse Rate:  [64-79] 69 (01/27 0734) Resp:  [17-19] 17 (01/27 0734) BP: (120-136)/(60-85) 136/85 (01/27 0734) SpO2:  [89 %-100 %] 100 % (01/27 0734)  Intake/Output from previous day: 01/26 0701 - 01/27 0700 In: 500 [P.O.:50; IV Piggyback:450] Out: -  Intake/Output this shift: No intake/output data recorded.  Physical Exam:  General: Pt confused and unable to answer questions  Lab Results: Recent Labs    04/14/23 0945 04/15/23 0855  HGB 13.4 13.0  HCT 40.8 39.9      Latest Ref Rng & Units 04/15/2023    8:55 AM 04/14/2023    9:45 AM 04/13/2023    6:11 AM  CBC  WBC 4.0 - 10.5 K/uL 13.5  18.4  22.5   Hemoglobin 13.0 - 17.0 g/dL 62.1  30.8  65.7   Hematocrit 39.0 - 52.0 % 39.9  40.8  44.3   Platelets 150 - 400 K/uL 378  332  320      BMET Recent Labs    04/14/23 0945 04/15/23 0855  NA 144 146*  K 3.3* 3.3*  CL 105 110  CO2 26 26  GLUCOSE 109* 116*  BUN 29* 22  CREATININE 1.15 0.96  CALCIUM 9.1 8.6*     Studies/Results: DG Abd Portable 1V Result Date: 04/16/2023 CLINICAL DATA:  Ileus EXAM: PORTABLE ABDOMEN - 1 VIEW COMPARISON:  X-ray 04/15/2023 and older FINDINGS: Persistent distension of air-filled loops of small bowel. The loop previously in left upper quadrant measuring 4.5 cm in diameter today measures 4.2 cm. Other loops are slightly less dilated with some residual. There is air in nondilated colon diffusely with scattered stool. Air and stool towards the rectum. No obstruction. No definite free air. Curvature and degenerative changes along the spine. Overlapping cardiac leads. IMPRESSION: Slight improvement in dilated air-filled loops of small bowel with significant residual. Recommend continued surveillance. Electronically Signed    By: Karen Kays M.D.   On: 04/16/2023 10:06   DG Abd Portable 1V Result Date: 04/15/2023 CLINICAL DATA:  Small bowel obstruction. EXAM: PORTABLE ABDOMEN - 1 VIEW COMPARISON:  04/14/2023 FINDINGS: General decrease in diffuse gaseous distension although dilated gas-filled small bowel the left upper quadrant still measures up to 4.5 cm diameter. There is some gas visible in the nondilated left colon on the current study. Convex rightward lumbar scoliosis evident. IMPRESSION: General decrease in diffuse gaseous bowel distension although dilated gas-filled small bowel in the left upper quadrant still measures up to 4.5 cm diameter. Electronically Signed   By: Kennith Center M.D.   On: 04/15/2023 11:21    Assessment/Plan: Right UPJ calculus: Asymptomatic and no hydronephrosis on CT from the other day.  Will continue with monitoring stone.  Not very well seen on KUB so may eventually need repeat CT but can consider that as an outpatient if he does not require repeat CT imaging during his hospitalization for other reasons.   LOS: 3 days   Anthony Ball 04/16/2023, 11:41 AM

## 2023-04-16 NOTE — TOC Initial Note (Signed)
Transition of Care [facility]) - Initial/Assessment Note    Patient Details  Name: Anthony Ball MRN: [id number] Date of Birth: 07/22/1934  Transition of Care [facility]) CM/SW Contact:    Lemond Griffee A [place], Theresia Majors Phone Number: 04/16/2023, 12:26 PM  Clinical Narrative:                  CSW met with pt at bedside, pt was somewhat alert and oriented to person, able to speak with CSW however, CSW followed up with pt's sisters as he was still confused and unable to discuss disposition planning. CSW reached out and left VM with both Bonita Quin and [place]. CSW spoke with Martha's husband, said she was unable to assist pt during this admission.   FL2 completed and will be sent out once confirmed agreeable for SNF at discharge.    TOC will continue to follow.   Expected Discharge Plan: Skilled Nursing Facility Barriers to Discharge: Insurance Authorization, SNF Pending bed offer, Continued Medical Work up   Patient Goals and CMS Choice Patient states their goals for this hospitalization and ongoing recovery are:: get better          Expected Discharge Plan and Services In-house Referral: Clinical Social Work     Living arrangements for the past 2 months: Single Family Home                                      Prior Living Arrangements/Services Living arrangements for the past 2 months: Single Family Home Lives with:: Self          Need for Family Participation in Patient Care: Yes (Comment) Care giver support system in place?: Yes (comment) ([name])      Activities of Daily Living   ADL Screening (condition at time of admission) Independently performs ADLs?: Yes (appropriate for developmental age) Is the patient deaf or have difficulty hearing?: No Does the patient have difficulty seeing, even when wearing glasses/contacts?: No Does the patient have difficulty concentrating, remembering, or making decisions?: Yes  Permission Sought/Granted                  Emotional  Assessment Appearance:: Appears older than stated age Attitude/Demeanor/Rapport:  (normal) Affect (typically observed): Pleasant Orientation: : Oriented to Self Alcohol / Substance Use: Not Applicable Psych Involvement: No (comment)  Admission diagnosis:  Small bowel obstruction (HCC) [K56.609] SBO (small bowel obstruction) (HCC) [K56.609] Influenza A [J10.1] CAP (community acquired pneumonia) [J18.9] Community acquired pneumonia of right lower lobe of lung [J18.9] Patient Active Problem List   Diagnosis Date Noted   SBO (small bowel obstruction) (HCC) 04/13/2023   Ureteral stone 04/13/2023   COPD (chronic obstructive pulmonary disease) (HCC) 04/13/2023   Hypothyroidism 04/13/2023   Dementia (HCC) 04/13/2023   GERD (gastroesophageal reflux disease) 04/13/2023   CAP (community acquired pneumonia) 04/12/2023   PCP:  Georgann Housekeeper, MD Pharmacy:   [id number] - [place] - [address] [place] ([place]) [place] [address] Phone: [phone] Fax: [phone]  [id number] [place], [place] - [address] AT [facility] OF [address] [place] [facility] [address] Phone: [phone] Fax: [phone]     Social Drivers of Health (SDOH) Social History: SDOH Screenings   Food Insecurity: No Food Insecurity (04/13/2023)  Housing: Low Risk  (04/13/2023)  Transportation Needs: No Transportation Needs (04/13/2023)  Utilities: Not At Risk (04/13/2023)  Social Connections: Unknown (04/13/2023)  Tobacco Use: Unknown (04/13/2023)   SDOH Interventions:     Readmission Risk Interventions     No data to display

## 2023-04-16 NOTE — Progress Notes (Signed)
Mobility Specialist: Progress Note   04/16/23 1231  Mobility  Activity Ambulated with assistance in room  Level of Assistance Contact guard assist, steadying assist  Assistive Device None;Other (Comment) (IV pole)  Distance Ambulated (ft) 25 ft  Activity Response Tolerated well  Mobility Referral Yes  Mobility visit 1 Mobility  Mobility Specialist Start Time (ACUTE ONLY) 1012  Mobility Specialist Stop Time (ACUTE ONLY) 1028  Mobility Specialist Time Calculation (min) (ACUTE ONLY) 16 min    Pt not agreeable to further mobility but requested to go to the BR - received in bed. Stated he doesn't have much energy today. Ambulated to BR with IV pole and CG, ambulated back to bed with no AD and CG. Left in bed with all needs met, call bell in reach. Restraints on.   Maurene Capes Mobility Specialist Please contact via SecureChat or Rehab office at [phone]

## 2023-04-17 ENCOUNTER — Telehealth (INDEPENDENT_AMBULATORY_CARE_PROVIDER_SITE_OTHER): Payer: Self-pay | Admitting: Otolaryngology

## 2023-04-17 ENCOUNTER — Inpatient Hospital Stay (HOSPITAL_COMMUNITY): Payer: Medicare Other

## 2023-04-17 DIAGNOSIS — J189 Pneumonia, unspecified organism: Secondary | ICD-10-CM | POA: Diagnosis not present

## 2023-04-17 LAB — CBC
HCT: 38.6 % — ABNORMAL LOW (ref 39.0–52.0)
Hemoglobin: 12.9 g/dL — ABNORMAL LOW (ref 13.0–17.0)
MCH: 29.1 pg (ref 26.0–34.0)
MCHC: 33.4 g/dL (ref 30.0–36.0)
MCV: 87.1 fL (ref 80.0–100.0)
Platelets: 403 10*3/uL — ABNORMAL HIGH (ref 150–400)
RBC: 4.43 MIL/uL (ref 4.22–5.81)
RDW: 13.9 % (ref 11.5–15.5)
WBC: 9.5 10*3/uL (ref 4.0–10.5)
nRBC: 0 % (ref 0.0–0.2)

## 2023-04-17 LAB — BASIC METABOLIC PANEL
Anion gap: 8 (ref 5–15)
BUN: 14 mg/dL (ref 8–23)
CO2: 24 mmol/L (ref 22–32)
Calcium: 8.3 mg/dL — ABNORMAL LOW (ref 8.9–10.3)
Chloride: 109 mmol/L (ref 98–111)
Creatinine, Ser: 0.7 mg/dL (ref 0.61–1.24)
GFR, Estimated: >60 mL/min (ref >60)
Glucose, Bld: 112 mg/dL — ABNORMAL HIGH (ref 70–99)
Potassium: 3.3 mmol/L — ABNORMAL LOW (ref 3.5–5.1)
Sodium: 141 mmol/L (ref 135–145)

## 2023-04-17 LAB — MAGNESIUM: Magnesium: 2.2 mg/dL (ref 1.7–2.4)

## 2023-04-17 MED ORDER — POTASSIUM CHLORIDE 10 MEQ/100ML IV SOLN
10.0000 meq | INTRAVENOUS | Status: AC
  Administered 2023-04-17 (×4): 10 meq via INTRAVENOUS
  Filled 2023-04-17 (×4): qty 100

## 2023-04-17 MED ORDER — POTASSIUM CHLORIDE CRYS ER 20 MEQ PO TBCR
40.0000 meq | EXTENDED_RELEASE_TABLET | Freq: Once | ORAL | Status: AC
  Administered 2023-04-17: 40 meq via ORAL
  Filled 2023-04-17: qty 2

## 2023-04-17 NOTE — Telephone Encounter (Signed)
LVM to confirm appt & location [phone] afm

## 2023-04-17 NOTE — Progress Notes (Signed)
Physical Therapy Treatment Patient Details Name: Anthony Ball MRN: [id number] DOB: 1935/01/11 Today's Date: 04/17/2023   History of Present Illness Anthony Ball is a 89 y.o. male  patient to hospital with shortness of breath and productive cough; influenza a with pneumonia, partial SBO with history of COPD, dementia, hypothyroidism    PT Comments  Pt presents semi-reclined in bed and agreeable to therapy.  Pt transferring to EOB w/ supervision and cues.  Pt transfers impulsively to stand before cued.  Pt amb w/ RW and 60' w/ min A, O2 at 2LPM.  Pt returned to sitting EOB and O2 sats at 97% and HR 82.  Pt agreeable to 2nd gait trial w/o AD.  Pt does lightly use 1 hand on IV pole.  Pt amb x 80' and returned to bed.  O2 sats at 94% and HR of 84bpm.  Pt returned to supine w/ supervision.  Bed alarm on and all needs in reach.    If plan is discharge home, recommend the following: A little help with walking and/or transfers;A lot of help with bathing/dressing/bathroom;Direct supervision/assist for financial management;Assistance with cooking/housework;Assist for transportation;Supervision due to cognitive status;Help with stairs or ramp for entrance   Can travel by private vehicle        Equipment Recommendations       Recommendations for Other Services       Precautions / Restrictions Precautions Precautions: Fall Restrictions Weight Bearing Restrictions Per Provider Order: No     Mobility  Bed Mobility Overal bed mobility: Needs Assistance Bed Mobility: Supine to Sit, Sit to Supine     Supine to sit: Supervision Sit to supine: Supervision   General bed mobility comments: Pt transferring in/OOB w/ supervision only, HOB elevated slightly    Transfers Overall transfer level: Needs assistance Equipment used: Rolling walker (2 wheels) (2nd gait trial w/o AD.)   Sit to Stand: Min assist, Contact guard assist                Ambulation/Gait Ambulation/Gait assistance: Min  assist Gait Distance (Feet): 80 Feet (60' 1st trial) Assistive device: Rolling walker (2 wheels), IV Pole Gait Pattern/deviations: Step-through pattern, Decreased step length - right, Decreased step length - left       General Gait Details: cues required for proper RW management, especially w/ turns to return to bed.   Stairs             Wheelchair Mobility     Tilt Bed    Modified Rankin (Stroke Patients Only)       Balance Overall balance assessment: Needs assistance         Standing balance support: During functional activity Standing balance-Leahy Scale: Fair                              Cognition Arousal: Alert Behavior During Therapy: WFL for tasks assessed/performed, Impulsive (stands before PT available.)                             Safety/Judgement: Decreased awareness of safety, Decreased awareness of deficits              Exercises      General Comments        Pertinent Vitals/Pain Pain Assessment Pain Assessment: No/denies pain Faces Pain Scale: No hurt    Home Living  Prior Function            PT Goals (current goals can now be found in the care plan section) Acute Rehab PT Goals Time For Goal Achievement: 04/29/23 Potential to Achieve Goals: Good Progress towards PT goals: Progressing toward goals    Frequency           PT Plan      Co-evaluation              AM-PAC PT "6 Clicks" Mobility   Outcome Measure  Help needed turning from your back to your side while in a flat bed without using bedrails?: None Help needed moving from lying on your back to sitting on the side of a flat bed without using bedrails?: None Help needed moving to and from a bed to a chair (including a wheelchair)?: A Little Help needed standing up from a chair using your arms (e.g., wheelchair or bedside chair)?: A Little Help needed to walk in hospital room?: A Little Help  needed climbing 3-5 steps with a railing? : A Lot 6 Click Score: 19    End of Session Equipment Utilized During Treatment: Oxygen;Gait belt Activity Tolerance: Patient tolerated treatment well Patient left: in bed;with call bell/phone within reach Nurse Communication: Mobility status PT Visit Diagnosis: Unsteadiness on feet (R26.81);Other abnormalities of gait and mobility (R26.89);Muscle weakness (generalized) (M62.81)     Time: 1610-9604 PT Time Calculation (min) (ACUTE ONLY): 26 min  Charges:    $Gait Training: 8-22 mins $Therapeutic Activity: 8-22 mins PT General Charges $$ ACUTE PT VISIT: 1 Visit                     Lucio Edward, PT    Lucio Edward 04/17/2023, 10:13 AM

## 2023-04-17 NOTE — TOC Progression Note (Signed)
Transition of Care [facility]) - Progression Note    Patient Details  Name: Anthony Ball MRN: [id number] Date of Birth: 11-Jul-1934  Transition of [facility]) CM/SW Contact  Josean Lycan A [place], [place] Phone Number: 04/17/2023, 3:41 PM  Clinical Narrative:     CSW contacted pt's sister, Bonita Quin to provide bed offers for pt.  There was no answer, CSW left HIPAA compliant vm with contact information to reach out to CSW.    TOC will continue to follow.   Expected Discharge Plan: Skilled Nursing Facility Barriers to Discharge: Insurance Authorization, SNF Pending bed offer, Continued Medical Work up  Expected Discharge Plan and Services In-house Referral: Clinical Social Work     Living arrangements for the past 2 months: Single Family Home                                       Social Determinants of Health (SDOH) Interventions SDOH Screenings   Food Insecurity: No Food Insecurity (04/13/2023)  Housing: Low Risk  (04/13/2023)  Transportation Needs: No Transportation Needs (04/13/2023)  Utilities: Not At Risk (04/13/2023)  Social Connections: Unknown (04/13/2023)  Tobacco Use: Unknown (04/13/2023)    Readmission Risk Interventions     No data to display

## 2023-04-17 NOTE — Progress Notes (Addendum)
Progress Note     Subjective/Interval: No nausea, BM x 2 yesterday. Contrast in colon on XR  Objective: Vital signs in last 24 hours: Temp:  [98.1 F (36.7 C)-98.4 F (36.9 C)] 98.4 F (36.9 C) (01/28 0443) Pulse Rate:  [83-86] 86 (01/28 0443) Resp:  [18] 18 (01/28 0443) BP: (135-158)/(73-84) 135/73 (01/28 0443) SpO2:  [91 %-92 %] 92 % (01/27 1936) Last BM Date : 04/16/23  Intake/Output from previous day: 01/27 0701 - 01/28 0700 In: 1023.6 [I.V.:673.6; IV Piggyback:350] Out: -  Intake/Output this shift: No intake/output data recorded.  PE: General: pleasant, WD, elderly male who is laying in bed in NAD Heart: regular, rate, and rhythm.   Lungs:  Respiratory effort nonlabored on nasal cannula  Abd: soft, NT, nondistended    Lab Results:  Recent Labs    04/15/23 0855 04/17/23 0616  WBC 13.5* 9.5  HGB 13.0 12.9*  HCT 39.9 38.6*  PLT 378 403*   BMET Recent Labs    04/15/23 0855 04/17/23 0616  NA 146* 141  K 3.3* 3.3*  CL 110 109  CO2 26 24  GLUCOSE 116* 112*  BUN 22 14  CREATININE 0.96 0.70  CALCIUM 8.6* 8.3*   PT/INR No results for input(s): "LABPROT", "INR" in the last 72 hours. CMP     Component Value Date/Time   NA 141 04/17/2023 0616   K 3.3 (L) 04/17/2023 0616   CL 109 04/17/2023 0616   CO2 24 04/17/2023 0616   GLUCOSE 112 (H) 04/17/2023 0616   BUN 14 04/17/2023 0616   CREATININE 0.70 04/17/2023 0616   CALCIUM 8.3 (L) 04/17/2023 0616   PROT 6.5 04/13/2023 0611   ALBUMIN 2.9 (L) 04/13/2023 0611   AST 45 (H) 04/13/2023 0611   ALT 18 04/13/2023 0611   ALKPHOS 70 04/13/2023 0611   BILITOT 0.8 04/13/2023 0611   GFRNONAA >60 04/17/2023 0616   GFRAA >90 04/13/2012 1157   Lipase  No results found for: "LIPASE"     Studies/Results: DG Abd Portable 1V-Small Bowel Obstruction Protocol-initial, 8 hr delay Result Date: 04/16/2023 CLINICAL DATA:  8 hour small-bowel follow-through EXAM: PORTABLE ABDOMEN - 1 VIEW COMPARISON:  Film from  earlier in the same day. FINDINGS: Administered contrast material now lies entirely within the colon. Some residual small bowel dilatation is noted consistent with a partial small bowel obstruction. No free air is noted. No other focal abnormality is seen. IMPRESSION: Contrast now lies entirely within the colon consistent with a partial small bowel obstruction. Persistent but improved small bowel dilatation is noted. Electronically Signed   By: Alcide Clever M.D.   On: 04/16/2023 22:06   DG Abd Portable 1V Result Date: 04/16/2023 CLINICAL DATA:  Ileus EXAM: PORTABLE ABDOMEN - 1 VIEW COMPARISON:  X-ray 04/15/2023 and older FINDINGS: Persistent distension of air-filled loops of small bowel. The loop previously in left upper quadrant measuring 4.5 cm in diameter today measures 4.2 cm. Other loops are slightly less dilated with some residual. There is air in nondilated colon diffusely with scattered stool. Air and stool towards the rectum. No obstruction. No definite free air. Curvature and degenerative changes along the spine. Overlapping cardiac leads. IMPRESSION: Slight improvement in dilated air-filled loops of small bowel with significant residual. Recommend continued surveillance. Electronically Signed   By: Karen Kays M.D.   On: 04/16/2023 10:06    Anti-infectives: Anti-infectives (From admission, onward)    Start     Dose/Rate Route Frequency Ordered Stop   04/14/23 0000  azithromycin (ZITHROMAX) 500 mg in sodium chloride 0.9 % 250 mL IVPB        500 mg 250 mL/hr over 60 Minutes Intravenous Every 24 hours 04/13/23 0541 04/18/23 2359   04/13/23 2200  cefTRIAXone (ROCEPHIN) 2 g in sodium chloride 0.9 % 100 mL IVPB        2 g 200 mL/hr over 30 Minutes Intravenous Every 24 hours 04/13/23 0541 04/18/23 2159   04/12/23 2345  cefTRIAXone (ROCEPHIN) 1 g in sodium chloride 0.9 % 100 mL IVPB        1 g 200 mL/hr over 30 Minutes Intravenous  Once 04/12/23 2337 04/13/23 0034   04/12/23 2345  azithromycin  (ZITHROMAX) 500 mg in sodium chloride 0.9 % 250 mL IVPB        500 mg 250 mL/hr over 60 Minutes Intravenous  Once 04/12/23 2337 04/13/23 0102        Assessment/Plan  PSBO v ileus  - CT 1/24 with diffuse small bowel dilatation except some segments in RLQ, distal SBO vs ileus, small volume perihepatic and pelvic ascites - has removed multiple NGT - XR last night with contrast in colon, some small bowel dilation but improved, nondistended on physical exam this AM. Likely ileus that is resolving. If persistent nausea/issues with advancing diet can consider engaging GI for ileus management/recs - no indication for emergent surgical intervention  - mobilize as able  - keep K> 4.0, Mg >2.0 to optimize bowel function   FEN: CLD, ADAT VTE: SCDs, ok to have LMWH or SQH from surgery standpoint ID: azithromycin/rocephin  - per TRH -  Right UPJ stone CAP - influenza A positive  COPD Dementia Esophageal wall thickening  Hypothyroidism   LOS: 4 days   I reviewed hospitalist notes, last 24 h vitals and pain scores, last 48 h intake and output, last 24 h labs and trends, and last 24 h imaging results.  This care required straight-forward level of medical decision making.   We will sign off at this time. Please call us if any new issues or concerns arise.   Lysle Rubens, MD [facility] Surgery 04/17/2023, 8:16 AM Please see Amion for pager number during day hours 7:00am-4:30pm

## 2023-04-17 NOTE — Progress Notes (Addendum)
PROGRESS NOTE  Anthony Ball:[id number] DOB: 11-28-1934 DOA: 04/12/2023 PCP: Georgann Housekeeper, MD   LOS: 4 days   Brief narrative:  Anthony Ball is a 89 y.o. male with history of COPD, dementia, hypothyroidism presented to the hospital with shortness of breath and productive cough.  Poor historian.  Of note patient was recently seen by cardiology and had a stress test done on 04/03/2023 which was unremarkable.  In the ED, patient was slightly tachycardic and tachypneic.  Was on 2 L of  [place] oxygen.  Initial labs showed leukocytosis with WBC of 19.4.  Lactate was elevated at 2.9.  BNP of 45.  Patient tested positive for influenza A.  WBC was significantly elevated at 22.5.  Chest x-ray showed infiltrates.  During ED stay, patient also had multiple episodes of vomiting and had distention of the abdomen.  CT scan of the abdomen pelvis showed features concerning for bowel obstruction and chest CT showed pneumonia.  He also had right ureteric stone.  Patient was then admitted hospital for further evaluation and treatment.    Assessment/Plan:  Principal Problem:   CAP (community acquired pneumonia) Active Problems:   SBO (small bowel obstruction) (HCC)   Ureteral stone   COPD (chronic obstructive pulmonary disease) (HCC)   Hypothyroidism   Dementia (HCC)   GERD (gastroesophageal reflux disease)  Possible partial small bowel obstruction -   as per CT scan of the abdomen.  Patient had abdominal distention nausea vomiting on presentation.  Was initially  placed on NG tube which has been discontinued by the patient.  General surgery following.  No further symptoms at this time.   Received IV hydration with D5 Ringer lactate, Analgesics, antiemetics as necessary.  Abdominal x-ray today with some contrast in the colon.  Possibility of partial small bowel obstruction.  General surgery has started the patient on clears today.  Advance diet as tolerated.  General surgery has signed off at this time.   Continue to follow surgical recommendation.  Pneumonia with influenza A positive, leukocytosis trending down to 9.5 from 13.5.  Blood cultures negative in 4 days..  On empiric antibiotic with Rocephin and Zithromax.  Will continue to complete 5-day course.   Right UPJ stone with right-sided hydronephrosis.  Seen by urology.  Blood cultures negative in 4 days.  Urine culture no growth.  Urology recommended Flomax so has been on doxazosin since Flomax can not be crushed.   Latest creatinine of 0.7 and patient has been urinating okay  Hypokalemia.  Will aggressively replenish due to concerns for recent ileus/partial bowel obstruction.  Will give 40 mEq of IV and 40 p.o. today.  Check levels in AM.   COPD continue bronchodilators with nebulizers.  Appears to be compensated.   Diffuse esophageal thickening seen on the CT scan.  Might be esophagitis.  Might need EGD at some point.   Dementia takes Namenda at home.  Has been restarted.   Hypothyroidism continue Synthroid    DVT prophylaxis: SCDs Start: 04/13/23 0540   Disposition: Skilled nursing facility likely in 1 to 2 days when tolerating tolerating p.o. okay and resolved.  Status is: Inpatient  Remains inpatient appropriate because: Pending clinical improvement of partial bowel obstruction,, need for placement.    Code Status:     Code Status: Full Code  Family Communication:   Spoke with the patient's Sister Bonita Quin on the phone 04/16/2023.  Consultants: Urology General surgery  Procedures: NG tube insertion and  removal  Anti-infectives:  Rocephin and  Zithromax IV  Anti-infectives (From admission, onward)    Start     Dose/Rate Route Frequency Ordered Stop   04/14/23 0000  azithromycin (ZITHROMAX) 500 mg in sodium chloride 0.9 % 250 mL IVPB        500 mg 250 mL/hr over 60 Minutes Intravenous Every 24 hours 04/13/23 0541 04/18/23 2359   04/13/23 2200  cefTRIAXone (ROCEPHIN) 2 g in sodium chloride 0.9 % 100 mL IVPB         2 g 200 mL/hr over 30 Minutes Intravenous Every 24 hours 04/13/23 0541 04/18/23 2159   04/12/23 2345  cefTRIAXone (ROCEPHIN) 1 g in sodium chloride 0.9 % 100 mL IVPB        1 g 200 mL/hr over 30 Minutes Intravenous  Once 04/12/23 2337 04/13/23 0034   04/12/23 2345  azithromycin (ZITHROMAX) 500 mg in sodium chloride 0.9 % 250 mL IVPB        500 mg 250 mL/hr over 60 Minutes Intravenous  Once 04/12/23 2337 04/13/23 0102       Subjective: Today, patient was seen and examined at bedside.  Patient appears to be less agitated and more communicative today.  Denies any nausea, vomiting, has mild abdominal discomfort.  Poor historian.  Oriented to place.     Objective: Vitals:   04/17/23 0821 04/17/23 1004  BP: (!) 150/72   Pulse: 70 82  Resp: 17   Temp: 97.7 F (36.5 C)   SpO2: 93% 94%    Intake/Output Summary (Last 24 hours) at 04/17/2023 1401 Last data filed at 04/17/2023 0300 Gross per 24 hour  Intake 1023.6 ml  Output --  Net 1023.6 ml   Filed Weights   04/12/23 1757 04/13/23 1725  Weight: 61 kg 56.2 kg   Body mass index is 20 kg/m.   Physical Exam:  GENERAL: Patient is alert awake and oriented to self and place.  Elderly male, appears chronically ill, on nasal cannula oxygen, bilateral mittens in place HENT: No scleral pallor or icterus. Pupils equally reactive to light. Oral mucosa is moist NECK: is supple, no gross swelling noted. CHEST: Decreased breath sounds bilaterally,  CVS: S1 and S2 heard, no murmur. Regular rate and rhythm.  ABDOMEN: Soft, non-tender, bowel sounds are present. EXTREMITIES: No edema. CNS: Cranial nerves are intact.  Moves all extremities.   SKIN: warm and dry without rashes.  Data Review: I have personally reviewed the following laboratory data and studies,  CBC: Recent Labs  Lab 04/12/23 1850 04/12/23 1900 04/13/23 0611 04/14/23 0945 04/15/23 0855 04/17/23 0616  WBC 19.4*  --  22.5* 18.4* 13.5* 9.5  NEUTROABS 16.0*  --  21.3*   --   --   --   HGB 16.0 16.7 15.0 13.4 13.0 12.9*  HCT 47.8 49.0 44.3 40.8 39.9 38.6*  MCV 87.1  --  86.2 87.2 87.7 87.1  PLT 322  --  320 332 378 403*   Basic Metabolic Panel: Recent Labs  Lab 04/12/23 1850 04/12/23 1900 04/13/23 0611 04/14/23 0945 04/15/23 0855 04/17/23 0616  NA 136 135 138 144 146* 141  K 3.8 3.7 3.7 3.3* 3.3* 3.3*  CL 99  --  99 105 110 109  CO2 24  --  22 26 26 24   GLUCOSE 125*  --  164* 109* 116* 112*  BUN 30*  --  29* 29* 22 14  CREATININE 1.12  --  1.01 1.15 0.96 0.70  CALCIUM 9.1  --  9.3 9.1 8.6* 8.3*  MG  --   --   --  2.3  --  2.2  PHOS  --   --   --  2.3*  --   --    Liver Function Tests: Recent Labs  Lab 04/12/23 1850 04/13/23 0611  AST 28 45*  ALT 14 18  ALKPHOS 71 70  BILITOT 0.9 0.8  PROT 6.6 6.5  ALBUMIN 3.2* 2.9*   No results for input(s): "LIPASE", "AMYLASE" in the last 168 hours. No results for input(s): "AMMONIA" in the last 168 hours. Cardiac Enzymes: No results for input(s): "CKTOTAL", "CKMB", "CKMBINDEX", "TROPONINI" in the last 168 hours. BNP (last 3 results) Recent Labs    04/12/23 1850  BNP 45.9    ProBNP (last 3 results) No results for input(s): "PROBNP" in the last 8760 hours.  CBG: Recent Labs  Lab 04/14/23 0610 04/14/23 0809 04/14/23 1229 04/15/23 0015 04/15/23 0647  GLUCAP 103* 105* 102* 94 102*   Recent Results (from the past 240 hours)  Resp panel by RT-PCR (RSV, Flu A&B, Covid) Anterior Nasal Swab     Status: Abnormal   Collection Time: 04/12/23  6:57 PM   Specimen: Anterior Nasal Swab  Result Value Ref Range Status   SARS Coronavirus 2 by RT PCR NEGATIVE NEGATIVE Final   Influenza A by PCR POSITIVE (A) NEGATIVE Final   Influenza B by PCR NEGATIVE NEGATIVE Final    Comment: (NOTE) The Xpert Xpress SARS-CoV-2/FLU/RSV plus assay is intended as an aid in the diagnosis of influenza from Nasopharyngeal swab specimens and should not be used as a sole basis for treatment. Nasal washings  and aspirates are unacceptable for Xpert Xpress SARS-CoV-2/FLU/RSV testing.  Fact Sheet for Patients: BloggerCourse.com  Fact Sheet for Healthcare Providers: SeriousBroker.it  This test is not yet approved or cleared by the [place] FDA and has been authorized for detection and/or diagnosis of SARS-CoV-2 by FDA under an Emergency Use Authorization (EUA). This EUA will remain in effect (meaning this test can be used) for the duration of the COVID-19 declaration under Section 564(b)(1) of the Act, 21 U.S.C. section 360bbb-3(b)(1), unless the authorization is terminated or revoked.     Resp Syncytial Virus by PCR NEGATIVE NEGATIVE Final    Comment: (NOTE) Fact Sheet for Patients: BloggerCourse.com  Fact Sheet for Healthcare Providers: SeriousBroker.it  This test is not yet approved or cleared by the [place] FDA and has been authorized for detection and/or diagnosis of SARS-CoV-2 by FDA under an Emergency Use Authorization (EUA). This EUA will remain in effect (meaning this test can be used) for the duration of the COVID-19 declaration under Section 564(b)(1) of the Act, 21 U.S.C. section 360bbb-3(b)(1), unless the authorization is terminated or revoked.  Performed at [facility], [address] [place], [place] [address]   Culture, blood (routine x 2)     Status: None (Preliminary result)   Collection Time: 04/12/23 11:37 PM   Specimen: BLOOD  Result Value Ref Range Status   Specimen Description BLOOD SITE NOT SPECIFIED  Final   Special Requests   Final    BOTTLES DRAWN AEROBIC AND ANAEROBIC Blood Culture adequate volume   Culture   Final    NO GROWTH 4 DAYS Performed at [facility], [address] [place], [place] [address]    Report Status PENDING  Incomplete  Culture, blood (routine x 2)     Status: None (Preliminary result)   Collection Time:  04/12/23 11:58 PM   Specimen: BLOOD RIGHT ARM  Result Value Ref Range Status  Specimen Description BLOOD RIGHT ARM  Final   Special Requests   Final    BOTTLES DRAWN AEROBIC AND ANAEROBIC Blood Culture adequate volume   Culture   Final    NO GROWTH 4 DAYS Performed at [facility], [address], [place], [place] [address]    Report Status PENDING  Incomplete  Urine Culture (for pregnant, neutropenic or urologic patients or patients with an indwelling urinary catheter)     Status: None   Collection Time: 04/13/23  8:26 AM   Specimen: Urine, Clean Catch  Result Value Ref Range Status   Specimen Description URINE, CLEAN CATCH  Final   Special Requests NONE  Final   Culture   Final    NO GROWTH Performed at [facility], [address] [place], [place] [address]    Report Status 04/14/2023 FINAL  Final     Studies: DG Abd Portable 1V-Small Bowel Obstruction Protocol-initial, 8 hr delay Result Date: 04/16/2023 CLINICAL DATA:  8 hour small-bowel follow-through EXAM: PORTABLE ABDOMEN - 1 VIEW COMPARISON:  Film from earlier in the same day. FINDINGS: Administered contrast material now lies entirely within the colon. Some residual small bowel dilatation is noted consistent with a partial small bowel obstruction. No free air is noted. No other focal abnormality is seen. IMPRESSION: Contrast now lies entirely within the colon consistent with a partial small bowel obstruction. Persistent but improved small bowel dilatation is noted. Electronically Signed   By: Alcide Clever M.D.   On: 04/16/2023 22:06   DG Abd Portable 1V Result Date: 04/16/2023 CLINICAL DATA:  Ileus EXAM: PORTABLE ABDOMEN - 1 VIEW COMPARISON:  X-ray 04/15/2023 and older FINDINGS: Persistent distension of air-filled loops of small bowel. The loop previously in left upper quadrant measuring 4.5 cm in diameter today measures 4.2 cm. Other loops are slightly less dilated with some residual. There is air in nondilated  colon diffusely with scattered stool. Air and stool towards the rectum. No obstruction. No definite free air. Curvature and degenerative changes along the spine. Overlapping cardiac leads. IMPRESSION: Slight improvement in dilated air-filled loops of small bowel with significant residual. Recommend continued surveillance. Electronically Signed   By: Karen Kays M.D.   On: 04/16/2023 10:06      Joycelyn Das, MD  [facility] 04/17/2023  If 7PM-7AM, please contact night-coverage

## 2023-04-17 NOTE — Progress Notes (Signed)
Mobility Specialist: Progress Note   04/17/23 1227  Mobility  Activity Ambulated with assistance in hallway  Level of Assistance Contact guard assist, steadying assist  Assistive Device None  Distance Ambulated (ft) 50 ft  Activity Response Tolerated well  Mobility Referral Yes  Mobility visit 1 Mobility  Mobility Specialist Start Time (ACUTE ONLY) 0845  Mobility Specialist Stop Time (ACUTE ONLY) 0905  Mobility Specialist Time Calculation (min) (ACUTE ONLY) 20 min    Pt was agreeable to mobility session - received in bed. No complaints. Cg throughout. SpO2 WFL on 2LO2 throughout ambulation. Returned to room without fault. Left in bed with all needs met, call bell in reach.   Maurene Capes Mobility Specialist Please contact via SecureChat or Rehab office at [phone]

## 2023-04-18 ENCOUNTER — Ambulatory Visit (INDEPENDENT_AMBULATORY_CARE_PROVIDER_SITE_OTHER): Payer: Medicare Other

## 2023-04-18 DIAGNOSIS — F0390 Unspecified dementia without behavioral disturbance: Secondary | ICD-10-CM

## 2023-04-18 DIAGNOSIS — K56609 Unspecified intestinal obstruction, unspecified as to partial versus complete obstruction: Secondary | ICD-10-CM | POA: Diagnosis not present

## 2023-04-18 DIAGNOSIS — J09X1 Influenza due to identified novel influenza A virus with pneumonia: Principal | ICD-10-CM

## 2023-04-18 DIAGNOSIS — J9601 Acute respiratory failure with hypoxia: Secondary | ICD-10-CM

## 2023-04-18 DIAGNOSIS — N201 Calculus of ureter: Secondary | ICD-10-CM | POA: Diagnosis not present

## 2023-04-18 DIAGNOSIS — J189 Pneumonia, unspecified organism: Secondary | ICD-10-CM | POA: Diagnosis not present

## 2023-04-18 LAB — BASIC METABOLIC PANEL
Anion gap: 11 (ref 5–15)
BUN: 7 mg/dL — ABNORMAL LOW (ref 8–23)
CO2: 24 mmol/L (ref 22–32)
Calcium: 8.5 mg/dL — ABNORMAL LOW (ref 8.9–10.3)
Chloride: 102 mmol/L (ref 98–111)
Creatinine, Ser: 0.71 mg/dL (ref 0.61–1.24)
GFR, Estimated: >60 mL/min (ref >60)
Glucose, Bld: 87 mg/dL (ref 70–99)
Potassium: 3.7 mmol/L (ref 3.5–5.1)
Sodium: 137 mmol/L (ref 135–145)

## 2023-04-18 LAB — CBC
HCT: 40.3 % (ref 39.0–52.0)
Hemoglobin: 13.6 g/dL (ref 13.0–17.0)
MCH: 29.2 pg (ref 26.0–34.0)
MCHC: 33.7 g/dL (ref 30.0–36.0)
MCV: 86.5 fL (ref 80.0–100.0)
Platelets: 409 10*3/uL — ABNORMAL HIGH (ref 150–400)
RBC: 4.66 MIL/uL (ref 4.22–5.81)
RDW: 13.8 % (ref 11.5–15.5)
WBC: 8.6 10*3/uL (ref 4.0–10.5)
nRBC: 0 % (ref 0.0–0.2)

## 2023-04-18 LAB — PHOSPHORUS: Phosphorus: 2.8 mg/dL (ref 2.5–4.6)

## 2023-04-18 LAB — CULTURE, BLOOD (ROUTINE X 2)
Culture: NO GROWTH
Culture: NO GROWTH
Special Requests: ADEQUATE
Special Requests: ADEQUATE

## 2023-04-18 LAB — MAGNESIUM: Magnesium: 2 mg/dL (ref 1.7–2.4)

## 2023-04-18 MED ORDER — IPRATROPIUM-ALBUTEROL 0.5-2.5 (3) MG/3ML IN SOLN
3.0000 mL | Freq: Four times a day (QID) | RESPIRATORY_TRACT | Status: DC
  Administered 2023-04-18 – 2023-04-19 (×3): 3 mL via RESPIRATORY_TRACT
  Filled 2023-04-18 (×4): qty 3

## 2023-04-18 MED ORDER — PANTOPRAZOLE SODIUM 40 MG PO TBEC
40.0000 mg | DELAYED_RELEASE_TABLET | Freq: Every day | ORAL | Status: DC
  Administered 2023-04-18 – 2023-05-14 (×27): 40 mg via ORAL
  Filled 2023-04-18 (×27): qty 1

## 2023-04-18 NOTE — Assessment & Plan Note (Addendum)
04-18-2023 start qid nebs due to hypoxia. 04-19-2023 stable. Continue with nebs  04-20-2023 continue with nebs qid. Stable.

## 2023-04-18 NOTE — Assessment & Plan Note (Signed)
04-18-2023 pt has completed 5 days of rocephin/zithromax. Last dose on 04-18-2023. 04-19-2023 resolved.

## 2023-04-18 NOTE — Assessment & Plan Note (Signed)
04-18-2023 urology not going to intervene. Continue flomax.  04-19-2023 stable.

## 2023-04-18 NOTE — Assessment & Plan Note (Addendum)
04-18-2023 chronic. 04-19-2023 stable.  04-20-2023 chronic and stable.

## 2023-04-18 NOTE — Assessment & Plan Note (Addendum)
04-18-2023 stable on synthroid 25 mcg daily. 04-19-2023 stable. 04-20-2023 stable on 25 mcg synthroid daily.

## 2023-04-18 NOTE — Assessment & Plan Note (Signed)
04-18-2023 pt needed supplemental O2 with walking. May need home O2.  04-19-2023 on RA at rest. did not require supplemental O2 with ambulation today. Walked 200 feet.

## 2023-04-18 NOTE — Progress Notes (Signed)
PROGRESS NOTE    Anthony Ball  ION:[id number] DOB: Oct 11, 1934 DOA: 04/12/2023 PCP: Georgann Housekeeper, MD  Subjective: Pt seen and examined. Not aware of his surroundings. General surgery has signed off. Pt did cough some with drinking water. ST consulted.   Hospital Course: HPI: Anthony Ball is a 89 y.o. male with history of COPD, dementia, hypothyroidism who was recently evaluated by cardiologist for chest pain and had stress test on 04/02/2013 which was unremarkable with normal LV was brought to the ER after patient was complaining of shortness of breath and productive cough.  Patient has dementia and is not able to give good history.  I was able to reach one of the sisters of the patient but they have requested to contact another sister by the name [name] who is not at this time reachable.   ED Course: In the ER patient was hypoxic requiring 2 L oxygen with chest x-ray showing concerning features for infiltrates WBC count was 19,000.  Patient was started on empiric antibiotics for pneumonia.  At the time of my exam patient started having multiple episodes of vomiting and abdomen appeared distended.  I ordered a stat CT abdomen pelvis CT chest.  CT abdomen pelvis shows features concerning for bowel obstruction and CT chest shows pneumonia.  Also shows features of right ureteric stone.  Significant Events: Admitted 04/12/2023 for SBO, pneumonia, influenza A 04-13-2023 pt seen by general surgery. NG tube inserted 04-14-2023 pt pulled out his NG tube 04-17-2023 resolution of SBO vs ileus  Significant Labs: Positive for Influenza A WBC 19.4, Hgb 16, Plt 322 Na 136, K 3.8, BUN 30, Scr 1.12, glu 125  Significant Imaging Studies: CXR shows Right lower lobe airspace opacity concerning for pneumonia.  CT chest/abd/pelvis shows Diffuse small bowel dilatation except for small caliber segments in the right lower quadrant, maximum small bowel caliber 3.7 cm. A transitional segment was not found  but the difference in caliber is more suggestive of a distal small bowel obstruction than ileus. Etiology is undetermined. 2. Small volume of perihepatic and pelvic ascites. No pneumatosis, free air or free hemorrhage. 3. Bilateral lower lobe consolidation with air bronchograms, right greater than left, which could be due to atelectasis,  pneumonia or aspiration. 4. Additional tree-in-bud interstitial micronodularity in the superior segments of both lower lobes, consistent with infectious or inflammatory bronchiolitis. 5. Diffuse esophageal wall thickening including over the GE junction. Endoscopy recommended. Small hiatal hernia.  6. Aortic and coronary artery atherosclerosis. 7. 5 mm in length by 2 mm in diameter rod-like right UPJ stone with mild hydronephrosis. 8. Nonobstructive bilateral micronephrolithiasis. 9. Prostatomegaly, transverse axis 6 cm. 10. Colonic diverticulosis without evidence of diverticulitis. 11. Osteopenia and degenerative change.  Antibiotic Therapy: Anti-infectives (From admission, onward)    Start     Dose/Rate Route Frequency Ordered Stop   04/14/23 0000  azithromycin (ZITHROMAX) 500 mg in sodium chloride 0.9 % 250 mL IVPB        500 mg 250 mL/hr over 60 Minutes Intravenous Every 24 hours 04/13/23 0541 04/18/23 0110   04/13/23 2200  cefTRIAXone (ROCEPHIN) 2 g in sodium chloride 0.9 % 100 mL IVPB        2 g 200 mL/hr over 30 Minutes Intravenous Every 24 hours 04/13/23 0541 04/17/23 2317   04/12/23 2345  cefTRIAXone (ROCEPHIN) 1 g in sodium chloride 0.9 % 100 mL IVPB        1 g 200 mL/hr over 30 Minutes Intravenous  Once 04/12/23 2337  04/13/23 0034   04/12/23 2345  azithromycin (ZITHROMAX) 500 mg in sodium chloride 0.9 % 250 mL IVPB        500 mg 250 mL/hr over 60 Minutes Intravenous  Once 04/12/23 2337 04/13/23 0102       Procedures:   Consultants: Urology General surgery    Assessment and Plan: * CAP (community acquired pneumonia) 04-18-2023 pt has  completed 5 days of rocephin/zithromax. Last dose on 04-18-2023.  Acute respiratory failure with hypoxia (HCC) 04-18-2023 pt needed supplemental O2 with walking. May need home O2.  Ureteral stone 04-18-2023 urology not going to intervene. Continue flomax.  SBO (small bowel obstruction) (HCC) 04-18-2023 KUB from yesterday shows contrast in colon. Pt did some coughing with swallowing water. Will consult ST to assess for dysphagia. General surgery has signed off.  GERD (gastroesophageal reflux disease) 04-18-2023 continue protonix.  Dementia (HCC) 04-18-2023 chronic.  Hypothyroidism 04-18-2023 stable on synthroid 25 mcg daily.  COPD (chronic obstructive pulmonary disease) (HCC) 04-18-2023 start qid nebs due to hypoxia.  Influenza A with pneumonia 04-18-2023 continue supportive care. Pt completed 5 days of abx.   DVT prophylaxis: SCDs Start: 04/13/23 0540    Code Status: Full Code Family Communication: no family at bedside Disposition Plan: SNF Reason for continuing need for hospitalization: remains on supplemental O2. Pt does not normally use home O2.  Objective: Vitals:   04/17/23 1605 04/17/23 1913 04/18/23 0455 04/18/23 0837  BP: 137/82 (!) 166/92 130/84 137/79  Pulse: 96 (!) 102 72 74  Resp: 17 18 18    Temp: 97.6 F (36.4 C) 98.6 F (37 C) 99 F (37.2 C) 97.7 F (36.5 C)  TempSrc:      SpO2: 90%   92%  Weight:      Height:        Intake/Output Summary (Last 24 hours) at 04/18/2023 1337 Last data filed at 04/18/2023 0355 Gross per 24 hour  Intake 665.08 ml  Output --  Net 665.08 ml   Filed Weights   04/12/23 1757 04/13/23 1725  Weight: 61 kg 56.2 kg    Examination:  Physical Exam Vitals and nursing note reviewed.  Constitutional:      General: He is not in acute distress.    Appearance: He is not toxic-appearing.  HENT:     Head: Normocephalic and atraumatic.     Nose: Nose normal.  Eyes:     General: No scleral icterus. Cardiovascular:      Rate and Rhythm: Regular rhythm. Tachycardia present.  Pulmonary:     Effort: Pulmonary effort is normal.     Breath sounds: Normal breath sounds.  Abdominal:     General: Abdomen is protuberant. Bowel sounds are normal.     Palpations: Abdomen is soft.     Tenderness: There is no abdominal tenderness. There is no guarding or rebound.  Musculoskeletal:     Right lower leg: No edema.     Left lower leg: No edema.  Skin:    General: Skin is warm and dry.     Capillary Refill: Capillary refill takes less than 2 seconds.  Neurological:     Mental Status: He is alert. He is disoriented.     Data Reviewed: I have personally reviewed following labs and imaging studies  CBC: Recent Labs  Lab 04/12/23 1850 04/12/23 1900 04/13/23 0611 04/14/23 0945 04/15/23 0855 04/17/23 0616 04/18/23 0625  WBC 19.4*  --  22.5* 18.4* 13.5* 9.5 8.6  NEUTROABS 16.0*  --  21.3*  --   --   --   --  HGB 16.0   < > 15.0 13.4 13.0 12.9* 13.6  HCT 47.8   < > 44.3 40.8 39.9 38.6* 40.3  MCV 87.1  --  86.2 87.2 87.7 87.1 86.5  PLT 322  --  320 332 378 403* 409*   < > = values in this interval not displayed.   Basic Metabolic Panel: Recent Labs  Lab 04/13/23 0611 04/14/23 0945 04/15/23 0855 04/17/23 0616 04/18/23 0625  NA 138 144 146* 141 137  K 3.7 3.3* 3.3* 3.3* 3.7  CL 99 105 110 109 102  CO2 22 26 26 24 24   GLUCOSE 164* 109* 116* 112* 87  BUN 29* 29* 22 14 7*  CREATININE 1.01 1.15 0.96 0.70 0.71  CALCIUM 9.3 9.1 8.6* 8.3* 8.5*  MG  --  2.3  --  2.2 2.0  PHOS  --  2.3*  --   --  2.8   GFR: Estimated Creatinine Clearance: 52.7 mL/min (by C-G formula based on SCr of 0.71 mg/dL). Liver Function Tests: Recent Labs  Lab 04/12/23 1850 04/13/23 0611  AST 28 45*  ALT 14 18  ALKPHOS 71 70  BILITOT 0.9 0.8  PROT 6.6 6.5  ALBUMIN 3.2* 2.9*   BNP (last 3 results) Recent Labs    04/12/23 1850  BNP 45.9   CBG: Recent Labs  Lab 04/14/23 0610 04/14/23 0809 04/14/23 1229 04/15/23 0015  04/15/23 0647  GLUCAP 103* 105* 102* 94 102*   Sepsis Labs: Recent Labs  Lab 04/12/23 1903 04/12/23 2134 04/13/23 0709  PROCALCITON  --   --  0.44  LATICACIDVEN 2.9* 1.6  --     Recent Results (from the past 240 hours)  Resp panel by RT-PCR (RSV, Flu A&B, Covid) Anterior Nasal Swab     Status: Abnormal   Collection Time: 04/12/23  6:57 PM   Specimen: Anterior Nasal Swab  Result Value Ref Range Status   SARS Coronavirus 2 by RT PCR NEGATIVE NEGATIVE Final   Influenza A by PCR POSITIVE (A) NEGATIVE Final   Influenza B by PCR NEGATIVE NEGATIVE Final    Comment: (NOTE) The Xpert Xpress SARS-CoV-2/FLU/RSV plus assay is intended as an aid in the diagnosis of influenza from Nasopharyngeal swab specimens and should not be used as a sole basis for treatment. Nasal washings and aspirates are unacceptable for Xpert Xpress SARS-CoV-2/FLU/RSV testing.  Fact Sheet for Patients: BloggerCourse.com  Fact Sheet for Healthcare Providers: SeriousBroker.it  This test is not yet approved or cleared by the [place] FDA and has been authorized for detection and/or diagnosis of SARS-CoV-2 by FDA under an Emergency Use Authorization (EUA). This EUA will remain in effect (meaning this test can be used) for the duration of the COVID-19 declaration under Section 564(b)(1) of the Act, 21 U.S.C. section 360bbb-3(b)(1), unless the authorization is terminated or revoked.     Resp Syncytial Virus by PCR NEGATIVE NEGATIVE Final    Comment: (NOTE) Fact Sheet for Patients: BloggerCourse.com  Fact Sheet for Healthcare Providers: SeriousBroker.it  This test is not yet approved or cleared by the [place] FDA and has been authorized for detection and/or diagnosis of SARS-CoV-2 by FDA under an Emergency Use Authorization (EUA). This EUA will remain in effect (meaning this test can be used) for the  duration of the COVID-19 declaration under Section 564(b)(1) of the Act, 21 U.S.C. section 360bbb-3(b)(1), unless the authorization is terminated or revoked.  Performed at [facility], [address] [place], [place] [address]   Culture, blood (  routine x 2)     Status: None   Collection Time: 04/12/23 11:37 PM   Specimen: BLOOD  Result Value Ref Range Status   Specimen Description BLOOD SITE NOT SPECIFIED  Final   Special Requests   Final    BOTTLES DRAWN AEROBIC AND ANAEROBIC Blood Culture adequate volume   Culture   Final    NO GROWTH 5 DAYS Performed at [facility], [address], [place], [place] [address]    Report Status 04/18/2023 FINAL  Final  Culture, blood (routine x 2)     Status: None   Collection Time: 04/12/23 11:58 PM   Specimen: BLOOD RIGHT ARM  Result Value Ref Range Status   Specimen Description BLOOD RIGHT ARM  Final   Special Requests   Final    BOTTLES DRAWN AEROBIC AND ANAEROBIC Blood Culture adequate volume   Culture   Final    NO GROWTH 5 DAYS Performed at [facility], [address], [place], [place] [address]    Report Status 04/18/2023 FINAL  Final  Urine Culture (for pregnant, neutropenic or urologic patients or patients with an indwelling urinary catheter)     Status: None   Collection Time: 04/13/23  8:26 AM   Specimen: Urine, Clean Catch  Result Value Ref Range Status   Specimen Description URINE, CLEAN CATCH  Final   Special Requests NONE  Final   Culture   Final    NO GROWTH Performed at [facility], [address] [place], [place] [address]    Report Status 04/14/2023 FINAL  Final     Radiology Studies: DG Abd Portable 1V-Small Bowel Obstruction Protocol-24 hr delay Result Date: 04/17/2023 CLINICAL DATA:  [id number] SBO (small bowel obstruction) (HCC) [id number] EXAM: PORTABLE ABDOMEN - 1 VIEW COMPARISON:  the previous day's study FINDINGS: Stomach and small bowel nondilated. Residual contrast material in the  nondilated colon. No abnormal abdominal calcifications. Visualized lung bases clear. Mild lumbar dextroscoliosis with multilevel spondylitic change. IMPRESSION: Nonobstructive bowel gas pattern. Electronically Signed   By: Corlis Leak M.D.   On: 04/17/2023 16:02   DG Abd Portable 1V-Small Bowel Obstruction Protocol-initial, 8 hr delay Result Date: 04/16/2023 CLINICAL DATA:  8 hour small-bowel follow-through EXAM: PORTABLE ABDOMEN - 1 VIEW COMPARISON:  Film from earlier in the same day. FINDINGS: Administered contrast material now lies entirely within the colon. Some residual small bowel dilatation is noted consistent with a partial small bowel obstruction. No free air is noted. No other focal abnormality is seen. IMPRESSION: Contrast now lies entirely within the colon consistent with a partial small bowel obstruction. Persistent but improved small bowel dilatation is noted. Electronically Signed   By: Alcide Clever M.D.   On: 04/16/2023 22:06    Scheduled Meds:  doxazosin  1 mg Oral Daily   ipratropium-albuterol  3 mL Nebulization QID   levothyroxine  25 mcg Oral QAC breakfast   memantine  10 mg Oral Daily   pantoprazole  40 mg Oral Daily   QUEtiapine  25 mg Oral BID   Continuous Infusions:   LOS: 5 days   Time spent: 40 minutes  Carollee Herter, DO  [facility]  04/18/2023, 1:37 PM

## 2023-04-18 NOTE — Assessment & Plan Note (Signed)
04-18-2023 KUB from yesterday shows contrast in colon. Pt did some coughing with swallowing water. Will consult ST to assess for dysphagia. General surgery has signed off. 04-19-2023 resolved.

## 2023-04-18 NOTE — Plan of Care (Signed)
Problem: Education: Goal: Knowledge of General Education information will improve Description: Including pain rating scale, medication(s)/side effects and non-pharmacologic comfort measures Outcome: Progressing   Problem: Health Behavior/Discharge Planning: Goal: Ability to manage health-related needs will improve Outcome: Progressing   Problem: Clinical Measurements: Goal: Ability to maintain clinical measurements within normal limits will improve Outcome: Progressing Goal: Will remain free from infection Outcome: Progressing Goal: Diagnostic test results will improve Outcome: Progressing Goal: Respiratory complications will improve Outcome: Progressing Goal: Cardiovascular complication will be avoided Outcome: Progressing   Problem: Activity: Goal: Risk for activity intolerance will decrease Outcome: Progressing   Problem: Nutrition: Goal: Adequate nutrition will be maintained Outcome: Progressing   Problem: Coping: Goal: Level of anxiety will decrease Outcome: Progressing   Problem: Elimination: Goal: Will not experience complications related to bowel motility Outcome: Progressing Goal: Will not experience complications related to urinary retention Outcome: Progressing   Problem: Pain Managment: Goal: General experience of comfort will improve and/or be controlled Outcome: Progressing   Problem: Safety: Goal: Ability to remain free from injury will improve Outcome: Progressing   Problem: Skin Integrity: Goal: Risk for impaired skin integrity will decrease Outcome: Progressing   Problem: Activity: Goal: Ability to tolerate increased activity will improve Outcome: Progressing   Problem: Clinical Measurements: Goal: Ability to maintain a body temperature in the normal range will improve Outcome: Progressing   Problem: Respiratory: Goal: Ability to maintain adequate ventilation will improve Outcome: Progressing Goal: Ability to maintain a clear airway  will improve Outcome: Progressing   Problem: Safety: Goal: Non-violent Restraint(s) Outcome: Progressing

## 2023-04-18 NOTE — Progress Notes (Signed)
Occupational Therapy Treatment Patient Details Name: Anthony Ball MRN: [id number] DOB: Aug 17, 1934 Today's Date: 04/18/2023   History of present illness Anthony Ball is a 89 y.o. male  patient to hospital with shortness of breath and productive cough; influenza a with pneumonia, partial SBO with history of COPD, dementia, hypothyroidism   OT comments  Pt presented in bed but agreeable to session. He was able to complete hygiene post set up to CGA in a seated position for UE/face and min assist for LE ADLS with sitting to stand. Pt on RA was at 87-88% and was placed on 2L of o2 and was around 90%. He then completed toileting tasks with CGA to min assist to ensure proper hygiene. Patient will benefit from continued inpatient follow up therapy, <3 hours/day.      If plan is discharge home, recommend the following:  A little help with walking and/or transfers;A lot of help with bathing/dressing/bathroom;Assistance with cooking/housework;Direct supervision/assist for medications management;Direct supervision/assist for financial management;Help with stairs or ramp for entrance;Assist for transportation;Supervision due to cognitive status   Equipment Recommendations   (TBD-depding on prior cognition status needs increase in superivison at home)    Recommendations for Other Services      Precautions / Restrictions Precautions Precautions: Fall Precaution Comments: watch O2 sats on room air Restrictions Weight Bearing Restrictions Per Provider Order: No       Mobility Bed Mobility Overal bed mobility: Modified Independent Bed Mobility: Supine to Sit, Sit to Supine     Supine to sit: Modified independent (Device/Increase time) Sit to supine: Modified independent (Device/Increase time)   General bed mobility comments: increase in time    Transfers Overall transfer level: Needs assistance Equipment used: None, 1 person hand held assist Transfers: Sit to/from Stand Sit to Stand:  Supervision                 Balance Overall balance assessment: Needs assistance Sitting-balance support: Feet supported Sitting balance-Leahy Scale: Good     Standing balance support: No upper extremity supported, Single extremity supported Standing balance-Leahy Scale: Fair                             ADL either performed or assessed with clinical judgement   ADL Overall ADL's : Needs assistance/impaired Eating/Feeding: Set up;Sitting   Grooming: Wash/dry hands;Wash/dry face;Set up;Sitting   Upper Body Bathing: Contact guard assist;Sitting   Lower Body Bathing: Minimal assistance;Sit to/from stand   Upper Body Dressing : Contact guard assist;Sitting   Lower Body Dressing: Minimal assistance;Sit to/from stand;Sitting/lateral leans   Toilet Transfer: Contact guard assist;Ambulation   Toileting- Clothing Manipulation and Hygiene: Minimal assistance Toileting - Clothing Manipulation Details (indicate cue type and reason): for throughness needs extra assist     Functional mobility during ADLs: Contact guard assist      Extremity/Trunk Assessment Upper Extremity Assessment Upper Extremity Assessment: Generalized weakness   Lower Extremity Assessment Lower Extremity Assessment: Defer to PT evaluation   Cervical / Trunk Assessment Cervical / Trunk Assessment: Kyphotic    Vision Baseline Vision/History: 0 No visual deficits Ability to See in Adequate Light: 0 Adequate Vision Assessment?: No apparent visual deficits   Perception Perception Perception: Not tested   Praxis Praxis Praxis: Not tested    Cognition Arousal: Alert Behavior During Therapy: Impulsive Overall Cognitive Status: No family/caregiver present to determine baseline cognitive functioning Area of Impairment: Orientation, Memory, Following commands, Safety/judgement, Awareness, Problem solving  Orientation Level: Disoriented to, Place, Time, Situation    Memory: Decreased short-term memory Following Commands: Follows one step commands inconsistently Safety/Judgement: Decreased awareness of safety, Decreased awareness of deficits   Problem Solving: Slow processing General Comments: He reported about being at the hospital before but did not report they were at the hospital right now and just wants to feel better and get rid of their cough.        Exercises      Shoulder Instructions       General Comments      Pertinent Vitals/ Pain       Pain Assessment Pain Assessment: No/denies pain Faces Pain Scale: No hurt Breathing: normal Negative Vocalization: none Facial Expression: smiling or inexpressive Body Language: relaxed Consolability: no need to console PAINAD Score: 0  Home Living                                          Prior Functioning/Environment              Frequency  Min 1X/week        Progress Toward Goals  OT Goals(current goals can now be found in the care plan section)     Acute Rehab OT Goals Patient Stated Goal: to get rid of this cough OT Goal Formulation: With patient Time For Goal Achievement: 04/29/23 Potential to Achieve Goals: Good  Plan      Co-evaluation                 AM-PAC OT "6 Clicks" Daily Activity     Outcome Measure   Help from another person eating meals?: A Little Help from another person taking care of personal grooming?: A Little Help from another person toileting, which includes using toliet, bedpan, or urinal?: A Little Help from another person bathing (including washing, rinsing, drying)?: A Little Help from another person to put on and taking off regular upper body clothing?: A Little Help from another person to put on and taking off regular lower body clothing?: A Little 6 Click Score: 18    End of Session Equipment Utilized During Treatment: Gait belt  OT Visit Diagnosis: Unsteadiness on feet (R26.81);Other abnormalities of  gait and mobility (R26.89);Muscle weakness (generalized) (M62.81)   Activity Tolerance Patient tolerated treatment well   Patient Left in bed;with call bell/phone within reach;with bed alarm set   Nurse Communication Mobility status        Time: 0720-0750 OT Time Calculation (min): 30 min  Charges: OT General Charges $OT Visit: 1 Visit OT Treatments $Self Care/Home Management : 23-37 mins  Presley Raddle OTR/L  Acute Rehab Services  [phone] office number   Alphia Moh 04/18/2023, 8:08 AM

## 2023-04-18 NOTE — Assessment & Plan Note (Signed)
04-18-2023 continue supportive care. Pt completed 5 days of abx.  04-19-2023 resolved.

## 2023-04-18 NOTE — Assessment & Plan Note (Addendum)
04-18-2023 continue protonix 40 mg daily. 04-19-2023 stable.

## 2023-04-18 NOTE — Progress Notes (Signed)
Mobility Specialist: Progress Note   04/18/23 1221  Mobility  Activity Ambulated with assistance in hallway  Level of Assistance Contact guard assist, steadying assist  Assistive Device Front wheel walker  Distance Ambulated (ft) 200 ft  Activity Response Tolerated well  Mobility Referral Yes  Mobility visit 1 Mobility  Mobility Specialist Start Time (ACUTE ONLY) 0905  Mobility Specialist Stop Time (ACUTE ONLY) 0921  Mobility Specialist Time Calculation (min) (ACUTE ONLY) 16 min    Pre Mobility: SpO2 96% 2LO2 During Mobility: SpO2 86-87% RA, 88-91% 2LO2 Post Mobility: SpO2 90% 2LO2  Pt was agreeable to mobility session - received in bed. Cg throughout w/o AD. Desat on RA - SpO2 86-87% RA. Pt was titrated to 2LO2 and SpO2 maintained 88-91% 2LO2 throughout the remainder of ambulation. Returned to room without fault. Left in bed with all needs met on 2LO2 - SpO2 90%. Bed alarm and restraints on.   Maurene Capes Mobility Specialist Please contact via SecureChat or Rehab office at [phone]

## 2023-04-18 NOTE — Subjective & Objective (Addendum)
Pt seen and examined.  Stable. On RA. Potential SNF bed today.

## 2023-04-18 NOTE — TOC Progression Note (Addendum)
Transition of Care [facility]) - Progression Note    Patient Details  Name: Anthony Ball MRN: [id number] Date of Birth: 1934/12/21  [facility] CM/SW Contact  Fredrik Mogel A [place], [place] Phone Number: 04/18/2023, 11:35 AM  Clinical Narrative:     Update 1630: CSW received call from Sheldon, pt's sister requesting CSW reach out to [employer], CSW left message with Tiffany requesting review for placement. CSW awaiting reply for decision. CSW was provided with contact information for pt's niece, Jodene Nam, [phone], who will also be assisting pt.   CSW met with pt and pt's sister Okey Regal and Bonita Quin at bedside. They stated that wanted to think on bed offers and decide. CSW to follow up again this afternoon for placement decision.    TOC will continue to follow.  Expected Discharge Plan: Skilled Nursing Facility Barriers to Discharge: Insurance Authorization, SNF Pending bed offer, Continued Medical Work up  Expected Discharge Plan and Services In-house Referral: Clinical Social Work     Living arrangements for the past 2 months: Single Family Home                                       Social Determinants of Health (SDOH) Interventions SDOH Screenings   Food Insecurity: No Food Insecurity (04/13/2023)  Housing: Low Risk  (04/13/2023)  Transportation Needs: No Transportation Needs (04/13/2023)  Utilities: Not At Risk (04/13/2023)  Social Connections: Unknown (04/13/2023)  Tobacco Use: Unknown (04/13/2023)    Readmission Risk Interventions     No data to display

## 2023-04-19 ENCOUNTER — Inpatient Hospital Stay (HOSPITAL_COMMUNITY): Payer: Medicare Other

## 2023-04-19 DIAGNOSIS — R1311 Dysphagia, oral phase: Principal | ICD-10-CM | POA: Diagnosis present

## 2023-04-19 DIAGNOSIS — J189 Pneumonia, unspecified organism: Secondary | ICD-10-CM | POA: Diagnosis not present

## 2023-04-19 DIAGNOSIS — R1310 Dysphagia, unspecified: Secondary | ICD-10-CM

## 2023-04-19 DIAGNOSIS — N201 Calculus of ureter: Secondary | ICD-10-CM | POA: Diagnosis not present

## 2023-04-19 DIAGNOSIS — J449 Chronic obstructive pulmonary disease, unspecified: Secondary | ICD-10-CM | POA: Diagnosis not present

## 2023-04-19 DIAGNOSIS — E039 Hypothyroidism, unspecified: Secondary | ICD-10-CM

## 2023-04-19 MED ORDER — IPRATROPIUM-ALBUTEROL 0.5-2.5 (3) MG/3ML IN SOLN
3.0000 mL | RESPIRATORY_TRACT | Status: DC | PRN
  Administered 2023-04-19: 3 mL via RESPIRATORY_TRACT

## 2023-04-19 NOTE — Progress Notes (Signed)
Physical Therapy Treatment Patient Details Name: Anthony Ball MRN: [id number] DOB: 12-08-1934 Today's Date: 04/19/2023   History of Present Illness Anthony Ball is a 89 y.o. male  patient to hospital with shortness of breath and productive cough; influenza a with pneumonia, partial SBO with history of COPD, dementia, hypothyroidism    PT Comments  Pt presents sitting up in bed finishing breakfast and agreeable to therapy.  Pt using electric razor from family.  Pt transfers to EOB w/ mod I (although bed almost at 90 degrees) and scoots to EOB.  Pt does attempt to stand right away but cued to wait.  Pt amb 100+ w/ RW and CGA only w/ cues for walker management and posture.  Pt assisted w/ change of soiled gown in standing.  Pt amb trial 2 same distance w/ turns to return to seat.  PT fixed recliner for use and pt amb to recliner w/ CGA but increased cues for foot clearance during turn to recliner.  Pt remained sitting in recliner w/ chair alarm on and all needs in reach.    If plan is discharge home, recommend the following: A little help with walking and/or transfers;A lot of help with bathing/dressing/bathroom;Direct supervision/assist for financial management;Assistance with cooking/housework;Assist for transportation;Supervision due to cognitive status;Help with stairs or ramp for entrance   Can travel by private vehicle        Equipment Recommendations       Recommendations for Other Services       Precautions / Restrictions Precautions Precautions: Fall Precaution Comments: watch O2 sats on room air Restrictions Weight Bearing Restrictions Per Provider Order: No     Mobility  Bed Mobility Overal bed mobility: Modified Independent Bed Mobility: Supine to Sit, Sit to Supine     Supine to sit: Modified independent (Device/Increase time) Sit to supine: Modified independent (Device/Increase time)        Transfers Overall transfer level: Needs assistance   Transfers: Sit  to/from Stand Sit to Stand: Supervision           General transfer comment: cues for hand placement w/ transfers.    Ambulation/Gait Ambulation/Gait assistance: Contact guard assist Gait Distance (Feet): 100 Feet Assistive device: Rolling walker (2 wheels) Gait Pattern/deviations: Step-through pattern, Decreased step length - right, Decreased step length - left, Trunk flexed (improved w/ cues.)               Modified Rankin (Stroke Patients Only)       Balance   Sitting-balance support: Feet supported Sitting balance-Leahy Scale: Good     Standing balance support: Bilateral upper extremity supported Standing balance-Leahy Scale: Fair                              Cognition Arousal: Alert Behavior During Therapy: Impulsive                             Safety/Judgement: Decreased awareness of safety, Decreased awareness of deficits              Exercises      General Comments        Pertinent Vitals/Pain Pain Assessment Pain Assessment: No/denies pain    Home Living                          Prior Function  PT Goals (current goals can now be found in the care plan section) Acute Rehab PT Goals Time For Goal Achievement: 04/29/23 Potential to Achieve Goals: Good Progress towards PT goals: Progressing toward goals    Frequency           PT Plan      Co-evaluation              AM-PAC PT "6 Clicks" Mobility   Outcome Measure  Help needed turning from your back to your side while in a flat bed without using bedrails?: None Help needed moving from lying on your back to sitting on the side of a flat bed without using bedrails?: None Help needed moving to and from a bed to a chair (including a wheelchair)?: A Little Help needed standing up from a chair using your arms (e.g., wheelchair or bedside chair)?: A Little Help needed to walk in hospital room?: A Little Help needed climbing 3-5  steps with a railing? : A Lot 6 Click Score: 19    End of Session Equipment Utilized During Treatment: Gait belt Activity Tolerance: Patient tolerated treatment well Patient left: in chair;with call bell/phone within reach;with chair alarm set Nurse Communication: Mobility status PT Visit Diagnosis: Unsteadiness on feet (R26.81);Other abnormalities of gait and mobility (R26.89);Muscle weakness (generalized) (M62.81)     Time: 7829-5621 PT Time Calculation (min) (ACUTE ONLY): 27 min  Charges:    $Gait Training: 8-22 mins $Therapeutic Activity: 8-22 mins PT General Charges $$ ACUTE PT VISIT: 1 Visit                     Lucio Edward, PT    Lucio Edward 04/19/2023, 9:51 AM

## 2023-04-19 NOTE — Progress Notes (Signed)
PROGRESS NOTE    Anthony Ball  QIO:[id number] DOB: [birth year] DOA: 04/12/2023 PCP: Anthony Housekeeper, MD  Subjective: Pt seen and examined.  Had MBS. Placed on dysphagia-2 diet(chopped diet) with nectar thick liquids.  Met with both his sisters at bedside. CM/SW to talk to sisters about DC to home vs Snf.   Hospital Course: HPI: Anthony Ball is a 89 y.o. male with history of COPD, dementia, hypothyroidism who was recently evaluated by cardiologist for chest pain and had stress test on 04/02/2013 which was unremarkable with normal LV was brought to the ER after patient was complaining of shortness of breath and productive cough.  Patient has dementia and is not able to give good history.  I was able to reach one of the sisters of the patient but they have requested to contact another sister by the name Anthony Ball who is not at this time reachable.   ED Course: In the ER patient was hypoxic requiring 2 L oxygen with chest x-ray showing concerning features for infiltrates WBC count was 19,000.  Patient was started on empiric antibiotics for pneumonia.  At the time of my exam patient started having multiple episodes of vomiting and abdomen appeared distended.  I ordered a stat CT abdomen pelvis CT chest.  CT abdomen pelvis shows features concerning for bowel obstruction and CT chest shows pneumonia.  Also shows features of right ureteric stone.  Significant Events: Admitted 04/12/2023 for SBO, pneumonia, influenza A 04-13-2023 pt seen by general surgery. NG tube inserted 04-14-2023 pt pulled out his NG tube 04-17-2023 resolution of SBO vs ileus  Significant Labs: Positive for Influenza A WBC 19.4, Hgb 16, Plt 322 Na 136, K 3.8, BUN 30, Scr 1.12, glu 125  Significant Imaging Studies: CXR shows Right lower lobe airspace opacity concerning for pneumonia.  CT chest/abd/pelvis shows Diffuse small bowel dilatation except for small caliber segments in the right lower quadrant, maximum small bowel  caliber 3.7 cm. A transitional segment was not found but the difference in caliber is more suggestive of a distal small bowel obstruction than ileus. Etiology is undetermined. 2. Small volume of perihepatic and pelvic ascites. No pneumatosis, free air or free hemorrhage. 3. Bilateral lower lobe consolidation with air bronchograms, right greater than left, which could be due to atelectasis,  pneumonia or aspiration. 4. Additional tree-in-bud interstitial micronodularity in the superior segments of both lower lobes, consistent with infectious or inflammatory bronchiolitis. 5. Diffuse esophageal wall thickening including over the GE junction. Endoscopy recommended. Small hiatal hernia.  6. Aortic and coronary artery atherosclerosis. 7. 5 mm in length by 2 mm in diameter rod-like right UPJ stone with mild hydronephrosis. 8. Nonobstructive bilateral micronephrolithiasis. 9. Prostatomegaly, transverse axis 6 cm. 10. Colonic diverticulosis without evidence of diverticulitis. 11. Osteopenia and degenerative change.  Antibiotic Therapy: Anti-infectives (From admission, onward)    Start     Dose/Rate Route Frequency Ordered Stop   04/14/23 0000  azithromycin (ZITHROMAX) 500 mg in sodium chloride 0.9 % 250 mL IVPB        500 mg 250 mL/hr over 60 Minutes Intravenous Every 24 hours 04/13/23 0541 04/18/23 0110   04/13/23 2200  cefTRIAXone (ROCEPHIN) 2 g in sodium chloride 0.9 % 100 mL IVPB        2 g 200 mL/hr over 30 Minutes Intravenous Every 24 hours 04/13/23 0541 04/17/23 2317   04/12/23 2345  cefTRIAXone (ROCEPHIN) 1 g in sodium chloride 0.9 % 100 mL IVPB  1 g 200 mL/hr over 30 Minutes Intravenous  Once 04/12/23 2337 04/13/23 0034   04/12/23 2345  azithromycin (ZITHROMAX) 500 mg in sodium chloride 0.9 % 250 mL IVPB        500 mg 250 mL/hr over 60 Minutes Intravenous  Once 04/12/23 2337 04/13/23 0102       Procedures:   Consultants: Urology General surgery    Assessment and Plan: * CAP  (community acquired pneumonia) 04-18-2023 pt has completed 5 days of rocephin/zithromax. Last dose on 04-18-2023. 04-19-2023 resolved.  Dysphagia 04-19-2023 seen by ST. Had MBS. Placed on dysphagia-2(chopped diet) with nectar thick liquids.  Influenza A with pneumonia 04-18-2023 continue supportive care. Pt completed 5 days of abx.  04-19-2023 resolved.  Acute respiratory failure with hypoxia (HCC) 04-18-2023 pt needed supplemental O2 with walking. May need home O2.  04-19-2023 on RA at rest. did not require supplemental O2 with ambulation today. Walked 200 feet.  Ureteral stone 04-18-2023 urology not going to intervene. Continue flomax.  04-19-2023 stable.  SBO (small bowel obstruction) (HCC) 04-18-2023 KUB from yesterday shows contrast in colon. Pt did some coughing with swallowing water. Will consult ST to assess for dysphagia. General surgery has signed off. 04-19-2023 resolved.  GERD (gastroesophageal reflux disease) 04-18-2023 continue protonix. 04-19-2023 stable.  Dementia (HCC) 04-18-2023 chronic. 04-19-2023 stable.  Hypothyroidism 04-18-2023 stable on synthroid 25 mcg daily. 04-19-2023 stable.  COPD (chronic obstructive pulmonary disease) (HCC) 04-18-2023 start qid nebs due to hypoxia.  04-19-2023 stable. Continue with nebs   DVT prophylaxis: SCDs Start: 04/13/23 0540    Code Status: Full Code Family Communication: discussed with pt's two sisters at bedside Disposition Plan: SNF vs home Reason for continuing need for hospitalization: medically stable for DC  Objective: Vitals:   04/18/23 2108 04/19/23 0609 04/19/23 0727 04/19/23 0944  BP: (!) 115/59 117/60    Pulse: 79 71    Resp: 16 20    Temp: 98.2 F (36.8 C) 98.4 F (36.9 C)    TempSrc: Oral Oral    SpO2: 93% 94% 93% 93%  Weight:      Height:        Intake/Output Summary (Last 24 hours) at 04/19/2023 1426 Last data filed at 04/19/2023 1415 Gross per 24 hour  Intake 118 ml  Output 550 ml   Net -432 ml   Filed Weights   04/12/23 1757 04/13/23 1725  Weight: 61 kg 56.2 kg    Examination:  Physical Exam Vitals and nursing note reviewed.  HENT:     Head: Normocephalic and atraumatic.     Nose: Nose normal.  Eyes:     General: No scleral icterus. Cardiovascular:     Rate and Rhythm: Normal rate and regular rhythm.  Pulmonary:     Effort: Pulmonary effort is normal.     Breath sounds: Normal breath sounds.  Abdominal:     General: Bowel sounds are normal. There is no distension.     Palpations: Abdomen is soft.  Musculoskeletal:     Right lower leg: No edema.  Skin:    General: Skin is warm and dry.     Capillary Refill: Capillary refill takes less than 2 seconds.  Neurological:     Mental Status: He is alert. He is disoriented.     Data Reviewed: I have personally reviewed following labs and imaging studies  CBC: Recent Labs  Lab 04/12/23 1850 04/12/23 1900 04/13/23 0611 04/14/23 0945 04/15/23 0855 04/17/23 0616 04/18/23 0625  WBC 19.4*  --  22.5*  18.4* 13.5* 9.5 8.6  NEUTROABS 16.0*  --  21.3*  --   --   --   --   HGB 16.0   < > 15.0 13.4 13.0 12.9* 13.6  HCT 47.8   < > 44.3 40.8 39.9 38.6* 40.3  MCV 87.1  --  86.2 87.2 87.7 87.1 86.5  PLT 322  --  320 332 378 403* 409*   < > = values in this interval not displayed.   Basic Metabolic Panel: Recent Labs  Lab 04/13/23 0611 04/14/23 0945 04/15/23 0855 04/17/23 0616 04/18/23 0625  NA 138 144 146* 141 137  K 3.7 3.3* 3.3* 3.3* 3.7  CL 99 105 110 109 102  CO2 22 26 26 24 24   GLUCOSE 164* 109* 116* 112* 87  BUN 29* 29* 22 14 7*  CREATININE 1.01 1.15 0.96 0.70 0.71  CALCIUM 9.3 9.1 8.6* 8.3* 8.5*  MG  --  2.3  --  2.2 2.0  PHOS  --  2.3*  --   --  2.8   GFR: Estimated Creatinine Clearance: 52.7 mL/min (by C-G formula based on SCr of 0.71 mg/dL). Liver Function Tests: Recent Labs  Lab 04/12/23 1850 04/13/23 0611  AST 28 45*  ALT 14 18  ALKPHOS 71 70  BILITOT 0.9 0.8  PROT 6.6 6.5   ALBUMIN 3.2* 2.9*   BNP (last 3 results) Recent Labs    04/12/23 1850  BNP 45.9   CBG: Recent Labs  Lab 04/14/23 0610 04/14/23 0809 04/14/23 1229 04/15/23 0015 04/15/23 0647  GLUCAP 103* 105* 102* 94 102*   Sepsis Labs: Recent Labs  Lab 04/12/23 1903 04/12/23 2134 04/13/23 0709  PROCALCITON  --   --  0.44  LATICACIDVEN 2.9* 1.6  --     Recent Results (from the past 240 hours)  Resp panel by RT-PCR (RSV, Flu A&B, Covid) Anterior Nasal Swab     Status: Abnormal   Collection Time: 04/12/23  6:57 PM   Specimen: Anterior Nasal Swab  Result Value Ref Range Status   SARS Coronavirus 2 by RT PCR NEGATIVE NEGATIVE Final   Influenza A by PCR POSITIVE (A) NEGATIVE Final   Influenza B by PCR NEGATIVE NEGATIVE Final    Comment: (NOTE) The Xpert Xpress SARS-CoV-2/FLU/RSV plus assay is intended as an aid in the diagnosis of influenza from Nasopharyngeal swab specimens and should not be used as a sole basis for treatment. Nasal washings and aspirates are unacceptable for Xpert Xpress SARS-CoV-2/FLU/RSV testing.  Fact Sheet for Patients: BloggerCourse.com  Fact Sheet for Healthcare Providers: SeriousBroker.it  This test is not yet approved or cleared by the [place] FDA and has been authorized for detection and/or diagnosis of SARS-CoV-2 by FDA under an Emergency Use Authorization (EUA). This EUA will remain in effect (meaning this test can be used) for the duration of the COVID-19 declaration under Section 564(b)(1) of the Act, 21 U.S.C. section 360bbb-3(b)(1), unless the authorization is terminated or revoked.     Resp Syncytial Virus by PCR NEGATIVE NEGATIVE Final    Comment: (NOTE) Fact Sheet for Patients: BloggerCourse.com  Fact Sheet for Healthcare Providers: SeriousBroker.it  This test is not yet approved or cleared by the [place] FDA and has been  authorized for detection and/or diagnosis of SARS-CoV-2 by FDA under an Emergency Use Authorization (EUA). This EUA will remain in effect (meaning this test can be used) for the duration of the COVID-19 declaration under Section 564(b)(1) of the Act, 21 U.S.C. section 360bbb-3(b)(1), unless  the authorization is terminated or revoked.  Performed at [facility], [address] [place], [place] [address]   Culture, blood (routine x 2)     Status: None   Collection Time: 04/12/23 11:37 PM   Specimen: BLOOD  Result Value Ref Range Status   Specimen Description BLOOD SITE NOT SPECIFIED  Final   Special Requests   Final    BOTTLES DRAWN AEROBIC AND ANAEROBIC Blood Culture adequate volume   Culture   Final    NO GROWTH 5 DAYS Performed at [facility], [address], [place], [place] [address]    Report Status 04/18/2023 FINAL  Final  Culture, blood (routine x 2)     Status: None   Collection Time: 04/12/23 11:58 PM   Specimen: BLOOD RIGHT ARM  Result Value Ref Range Status   Specimen Description BLOOD RIGHT ARM  Final   Special Requests   Final    BOTTLES DRAWN AEROBIC AND ANAEROBIC Blood Culture adequate volume   Culture   Final    NO GROWTH 5 DAYS Performed at [facility], [address] [place], [place] [address]    Report Status 04/18/2023 FINAL  Final  Urine Culture (for pregnant, neutropenic or urologic patients or patients with an indwelling urinary catheter)     Status: None   Collection Time: 04/13/23  8:26 AM   Specimen: Urine, Clean Catch  Result Value Ref Range Status   Specimen Description URINE, CLEAN CATCH  Final   Special Requests NONE  Final   Culture   Final    NO GROWTH Performed at [facility], [address], [place], [place] [address]    Report Status 04/14/2023 FINAL  Final     Radiology Studies: DG CHEST PORT 1 VIEW Result Date: 04/19/2023 CLINICAL DATA:  Shortness of breath. EXAM: PORTABLE CHEST 1 VIEW COMPARISON:  CT  chest dated April 13, 2023. Chest radiograph dated April 12, 2023. FINDINGS: The heart size and mediastinal contours are within normal limits. Bibasilar, right-greater-than-left, airspace opacities, increased on the left compared to the prior radiograph dated 04/12/2023. No pleural effusion or pneumothorax. No acute osseous abnormality. IMPRESSION: Bibasilar right-greater-than-left airspace opacities, increased at the left lung base. Electronically Signed   By: Hart Robinsons M.D.   On: 04/19/2023 08:18    Scheduled Meds:  doxazosin  1 mg Oral Daily   levothyroxine  25 mcg Oral QAC breakfast   memantine  10 mg Oral Daily   pantoprazole  40 mg Oral Daily   QUEtiapine  25 mg Oral BID   Continuous Infusions:   LOS: 6 days   Time spent: 40 minutes  Carollee Herter, DO  [facility]  04/19/2023, 2:26 PM

## 2023-04-19 NOTE — Progress Notes (Signed)
Modified Barium Swallow Study  Patient Details  Name: Anthony Ball MRN: [id number] Date of Birth: Jun 30, 1934  Today's Date: 04/19/2023  Modified Barium Swallow completed.  Full report located under Chart Review in the Imaging Section.  History of Present Illness Anthony Ball is a 89 y.o. male with history of COPD, dementia, hypothyroidism who was recently evaluated by cardiologist for chest pain and had stress test on 04/02/2013 which was unremarkable with normal LV was brought to the ER after patient was complaining of shortness of breath and productive cough.  In the ER patient was hypoxic requiring 2 L oxygen with chest x-ray showing concerning features for infiltrates  Patient was started on empiric antibiotics for pneumonia. Found to have SBO resolved per MD noted 1/28. MD note says pt "ddid cough some with drinking water."   Clinical Impression Oral dysphagia marked by decreased oral cohesion with posterior spill to pharynx, delayed transit, minimal lingual residue and slow and prolonged mastication with solid. There was premature spill of thin liquid that fell into pt's trachea prior to swallow initiating which was sensed with immediate cough not clearing the airway (PAS 7). Chin tuck posture reduced to material penetrating above vocal cords (PAS 3). In addition he exhibited reduced tongue base retraction, anterior hyoid excursion leading to vallecular and pyriform sinus residue. There was partial pharyngoesophageal segment distention with partial obstruction of flow. Cues to swallow a second time did reduce residue to mild. Nectar thick liquid was penetrated but ejected during the swallow (PAS 2) and one instance nectar residue from pharynx spilled into vestibule above vocal cords with spontaneous cough to clear. Recommend Dys 2 (minced/fine chopped), nectar thick liquids, swallow twice, clear throat during meals and crush meds. Pt will need full supervision/assist to follow strategies due to  history of dementia. ST will continue to follow. Factors that may increase risk of adverse event in presence of aspiration Rubye Oaks & Clearance Coots 2021): Reduced cognitive function  Swallow Evaluation Recommendations Recommendations: PO diet PO Diet Recommendation: Dysphagia 2 (Finely chopped);Mildly thick liquids (Level 2, nectar thick) Liquid Administration via: Cup;Straw Medication Administration: Crushed with puree Supervision: Patient able to self-feed;Full supervision/cueing for swallowing strategies Swallowing strategies  : Small bites/sips;Slow rate;Multiple dry swallows after each bite/sip;Clear throat intermittently Postural changes: Position pt fully upright for meals Oral care recommendations: Oral care BID (2x/day)      Royce Macadamia 04/19/2023,3:03 PM

## 2023-04-19 NOTE — Assessment & Plan Note (Addendum)
04-19-2023 seen by ST. Had MBS. Placed on dysphagia-2(chopped diet) with nectar thick liquids.  Swallow Evaluation Recommendations Recommendations: PO diet PO Diet Recommendation: Dysphagia 2 (Finely chopped);Mildly thick liquids (Level 2, nectar thick) Liquid Administration via: Cup;Straw Medication Administration: Crushed with puree Supervision: Patient able to self-feed;Full supervision/cueing for swallowing strategies Swallowing strategies  : Small bites/sips;Slow rate;Multiple dry swallows after each bite/sip;Clear throat intermittently Postural changes: Position pt fully upright for meals Oral care recommendations: Oral care BID (2x/day)

## 2023-04-19 NOTE — TOC Progression Note (Addendum)
Transition of Care [facility]) - Progression Note    Patient Details  Name: Anthony Ball MRN: [id number] Date of Birth: December 31, 1934  Transition [facility]) CM/SW Contact  Lynell Greenhouse A [place], [place] Phone Number: 04/19/2023, 3:37 PM  Clinical Narrative:      Update 1554 CSW received contact from [facility] Community [facility] transitions for [facility] that pt's authorization is under medical review. Auth status remains pending.    Update 04/20/23 CSW was contacted by Dennie Bible at [facility], they stated that they are able to offer bed to pt. CSW to start insurance authorization and bed available at facility, can DC over the weekend. POC is Dennie Bible, [phone]. CSW contacted pt's sister Bonita Quin and updated on discharge plan. Auth ID: [id number], insurance authorization status pending.      CSW met with pt's sister's Bonita Quin and Eber Jones to discuss placement decision for SNF. They stated that they wanted pt to go to [facility] in [place], [place], close to [place]. If pt is unable to be placed at facility,request for [facility], but they are declined. [facility] is third option for placement.   CSW sent out clinical request to [facility] for pt to [phone]. CSW left HIPAA compliant VM to Dennie Bible, liaison at facility, [phone].  CSW reached back out and confirmed fax was received.   CSW to reach back out to pt's family once bed decision has been made by facility.   TOC will continue to follow.     Expected Discharge Plan: Skilled Nursing Facility Barriers to Discharge: Insurance Authorization, SNF Pending bed offer, Continued Medical Work up  Expected Discharge Plan and Services In-house Referral: Clinical Social Work     Living arrangements for the past 2 months: Single Family Home                                       Social Determinants of Health (SDOH) Interventions SDOH Screenings   Food Insecurity: No Food Insecurity (04/13/2023)  Housing: Low Risk  (04/13/2023)   Transportation Needs: No Transportation Needs (04/13/2023)  Utilities: Not At Risk (04/13/2023)  Social Connections: Unknown (04/13/2023)  Tobacco Use: Unknown (04/13/2023)    Readmission Risk Interventions     No data to display

## 2023-04-19 NOTE — Progress Notes (Signed)
Mobility Specialist: Progress Note   04/19/23 1203  Mobility  Activity Ambulated with assistance in hallway  Level of Assistance Contact guard assist, steadying assist  Assistive Device None  Distance Ambulated (ft) 200 ft  Activity Response Tolerated well  Mobility Referral Yes  Mobility visit 1 Mobility  Mobility Specialist Start Time (ACUTE ONLY) 1130  Mobility Specialist Stop Time (ACUTE ONLY) 1145  Mobility Specialist Time Calculation (min) (ACUTE ONLY) 15 min    Pt was agreeable to mobility session - received in chair. No complaints just cough/spitting up a lot. CG throughout. SpO2 WFL on RA. Returned to room without fault. Left in bed with all needs met, call bell in reach. Bed alarm on.   Maurene Capes Mobility Specialist Please contact via SecureChat or Rehab office at [phone]

## 2023-04-19 NOTE — Evaluation (Signed)
Clinical/Bedside Swallow Evaluation Patient Details  Name: Anthony Ball MRN: [id number] Date of Birth: 28-Jun-1934  Today's Date: 04/19/2023 Time: SLP Start Time (ACUTE ONLY): 4098 SLP Stop Time (ACUTE ONLY): 0830 SLP Time Calculation (min) (ACUTE ONLY): 16 min  Past Medical History:  Past Medical History:  Diagnosis Date   COPD (chronic obstructive pulmonary disease) (HCC)    Emphysema    GERD (gastroesophageal reflux disease)    Seasonal allergies    Past Surgical History:  Past Surgical History:  Procedure Laterality Date   CARDIAC CATHETERIZATION     PROSTATE SURGERY     HPI:  Anthony Ball is a 89 y.o. male with history of COPD, dementia, hypothyroidism who was recently evaluated by cardiologist for chest pain and had stress test on 04/02/2013 which was unremarkable with normal LV was brought to the ER after patient was complaining of shortness of breath and productive cough.  In the ER patient was hypoxic requiring 2 L oxygen with chest x-ray showing concerning features for infiltrates  Patient was started on empiric antibiotics for pneumonia. Found to have SBO resolved per MD noted 1/28. MD note says pt "ddid cough some with drinking water."    Assessment / Plan / Recommendation  Clinical Impression  Pt alert and states he sometimes coughs when eating/drinking. He has upper denture plate,is misssing posterior dentition and has a strong volitional cough with adequate oromotor abilitities. He exhibited indications of possible pharyngeal dysphagia noted by multiple swallows, cough after solid x 1 and consistent throat clearing throughout evaluation. MBS is scheduled for 1:00 today. He may continue current diet/liquids until then. Recommend pills whole in applesauce. SLP Visit Diagnosis: Dysphagia, unspecified (R13.10)    Aspiration Risk  Mild aspiration risk    Diet Recommendation Other (Comment);Thin liquid (soft - may make other recommendations after the MBS)    Liquid  Administration via: Cup;Straw Medication Administration: Whole meds with puree Supervision: Intermittent supervision to cue for compensatory strategies Compensations: Slow rate;Small sips/bites Postural Changes: Seated upright at 90 degrees    Other  Recommendations Oral Care Recommendations: Oral care BID    Recommendations for follow up therapy are one component of a multi-disciplinary discharge planning process, led by the attending physician.  Recommendations may be updated based on patient status, additional functional criteria and insurance authorization.  Follow up Recommendations        Assistance Recommended at Discharge    Functional Status Assessment    Frequency and Duration            Prognosis        Swallow Study   General Date of Onset: 04/19/23 HPI: Anthony Ball is a 89 y.o. male with history of COPD, dementia, hypothyroidism who was recently evaluated by cardiologist for chest pain and had stress test on 04/02/2013 which was unremarkable with normal LV was brought to the ER after patient was complaining of shortness of breath and productive cough.  In the ER patient was hypoxic requiring 2 L oxygen with chest x-ray showing concerning features for infiltrates  Patient was started on empiric antibiotics for pneumonia. Found to have SBO resolved per MD noted 1/28. MD note says pt "ddid cough some with drinking water." Type of Study: Bedside Swallow Evaluation Previous Swallow Assessment:  (none) Diet Prior to this Study: Other (Comment);Thin liquids (Level 0) (soft) Temperature Spikes Noted: No Respiratory Status: Room air History of Recent Intubation: No Behavior/Cognition: Alert;Cooperative;Pleasant mood Oral Cavity Assessment: Within Functional Limits Oral Care Completed by SLP:  No Oral Cavity - Dentition: Dentures, top;Missing dentition (missing some lower posterior and anterior, poor condition) Vision: Functional for self-feeding Self-Feeding Abilities: Able  to feed self Patient Positioning: Upright in bed Baseline Vocal Quality: Normal Volitional Cough: Strong Volitional Swallow: Able to elicit    Oral/Motor/Sensory Function Overall Oral Motor/Sensory Function: Within functional limits   Ice Chips Ice chips: Not tested   Thin Liquid Thin Liquid: Impaired Presentation: Straw;Cup Pharyngeal  Phase Impairments: Throat Clearing - Delayed    Nectar Thick Nectar Thick Liquid: Not tested   Honey Thick Honey Thick Liquid: Not tested   Puree Puree: Impaired Pharyngeal Phase Impairments: Multiple swallows   Solid     Solid: Impaired Pharyngeal Phase Impairments: Throat Clearing - Delayed;Multiple swallows;Cough - Immediate      Royce Macadamia 04/19/2023,8:53 AM

## 2023-04-19 NOTE — Plan of Care (Signed)
Problem: Education: Goal: Knowledge of General Education information will improve Description: Including pain rating scale, medication(s)/side effects and non-pharmacologic comfort measures Outcome: Progressing   Problem: Health Behavior/Discharge Planning: Goal: Ability to manage health-related needs will improve Outcome: Progressing   Problem: Clinical Measurements: Goal: Ability to maintain clinical measurements within normal limits will improve Outcome: Progressing Goal: Will remain free from infection Outcome: Progressing Goal: Diagnostic test results will improve Outcome: Progressing Goal: Respiratory complications will improve Outcome: Progressing Goal: Cardiovascular complication will be avoided Outcome: Progressing   Problem: Activity: Goal: Risk for activity intolerance will decrease Outcome: Progressing   Problem: Nutrition: Goal: Adequate nutrition will be maintained Outcome: Progressing   Problem: Coping: Goal: Level of anxiety will decrease Outcome: Progressing   Problem: Elimination: Goal: Will not experience complications related to bowel motility Outcome: Progressing Goal: Will not experience complications related to urinary retention Outcome: Progressing   Problem: Pain Managment: Goal: General experience of comfort will improve and/or be controlled Outcome: Progressing   Problem: Safety: Goal: Ability to remain free from injury will improve Outcome: Progressing   Problem: Skin Integrity: Goal: Risk for impaired skin integrity will decrease Outcome: Progressing   Problem: Activity: Goal: Ability to tolerate increased activity will improve Outcome: Progressing   Problem: Clinical Measurements: Goal: Ability to maintain a body temperature in the normal range will improve Outcome: Progressing   Problem: Respiratory: Goal: Ability to maintain adequate ventilation will improve Outcome: Progressing Goal: Ability to maintain a clear airway  will improve Outcome: Progressing   Problem: Safety: Goal: Non-violent Restraint(s) Outcome: Progressing

## 2023-04-20 ENCOUNTER — Inpatient Hospital Stay (HOSPITAL_COMMUNITY): Payer: Medicare Other

## 2023-04-20 DIAGNOSIS — J09X1 Influenza due to identified novel influenza A virus with pneumonia: Secondary | ICD-10-CM | POA: Diagnosis not present

## 2023-04-20 DIAGNOSIS — R1311 Dysphagia, oral phase: Secondary | ICD-10-CM

## 2023-04-20 DIAGNOSIS — J9601 Acute respiratory failure with hypoxia: Secondary | ICD-10-CM | POA: Diagnosis not present

## 2023-04-20 DIAGNOSIS — J189 Pneumonia, unspecified organism: Secondary | ICD-10-CM | POA: Diagnosis not present

## 2023-04-20 MED ORDER — ONDANSETRON HCL 4 MG PO TABS: 4.0000 mg | ORAL_TABLET | Freq: Four times a day (QID) | ORAL | Status: AC | PRN

## 2023-04-20 MED ORDER — QUETIAPINE FUMARATE 25 MG PO TABS: 25.0000 mg | ORAL_TABLET | Freq: Two times a day (BID) | ORAL | Status: AC

## 2023-04-20 MED ORDER — IPRATROPIUM-ALBUTEROL 0.5-2.5 (3) MG/3ML IN SOLN: 3.0000 mL | Freq: Four times a day (QID) | RESPIRATORY_TRACT | Status: AC

## 2023-04-20 MED ORDER — PANTOPRAZOLE SODIUM 40 MG PO TBEC: 40.0000 mg | DELAYED_RELEASE_TABLET | Freq: Every day | ORAL | Status: AC

## 2023-04-20 MED ORDER — TAMSULOSIN HCL 0.4 MG PO CAPS: 0.4000 mg | ORAL_CAPSULE | Freq: Every day | ORAL | Status: DC

## 2023-04-20 MED ORDER — ACETAMINOPHEN 325 MG PO TABS: 650.0000 mg | ORAL_TABLET | Freq: Four times a day (QID) | ORAL | Status: AC | PRN

## 2023-04-20 NOTE — Discharge Summary (Signed)
[facility] Physician Discharge Summary   Patient name: Anthony Ball  Admit date:     04/12/2023  Discharge date: 04/20/2023  Attending Physician: Eduard Clos [[id number]]  Discharge Physician: Carollee Herter   PCP: Anthony Housekeeper, MD  Admitted From: Home  Disposition:   [facility]  Recommendations for Outpatient Follow-up:  Follow up with PCP in 1-2 weeks  Home Health:No Equipment/Devices: None    Discharge Condition:Stable CODE STATUS:FULL Diet recommendation: Dysphagia. Finely Chopped diet with nectar thickened liquids. See ST instructions under "Oral Phase Dysphagia" Fluid Restriction: None  Hospital Summary: HPI: Anthony Ball is a 89 y.o. male with history of COPD, dementia, hypothyroidism who was recently evaluated by cardiologist for chest pain and had stress test on 04/02/2013 which was unremarkable with normal LV was brought to the ER after patient was complaining of shortness of breath and productive cough.  Patient has dementia and is not able to give good history.  I was able to reach one of the sisters of the patient but they have requested to contact another sister by the name Anthony Ball who is not at this time reachable.   ED Course: In the ER patient was hypoxic requiring 2 L oxygen with chest x-ray showing concerning features for infiltrates WBC count was 19,000.  Patient was started on empiric antibiotics for pneumonia.  At the time of my exam patient started having multiple episodes of vomiting and abdomen appeared distended.  I ordered a stat CT abdomen pelvis CT chest.  CT abdomen pelvis shows features concerning for bowel obstruction and CT chest shows pneumonia.  Also shows features of right ureteric stone.  Significant Events: Admitted 04/12/2023 for SBO, pneumonia, influenza A 04-13-2023 pt seen by general surgery. NG tube inserted 04-14-2023 pt pulled out his NG tube 04-17-2023 resolution of SBO vs ileus  Significant Labs: Positive  for Influenza A WBC 19.4, Hgb 16, Plt 322 Na 136, K 3.8, BUN 30, Scr 1.12, glu 125  Significant Imaging Studies: CXR shows Right lower lobe airspace opacity concerning for pneumonia.  CT chest/abd/pelvis shows Diffuse small bowel dilatation except for small caliber segments in the right lower quadrant, maximum small bowel caliber 3.7 cm. A transitional segment was not found but the difference in caliber is more suggestive of a distal small bowel obstruction than ileus. Etiology is undetermined. 2. Small volume of perihepatic and pelvic ascites. No pneumatosis, free air or free hemorrhage. 3. Bilateral lower lobe consolidation with air bronchograms, right greater than left, which could be due to atelectasis,  pneumonia or aspiration. 4. Additional tree-in-bud interstitial micronodularity in the superior segments of both lower lobes, consistent with infectious or inflammatory bronchiolitis. 5. Diffuse esophageal wall thickening including over the GE junction. Endoscopy recommended. Small hiatal hernia.  6. Aortic and coronary artery atherosclerosis. 7. 5 mm in length by 2 mm in diameter rod-like right UPJ stone with mild hydronephrosis. 8. Nonobstructive bilateral micronephrolithiasis. 9. Prostatomegaly, transverse axis 6 cm. 10. Colonic diverticulosis without evidence of diverticulitis. 11. Osteopenia and degenerative change.  Antibiotic Therapy: Anti-infectives (From admission, onward)    Start     Dose/Rate Route Frequency Ordered Stop   04/14/23 0000  azithromycin (ZITHROMAX) 500 mg in sodium chloride 0.9 % 250 mL IVPB        500 mg 250 mL/hr over 60 Minutes Intravenous Every 24 hours 04/13/23 0541 04/18/23 0110   04/13/23 2200  cefTRIAXone (ROCEPHIN) 2 g in sodium chloride 0.9 % 100 mL IVPB  2 g 200 mL/hr over 30 Minutes Intravenous Every 24 hours 04/13/23 0541 04/17/23 2317   04/12/23 2345  cefTRIAXone (ROCEPHIN) 1 g in sodium chloride 0.9 % 100 mL IVPB        1 g 200 mL/hr over 30  Minutes Intravenous  Once 04/12/23 2337 04/13/23 0034   04/12/23 2345  azithromycin (ZITHROMAX) 500 mg in sodium chloride 0.9 % 250 mL IVPB        500 mg 250 mL/hr over 60 Minutes Intravenous  Once 04/12/23 2337 04/13/23 0102       Procedures:   Consultants: Urology General surgery   Hospital Course by Problem: * CAP (community acquired pneumonia) 04-18-2023 pt has completed 5 days of rocephin/zithromax. Last dose on 04-18-2023. 04-19-2023 resolved.  Oral phase dysphagia 04-19-2023 seen by ST. Had MBS. Placed on dysphagia-2(chopped diet) with nectar thick liquids.  Swallow Evaluation Recommendations Recommendations: PO diet PO Diet Recommendation: Dysphagia 2 (Finely chopped);Mildly thick liquids (Level 2, nectar thick) Liquid Administration via: Cup;Straw Medication Administration: Crushed with puree Supervision: Patient able to self-feed;Full supervision/cueing for swallowing strategies Swallowing strategies  : Small bites/sips;Slow rate;Multiple dry swallows after each bite/sip;Clear throat intermittently Postural changes: Position pt fully upright for meals Oral care recommendations: Oral care BID (2x/day)  Influenza A with pneumonia 04-18-2023 continue supportive care. Pt completed 5 days of abx.  04-19-2023 resolved.  Acute respiratory failure with hypoxia (HCC) 04-18-2023 pt needed supplemental O2 with walking. May need home O2.  04-19-2023 on RA at rest. did not require supplemental O2 with ambulation today. Walked 200 feet.  Ureteral stone 04-18-2023 urology not going to intervene. Continue flomax.  04-19-2023 stable.  SBO (small bowel obstruction) (HCC) 04-18-2023 KUB from yesterday shows contrast in colon. Pt did some coughing with swallowing water. Will consult ST to assess for dysphagia. General surgery has signed off. 04-19-2023 resolved.  GERD (gastroesophageal reflux disease) 04-18-2023 continue protonix 40 mg daily. 04-19-2023 stable.  Dementia  (HCC) 04-18-2023 chronic. 04-19-2023 stable.  04-20-2023 chronic and stable.  Hypothyroidism 04-18-2023 stable on synthroid 25 mcg daily. 04-19-2023 stable. 04-20-2023 stable on 25 mcg synthroid daily.  COPD (chronic obstructive pulmonary disease) (HCC) 04-18-2023 start qid nebs due to hypoxia. 04-19-2023 stable. Continue with nebs  04-20-2023 continue with nebs qid. Stable.    Discharge Diagnoses:  Principal Problem:   CAP (community acquired pneumonia) Active Problems:   SBO (small bowel obstruction) (HCC)   Ureteral stone   Acute respiratory failure with hypoxia (HCC)   Influenza A with pneumonia   Oral phase dysphagia   COPD (chronic obstructive pulmonary disease) (HCC)   Hypothyroidism   Dementia (HCC)   GERD (gastroesophageal reflux disease)   Discharge Instructions  Discharge Instructions     Call MD for:  difficulty breathing, headache or visual disturbances   Complete by: As directed    Call MD for:  extreme fatigue   Complete by: As directed    Call MD for:  hives   Complete by: As directed    Call MD for:  persistant dizziness or light-headedness   Complete by: As directed    Call MD for:  persistant nausea and vomiting   Complete by: As directed    Call MD for:  redness, tenderness, or signs of infection (pain, swelling, redness, odor or green/yellow discharge around incision site)   Complete by: As directed    Call MD for:  severe uncontrolled pain   Complete by: As directed    Call MD for:  temperature >100.4  Complete by: As directed    DIET DYS 2   Complete by: As directed    Fine Chopped Diet   Fluid consistency: Nectar Thick   Discharge instructions   Complete by: As directed    1. Follow up with primary care provider in 1-2 weeks after discharge.   Increase activity slowly   Complete by: As directed       Allergies as of 04/20/2023   No Known Allergies      Medication List     STOP taking these medications    albuterol 108  (90 Base) MCG/ACT inhaler Commonly known as: VENTOLIN HFA   Fish Oil 1000 MG Caps   meloxicam 15 MG tablet Commonly known as: MOBIC   omeprazole 20 MG capsule Commonly known as: Oceanographer Calcium 500 MG Tabs   triamcinolone cream 0.1 % Commonly known as: KENALOG       TAKE these medications    acetaminophen 325 MG tablet Commonly known as: TYLENOL Take 2 tablets (650 mg total) by mouth every 6 (six) hours as needed for mild pain (pain score 1-3) (or Fever >/= 101).   ipratropium-albuterol 0.5-2.5 (3) MG/3ML Soln Commonly known as: DUONEB Take 3 mLs by nebulization in the morning, at noon, in the evening, and at bedtime.   levothyroxine 25 MCG tablet Commonly known as: SYNTHROID Take 25 mcg by mouth daily before breakfast.   memantine 10 MG tablet Commonly known as: NAMENDA Take 10 mg by mouth daily.   ondansetron 4 MG tablet Commonly known as: ZOFRAN Take 1 tablet (4 mg total) by mouth every 6 (six) hours as needed for nausea.   pantoprazole 40 MG tablet Commonly known as: PROTONIX Take 1 tablet (40 mg total) by mouth daily. Start taking on: April 21, 2023   QUEtiapine 25 MG tablet Commonly known as: SEROQUEL Take 1 tablet (25 mg total) by mouth 2 (two) times daily.   tamsulosin 0.4 MG Caps capsule Commonly known as: Flomax Take 1 capsule (0.4 mg total) by mouth daily.        No Known Allergies  Discharge Exam: Vitals:   04/20/23 0504 04/20/23 0849  BP: 127/75 114/64  Pulse: 72 67  Resp: 18   Temp: 98 F (36.7 C) (!) 97.5 F (36.4 C)  SpO2: 91% 91%    Physical Exam Vitals and nursing note reviewed.  HENT:     Head: Normocephalic and atraumatic.     Nose: Nose normal.  Eyes:     General: No scleral icterus. Cardiovascular:     Rate and Rhythm: Normal rate and regular rhythm.  Pulmonary:     Effort: Pulmonary effort is normal.     Breath sounds: Normal breath sounds.  Abdominal:     General: Bowel sounds are normal.  There is no distension.     Palpations: Abdomen is soft.  Musculoskeletal:     Right lower leg: No edema.  Skin:    General: Skin is warm and dry.     Capillary Refill: Capillary refill takes less than 2 seconds.  Neurological:     Mental Status: He is alert. He is disoriented.     Comments: Pleasantly demented     The results of significant diagnostics from this hospitalization (including imaging, microbiology, ancillary and laboratory) are listed below for reference.    Microbiology: Recent Results (from the past 240 hours)  Resp panel by RT-PCR (RSV, Flu A&B, Covid) Anterior Nasal Swab     Status:  Abnormal   Collection Time: 04/12/23  6:57 PM   Specimen: Anterior Nasal Swab  Result Value Ref Range Status   SARS Coronavirus 2 by RT PCR NEGATIVE NEGATIVE Final   Influenza A by PCR POSITIVE (A) NEGATIVE Final   Influenza B by PCR NEGATIVE NEGATIVE Final    Comment: (NOTE) The Xpert Xpress SARS-CoV-2/FLU/RSV plus assay is intended as an aid in the diagnosis of influenza from Nasopharyngeal swab specimens and should not be used as a sole basis for treatment. Nasal washings and aspirates are unacceptable for Xpert Xpress SARS-CoV-2/FLU/RSV testing.  Fact Sheet for Patients: BloggerCourse.com  Fact Sheet for Healthcare Providers: SeriousBroker.it  This test is not yet approved or cleared by the [place] FDA and has been authorized for detection and/or diagnosis of SARS-CoV-2 by FDA under an Emergency Use Authorization (EUA). This EUA will remain in effect (meaning this test can be used) for the duration of the COVID-19 declaration under Section 564(b)(1) of the Act, 21 U.S.C. section 360bbb-3(b)(1), unless the authorization is terminated or revoked.     Resp Syncytial Virus by PCR NEGATIVE NEGATIVE Final    Comment: (NOTE) Fact Sheet for Patients: BloggerCourse.com  Fact Sheet for Healthcare  Providers: SeriousBroker.it  This test is not yet approved or cleared by the [place] FDA and has been authorized for detection and/or diagnosis of SARS-CoV-2 by FDA under an Emergency Use Authorization (EUA). This EUA will remain in effect (meaning this test can be used) for the duration of the COVID-19 declaration under Section 564(b)(1) of the Act, 21 U.S.C. section 360bbb-3(b)(1), unless the authorization is terminated or revoked.  Performed at [facility], [address], [place], [place] [address]   Culture, blood (routine x 2)     Status: None   Collection Time: 04/12/23 11:37 PM   Specimen: BLOOD  Result Value Ref Range Status   Specimen Description BLOOD SITE NOT SPECIFIED  Final   Special Requests   Final    BOTTLES DRAWN AEROBIC AND ANAEROBIC Blood Culture adequate volume   Culture   Final    NO GROWTH 5 DAYS Performed at [facility], [address], [place], [place] [address]    Report Status 04/18/2023 FINAL  Final  Culture, blood (routine x 2)     Status: None   Collection Time: 04/12/23 11:58 PM   Specimen: BLOOD RIGHT ARM  Result Value Ref Range Status   Specimen Description BLOOD RIGHT ARM  Final   Special Requests   Final    BOTTLES DRAWN AEROBIC AND ANAEROBIC Blood Culture adequate volume   Culture   Final    NO GROWTH 5 DAYS Performed at [facility], [address], [place], [place] [address]    Report Status 04/18/2023 FINAL  Final  Urine Culture (for pregnant, neutropenic or urologic patients or patients with an indwelling urinary catheter)     Status: None   Collection Time: 04/13/23  8:26 AM   Specimen: Urine, Clean Catch  Result Value Ref Range Status   Specimen Description URINE, CLEAN CATCH  Final   Special Requests NONE  Final   Culture   Final    NO GROWTH Performed at [facility], [address], [place], [place] [address]    Report Status 04/14/2023 FINAL  Final     Labs: BNP  (last 3 results) Recent Labs    04/12/23 1850  BNP 45.9   Basic Metabolic Panel: Recent Labs  Lab 04/14/23  0945 04/15/23 0855 04/17/23 0616 04/18/23 0625  NA 144 146* 141 137  K 3.3* 3.3* 3.3* 3.7  CL 105 110 109 102  CO2 26 26 24 24   GLUCOSE 109* 116* 112* 87  BUN 29* 22 14 7*  CREATININE 1.15 0.96 0.70 0.71  CALCIUM 9.1 8.6* 8.3* 8.5*  MG 2.3  --  2.2 2.0  PHOS 2.3*  --   --  2.8   CBC: Recent Labs  Lab 04/14/23 0945 04/15/23 0855 04/17/23 0616 04/18/23 0625  WBC 18.4* 13.5* 9.5 8.6  HGB 13.4 13.0 12.9* 13.6  HCT 40.8 39.9 38.6* 40.3  MCV 87.2 87.7 87.1 86.5  PLT 332 378 403* 409*   CBG: Recent Labs  Lab 04/14/23 0610 04/14/23 0809 04/14/23 1229 04/15/23 0015 04/15/23 0647  GLUCAP 103* 105* 102* 94 102*   Urinalysis    Component Value Date/Time   COLORURINE YELLOW 04/13/2023 0650   APPEARANCEUR HAZY (A) 04/13/2023 0650   LABSPEC 1.026 04/13/2023 0650   PHURINE 5.0 04/13/2023 0650   GLUCOSEU NEGATIVE 04/13/2023 0650   HGBUR SMALL (A) 04/13/2023 0650   BILIRUBINUR NEGATIVE 04/13/2023 0650   KETONESUR 20 (A) 04/13/2023 0650   PROTEINUR 100 (A) 04/13/2023 0650   NITRITE NEGATIVE 04/13/2023 0650   LEUKOCYTESUR NEGATIVE 04/13/2023 0650   Sepsis Labs Recent Labs  Lab 04/14/23 0945 04/15/23 0855 04/17/23 0616 04/18/23 0625  WBC 18.4* 13.5* 9.5 8.6   Microbiology Recent Results (from the past 240 hours)  Resp panel by RT-PCR (RSV, Flu A&B, Covid) Anterior Nasal Swab     Status: Abnormal   Collection Time: 04/12/23  6:57 PM   Specimen: Anterior Nasal Swab  Result Value Ref Range Status   SARS Coronavirus 2 by RT PCR NEGATIVE NEGATIVE Final   Influenza A by PCR POSITIVE (A) NEGATIVE Final   Influenza B by PCR NEGATIVE NEGATIVE Final    Comment: (NOTE) The Xpert Xpress SARS-CoV-2/FLU/RSV plus assay is intended as an aid in the diagnosis of influenza from Nasopharyngeal swab specimens and should not be used as a sole basis for treatment. Nasal  washings and aspirates are unacceptable for Xpert Xpress SARS-CoV-2/FLU/RSV testing.  Fact Sheet for Patients: BloggerCourse.com  Fact Sheet for Healthcare Providers: SeriousBroker.it  This test is not yet approved or cleared by the [place] FDA and has been authorized for detection and/or diagnosis of SARS-CoV-2 by FDA under an Emergency Use Authorization (EUA). This EUA will remain in effect (meaning this test can be used) for the duration of the COVID-19 declaration under Section 564(b)(1) of the Act, 21 U.S.C. section 360bbb-3(b)(1), unless the authorization is terminated or revoked.     Resp Syncytial Virus by PCR NEGATIVE NEGATIVE Final    Comment: (NOTE) Fact Sheet for Patients: BloggerCourse.com  Fact Sheet for Healthcare Providers: SeriousBroker.it  This test is not yet approved or cleared by the [place] FDA and has been authorized for detection and/or diagnosis of SARS-CoV-2 by FDA under an Emergency Use Authorization (EUA). This EUA will remain in effect (meaning this test can be used) for the duration of the COVID-19 declaration under Section 564(b)(1) of the Act, 21 U.S.C. section 360bbb-3(b)(1), unless the authorization is terminated or revoked.  Performed at [facility], [address], [place], [place] [address]   Culture, blood (routine x 2)     Status: None   Collection Time: 04/12/23 11:37 PM   Specimen: BLOOD  Result Value Ref Range Status   Specimen Description BLOOD SITE NOT SPECIFIED  Final   Special Requests   Final    BOTTLES DRAWN AEROBIC AND ANAEROBIC Blood Culture adequate volume   Culture   Final    NO GROWTH 5 DAYS Performed at [facility], [address] [place], [place] [address]    Report Status 04/18/2023 FINAL  Final  Culture, blood (routine x 2)     Status: None   Collection Time: 04/12/23 11:58 PM    Specimen: BLOOD RIGHT ARM  Result Value Ref Range Status   Specimen Description BLOOD RIGHT ARM  Final   Special Requests   Final    BOTTLES DRAWN AEROBIC AND ANAEROBIC Blood Culture adequate volume   Culture   Final    NO GROWTH 5 DAYS Performed at [facility], [address], [place], [place] [address]    Report Status 04/18/2023 FINAL  Final  Urine Culture (for pregnant, neutropenic or urologic patients or patients with an indwelling urinary catheter)     Status: None   Collection Time: 04/13/23  8:26 AM   Specimen: Urine, Clean Catch  Result Value Ref Range Status   Specimen Description URINE, CLEAN CATCH  Final   Special Requests NONE  Final   Culture   Final    NO GROWTH Performed at [facility], [address] [place], [place] [address]    Report Status 04/14/2023 FINAL  Final    Procedures/Studies: DG Abd 1 View Result Date: 04/20/2023 CLINICAL DATA:  Ureteral calculus. EXAM: ABDOMEN - 1 VIEW COMPARISON:  April 17, 2023.  April 13, 2023. FINDINGS: The bowel gas pattern is normal. Large amount of contrast is noted in nondilated colon. No definite radio-opaque calculi are seen, but may be obscured by due to the large amount of contrast present. IMPRESSION: No abnormal bowel dilatation. Electronically Signed   By: Lupita Raider M.D.   On: 04/20/2023 11:57   DG Swallowing Func-Speech Pathology Result Date: 04/19/2023 Table formatting from the original result was not included. Modified Barium Swallow Study Patient Details Name: VASHAUN OSMON MRN: [id number] Date of Birth: 1934-08-28 Today's Date: 04/19/2023 HPI/PMH: HPI: VISHNU MOELLER is a 89 y.o. male with history of COPD, dementia, hypothyroidism who was recently evaluated by cardiologist for chest pain and had stress test on 04/02/2013 which was unremarkable with normal LV was brought to the ER after patient was complaining of shortness of breath and productive cough.  In the ER patient was hypoxic requiring 2 L  oxygen with chest x-ray showing concerning features for infiltrates  Patient was started on empiric antibiotics for pneumonia. Found to have SBO resolved per MD noted 1/28. MD note says pt "ddid cough some with drinking water." Clinical Impression: Clinical Impression: Oral dysphagia marked by decreased oral cohesion with posterior spill to pharynx, delayed transit, minimal lingual residue and slow and prolonged mastication with solid. There was premature spill of thin liquid that fell into pt's trachea prior to swallow initiating which was sensed with immediate cough not clearing the airway (PAS 7). Chin tuck posture reduced to material penetrating above vocal cords (PAS 3). In addition he exhibited reduced tongue base retraction, anterior hyoid excursion leading to vallecular and pyriform sinus residue. There was partial pharyngoesophageal segment distention with partial obstruction of flow. Cues to swallow a second time did reduce residue to mild. Nectar thick liquid was penetrated but ejected during the swallow (PAS 2) and one instance nectar residue from pharynx spilled into vestibule above vocal cords with spontaneous cough  to clear. Recommend Dys 2 (minced/fine chopped), nectar thick liquids, swallow twice, clear throat during meals and crush meds. Pt will need full supervision/assist to follow strategies due to history of dementia. ST will continue to follow. Factors that may increase risk of adverse event in presence of aspiration Rubye Oaks & Clearance Coots 2021): Factors that may increase risk of adverse event in presence of aspiration Rubye Oaks & Clearance Coots 2021): Reduced cognitive function Recommendations/Plan: Swallowing Evaluation Recommendations Swallowing Evaluation Recommendations Recommendations: PO diet PO Diet Recommendation: Dysphagia 2 (Finely chopped); Mildly thick liquids (Level 2, nectar thick) Liquid Administration via: Cup; Straw Medication Administration: Crushed with puree Supervision: Patient able to  self-feed; Full supervision/cueing for swallowing strategies Swallowing strategies  : Small bites/sips; Slow rate; Multiple dry swallows after each bite/sip; Clear throat intermittently Postural changes: Position pt fully upright for meals Oral care recommendations: Oral care BID (2x/day) Treatment Plan Treatment Plan Treatment recommendations: Therapy as outlined in treatment plan below Follow-up recommendations: Skilled nursing-short term rehab (<3 hours/day) Functional status assessment: Patient has had a recent decline in their functional status and demonstrates the ability to make significant improvements in function in a reasonable and predictable amount of time. Treatment frequency: Min 2x/week Treatment duration: 2 weeks Interventions: Patient/family education; Compensatory techniques; Diet toleration management by SLP Recommendations Recommendations for follow up therapy are one component of a multi-disciplinary discharge planning process, led by the attending physician.  Recommendations may be updated based on patient status, additional functional criteria and insurance authorization. Assessment: Orofacial Exam: Orofacial Exam Oral Cavity: Oral Hygiene: WFL Oral Cavity - Dentition: Dentures, top; Missing dentition (missiing some lower posterior, poor condition) Orofacial Anatomy: WFL Oral Motor/Sensory Function: WFL Anatomy: Anatomy: WFL Boluses Administered: Boluses Administered Boluses Administered: Thin liquids (Level 0); Mildly thick liquids (Level 2, nectar thick); Moderately thick liquids (Level 3, honey thick); Puree; Solid  Oral Impairment Domain: Oral Impairment Domain Lip Closure: No labial escape Tongue control during bolus hold: Posterior escape of less than half of bolus Bolus preparation/mastication: Slow prolonged chewing/mashing with complete recollection Bolus transport/lingual motion: Delayed initiation of tongue motion (oral holding) Oral residue: Trace residue lining oral structures  Location of oral residue : Tongue Initiation of pharyngeal swallow : Pyriform sinuses  Pharyngeal Impairment Domain: Pharyngeal Impairment Domain Soft palate elevation: No bolus between soft palate (SP)/pharyngeal wall (PW) Laryngeal elevation: Partial superior movement of thyroid cartilage/partial approximation of arytenoids to epiglottic petiole Anterior hyoid excursion: Partial anterior movement Epiglottic movement: Complete inversion Laryngeal vestibule closure: Incomplete, narrow column air/contrast in laryngeal vestibule Pharyngeal stripping wave : Present - diminished Pharyngeal contraction (A/P view only): N/A Pharyngoesophageal segment opening: Partial distention/partial duration, partial obstruction of flow Tongue base retraction: Narrow column of contrast or air between tongue base and PPW Pharyngeal residue: Collection of residue within or on pharyngeal structures Location of pharyngeal residue: Valleculae; Pyriform sinuses  Esophageal Impairment Domain: Esophageal Impairment Domain Esophageal clearance upright position: Esophageal retention (mild) Pill: Pill Consistency administered: Puree Puree: Impaired (see clinical impressions) Penetration/Aspiration Scale Score: Penetration/Aspiration Scale Score 1.  Material does not enter airway: Pill; Solid; Moderately thick liquids (Level 3, honey thick) 2.  Material enters airway, remains ABOVE vocal cords then ejected out: Mildly thick liquids (Level 2, nectar thick) 3.  Material enters airway, remains ABOVE vocal cords and not ejected out: Mildly thick liquids (Level 2, nectar thick) (from residue) 7.  Material enters airway, passes BELOW cords and not ejected out despite cough attempt by patient: Thin liquids (Level 0) Compensatory Strategies: Compensatory Strategies Compensatory strategies: Yes Straw:  Effective Effective Straw: Mildly thick liquid (Level 2, nectar thick) Multiple swallows: Effective Effective Multiple Swallows: Puree Chin tuck: Ineffective  Ineffective Chin Tuck: Thin liquid (Level 0)   General Information: Caregiver present: No  Diet Prior to this Study: Other (Comment); Thin liquids (Level 0) (soft)   Temperature : Normal   Respiratory Status: WFL   Supplemental O2: None (Room air)   History of Recent Intubation: No  Behavior/Cognition: Alert; Cooperative; Pleasant mood; Requires cueing Self-Feeding Abilities: Able to self-feed Baseline vocal quality/speech: Normal No data recorded Volitional Swallow: Able to elicit Exam Limitations: No limitations Goal Planning: Prognosis for improved oropharyngeal function: Fair (fiar) Barriers to Reach Goals: Cognitive deficits No data recorded No data recorded Consulted and agree with results and recommendations: Patient; Other (comment) (pt has dementia) Pain: Pain Assessment Pain Assessment: No/denies pain Faces Pain Scale: 0 Breathing: 0 Negative Vocalization: 0 Facial Expression: 0 Body Language: 0 Consolability: 0 PAINAD Score: 0 End of Session: Start Time:SLP Start Time (ACUTE ONLY): 0115 Stop Time: SLP Stop Time (ACUTE ONLY): 0134 Time Calculation:SLP Time Calculation (min) (ACUTE ONLY): 19 min Charges: SLP Evaluations $ SLP Speech Visit: 1 Visit SLP Evaluations $BSS Swallow: 1 Procedure $MBS Swallow: 1 Procedure SLP visit diagnosis: SLP Visit Diagnosis: Dysphagia, pharyngoesophageal phase (R13.14); Dysphagia, oral phase (R13.11) Past Medical History: Past Medical History: Diagnosis Date  COPD (chronic obstructive pulmonary disease) (HCC)   Emphysema   GERD (gastroesophageal reflux disease)   Seasonal allergies  Past Surgical History: Past Surgical History: Procedure Laterality Date  CARDIAC CATHETERIZATION    PROSTATE SURGERY   Royce Macadamia 04/19/2023, 3:01 PM  DG CHEST PORT 1 VIEW Result Date: 04/19/2023 CLINICAL DATA:  Shortness of breath. EXAM: PORTABLE CHEST 1 VIEW COMPARISON:  CT chest dated April 13, 2023. Chest radiograph dated April 12, 2023. FINDINGS: The heart size and mediastinal  contours are within normal limits. Bibasilar, right-greater-than-left, airspace opacities, increased on the left compared to the prior radiograph dated 04/12/2023. No pleural effusion or pneumothorax. No acute osseous abnormality. IMPRESSION: Bibasilar right-greater-than-left airspace opacities, increased at the left lung base. Electronically Signed   By: Hart Robinsons M.D.   On: 04/19/2023 08:18   DG Abd Portable 1V-Small Bowel Obstruction Protocol-24 hr delay Result Date: 04/17/2023 CLINICAL DATA:  [id number] SBO (small bowel obstruction) (HCC) [id number] EXAM: PORTABLE ABDOMEN - 1 VIEW COMPARISON:  the previous day's study FINDINGS: Stomach and small bowel nondilated. Residual contrast material in the nondilated colon. No abnormal abdominal calcifications. Visualized lung bases clear. Mild lumbar dextroscoliosis with multilevel spondylitic change. IMPRESSION: Nonobstructive bowel gas pattern. Electronically Signed   By: Corlis Leak M.D.   On: 04/17/2023 16:02   DG Abd Portable 1V-Small Bowel Obstruction Protocol-initial, 8 hr delay Result Date: 04/16/2023 CLINICAL DATA:  8 hour small-bowel follow-through EXAM: PORTABLE ABDOMEN - 1 VIEW COMPARISON:  Film from earlier in the same day. FINDINGS: Administered contrast material now lies entirely within the colon. Some residual small bowel dilatation is noted consistent with a partial small bowel obstruction. No free air is noted. No other focal abnormality is seen. IMPRESSION: Contrast now lies entirely within the colon consistent with a partial small bowel obstruction. Persistent but improved small bowel dilatation is noted. Electronically Signed   By: Alcide Clever M.D.   On: 04/16/2023 22:06   DG Abd Portable 1V Result Date: 04/16/2023 CLINICAL DATA:  Ileus EXAM: PORTABLE ABDOMEN - 1 VIEW COMPARISON:  X-ray 04/15/2023 and older FINDINGS: Persistent distension of air-filled loops of small bowel. The loop previously  in left upper quadrant measuring 4.5 cm in  diameter today measures 4.2 cm. Other loops are slightly less dilated with some residual. There is air in nondilated colon diffusely with scattered stool. Air and stool towards the rectum. No obstruction. No definite free air. Curvature and degenerative changes along the spine. Overlapping cardiac leads. IMPRESSION: Slight improvement in dilated air-filled loops of small bowel with significant residual. Recommend continued surveillance. Electronically Signed   By: Karen Kays M.D.   On: 04/16/2023 10:06   DG Abd Portable 1V Result Date: 04/15/2023 CLINICAL DATA:  Small bowel obstruction. EXAM: PORTABLE ABDOMEN - 1 VIEW COMPARISON:  04/14/2023 FINDINGS: General decrease in diffuse gaseous distension although dilated gas-filled small bowel the left upper quadrant still measures up to 4.5 cm diameter. There is some gas visible in the nondilated left colon on the current study. Convex rightward lumbar scoliosis evident. IMPRESSION: General decrease in diffuse gaseous bowel distension although dilated gas-filled small bowel in the left upper quadrant still measures up to 4.5 cm diameter. Electronically Signed   By: Kennith Center M.D.   On: 04/15/2023 11:21   DG Abd Portable 1V Result Date: 04/14/2023 CLINICAL DATA:  Small-bowel obstruction. EXAM: PORTABLE ABDOMEN - 1 VIEW COMPARISON:  04/13/2023 FINDINGS: Moderate dilatation of small bowel shows no significant change since previous study. Relative paucity of colonic gas also noted in suspicious for small bowel obstruction. IMPRESSION: No significant change in small bowel dilatation, suspicious for small bowel obstruction. Electronically Signed   By: Danae Orleans M.D.   On: 04/14/2023 09:17   DG Abd 1 View Result Date: 04/13/2023 CLINICAL DATA:  NG tube EXAM: ABDOMEN - 1 VIEW COMPARISON:  Chest x-ray 04/13/2023 FINDINGS: Enteric tube tip is at the level of the gastroesophageal junction. Dilated small bowel loops are again seen. There are atelectatic changes in  the right lung base. IMPRESSION: Enteric tube tip is at the level of the gastroesophageal junction. Recommend advancement 10 cm. Electronically Signed   By: Darliss Cheney M.D.   On: 04/13/2023 20:13   DG Abd Portable 1 View Result Date: 04/13/2023 CLINICAL DATA:  NG tube placement. EXAM: PORTABLE ABDOMEN - 1 VIEW COMPARISON:  Radiograph earlier today FINDINGS: Tip and side port of the enteric tube below the diaphragm in the stomach. Gaseous bowel distention in the upper abdomen again seen. Patchy right lung base opacity. IMPRESSION: Tip and side port of the enteric tube below the diaphragm in the stomach. Electronically Signed   By: Narda Rutherford M.D.   On: 04/13/2023 16:47   DG Abd Portable 1 View Result Date: 04/13/2023 CLINICAL DATA:  NG tube placement EXAM: PORTABLE ABDOMEN - 1 VIEW COMPARISON:  04/13/2023 FINDINGS: Enteric tube tip is retracted, side-port at the level of the distal esophagus, tip in the region of GE junction. Dilated small bowel in the upper abdomen. Atelectasis or infiltrates at the bases. IMPRESSION: Enteric tube tip is retracted, side-port at the level of the distal esophagus, tip in the region of GE junction. Recommend advancement by around 10 cm for more optimal positioning Electronically Signed   By: Jasmine Pang M.D.   On: 04/13/2023 15:16   DG Abd Portable 1 View Result Date: 04/13/2023 CLINICAL DATA:  NG tube placement. EXAM: PORTABLE ABDOMEN - 1 VIEW COMPARISON:  04/09/2019 FINDINGS: NG tube tip is in the gastric fundus with proximal side port below the GE junction. Diffuse gaseous small bowel distension is seen in the visualized upper abdomen with small bowel loops measuring up to 3.5  cm. Chronic interstitial changes noted in the visualized lung bases. IMPRESSION: 1. NG tube tip is in the gastric fundus. 2. Diffuse gaseous small bowel distension. Electronically Signed   By: Kennith Center M.D.   On: 04/13/2023 07:06   CT CHEST ABDOMEN PELVIS WO CONTRAST Result Date:  04/13/2023 CLINICAL DATA:  Shortness of breath, coughing and congestion, suspected small-bowel obstruction with abdominal pain. EXAM: CT CHEST, ABDOMEN AND PELVIS WITHOUT CONTRAST TECHNIQUE: Multidetector CT imaging of the chest, abdomen and pelvis was performed following the standard protocol without IV contrast. RADIATION DOSE REDUCTION: This exam was performed according to the departmental dose-optimization program which includes automated exposure control, adjustment of the mA and/or kV according to patient size and/or use of iterative reconstruction technique. COMPARISON:  Portable chest yesterday, AP and lateral chest 03/16/2023, flat plate abdomen film 04/09/2019, and CT abdomen and pelvis without contrast 02/04/2018. FINDINGS: CT CHEST FINDINGS Cardiovascular: Cardiac size is normal. Left-sided coronary arteries are heavily calcified. Pulmonary arteries and veins are normal in caliber. Small pericardial effusion noted. There are calcifications in the aortic valve leaflets. Scattered aortic calcific plaques without aneurysm. There is minimal calcific plaque in the great vessels. Mediastinum/Nodes: The esophageal wall is diffusely thickened including over the GE junction. Small hiatal hernia. Endoscopy recommended. Thyroid gland is atrophic without mass.  Axillary spaces are clear. No enlarged intrathoracic lymph nodes are identified without contrast. A thoracic trachea in both main bronchi are unremarkable. Lungs/Pleura: Diffuse bronchial thickening noted greater in the lower lobes. Mild biapical pleural-parenchymal scarring calcifications. There is consolidation with air bronchograms in the right lower lobe, primarily in the basal segments and could be due to atelectasis, pneumonia or aspiration. This was not seen on the AP and lateral chest from 03/16/2023. There is additional tree-in-bud interstitial micronodularity in the superior segment of the right lower lobe in the superior segment of the left lower  lobe, consistent with infectious or inflammatory bronchiolitis. There is additional band consolidation with associated scattered subsegmental bronchial plugging in the medial basal left lower lobe. This could also be seen with aspiration. The lungs are otherwise generally clear. No pleural effusion is seen. Musculoskeletal: Osteopenia with mild degenerative changes of thoracic spine. No acute or significant osseous findings. No chest wall mass. CT ABDOMEN PELVIS FINDINGS Hepatobiliary: Loss of fine detail due to streak artifacts from the patient's arms in the field. No obvious liver mass. No obvious gallbladder wall thickening or calcified stones. No biliary dilatation. Pancreas: Partially atrophic.  No focal abnormality. Spleen: No abnormality.  No splenomegaly. Adrenals/Urinary Tract: There is no adrenal mass. No contour deforming abnormality of the unenhanced kidneys. There are occasional punctate nonobstructive caliceal stones in both renal collecting systems. On the right, there is a 5 mm in length by 2 mm in diameter rod-like UPJ stone, with mild hydronephrosis upstream. Both ureters are otherwise clear. The bladder is unremarkable. There is impression on the bladder by the enlarged prostate. Stomach/Bowel: The stomach distended with air and fluid. There is diffuse small bowel dilatation except for small caliber segments in the right lower quadrant, maximum small bowel caliber 3.7 cm. A transitional segment was not found but the difference in caliber is more suggestive of obstruction than ileus. Etiology is undetermined. An appendix is not seen. There is diffuse colonic diverticulosis heaviest in the sigmoid, without diverticulitis or wall thickening. Vascular/Lymphatic: Aortic atherosclerosis. No enlarged abdominal or pelvic lymph nodes. Reproductive: Moderate to severe prostatomegaly, transverse axis 6 cm. Other: No abdominal wall hernia. Small left inguinal fat hernia.  Small volume of perihepatic and pelvic  ascites. No pneumatosis, free air or free hemorrhage. No localizing collections. Musculoskeletal: Degenerative change lumbar spine, mild dextrorotary scoliosis. Most advanced degenerative change at L2-3 and L3-4. No acute or other significant osseous findings. IMPRESSION: 1. Diffuse small bowel dilatation except for small caliber segments in the right lower quadrant, maximum small bowel caliber 3.7 cm. A transitional segment was not found but the difference in caliber is more suggestive of a distal small bowel obstruction than ileus. Etiology is undetermined. 2. Small volume of perihepatic and pelvic ascites. No pneumatosis, free air or free hemorrhage. 3. Bilateral lower lobe consolidation with air bronchograms, right greater than left, which could be due to atelectasis, pneumonia or aspiration. 4. Additional tree-in-bud interstitial micronodularity in the superior segments of both lower lobes, consistent with infectious or inflammatory bronchiolitis. 5. Diffuse esophageal wall thickening including over the GE junction. Endoscopy recommended. Small hiatal hernia. 6. Aortic and coronary artery atherosclerosis. 7. 5 mm in length by 2 mm in diameter rod-like right UPJ stone with mild hydronephrosis. 8. Nonobstructive bilateral micronephrolithiasis. 9. Prostatomegaly, transverse axis 6 cm. 10. Colonic diverticulosis without evidence of diverticulitis. 11. Osteopenia and degenerative change. Aortic Atherosclerosis (ICD10-I70.0). Electronically Signed   By: Almira Bar M.D.   On: 04/13/2023 05:21   DG Chest Port 1 View Result Date: 04/12/2023 CLINICAL DATA:  Cough, shortness of breath EXAM: PORTABLE CHEST 1 VIEW COMPARISON:  03/16/2023 FINDINGS: Heart and mediastinal contours within normal limits. Airspace opacity in the right lower lobe. Left lung clear. No effusions or acute bony abnormality. IMPRESSION: Right lower lobe airspace opacity concerning for pneumonia. Electronically Signed   By: Charlett Nose M.D.    On: 04/12/2023 19:05   MYOCARDIAL PERFUSION IMAGING Result Date: 04/03/2023   The study is normal. The study is low risk.   No ST deviation was noted.   LV perfusion is normal. There is no evidence of ischemia. There is no evidence of infarction.   Left ventricular function is normal. Nuclear stress EF: 66%. The left ventricular ejection fraction is hyperdynamic (>65%). End diastolic cavity size is normal. End systolic cavity size is normal.   Prior study not available for comparison.   ECHOCARDIOGRAM COMPLETE Result Date: 04/03/2023    ECHOCARDIOGRAM REPORT   Patient Name:   ASAF ELMQUIST Date of Exam: 04/03/2023 Medical Rec #:  [id number]     Height:       66.0 in Accession #:    [id number]    Weight:       132.6 lb Date of Birth:  1934-11-24    BSA:          1.679 m Patient Age:    88 years      BP:           141/74 mmHg Patient Gender: M             HR:           51 bpm. Exam Location:  [address] Procedure: 2D Echo, 3D Echo, Cardiac Doppler, Color Doppler and Strain Analysis Indications:    R07.9 Chest Pain  History:        Patient has no prior history of Echocardiogram examinations.                 COPD; Signs/Symptoms:Shortness of Breath.  Sonographer:    Clearence Ped RCS Referring Phys: [id number] SUNIT TOLIA IMPRESSIONS  1. Left ventricular ejection fraction, by estimation, is 60 to 65%. Left ventricular ejection  fraction by 3D volume is 64 %. The left ventricle has normal function. The left ventricle has no regional wall motion abnormalities. Left ventricular diastolic  parameters are indeterminate. The average left ventricular global longitudinal strain is -16.4 %. The global longitudinal strain is abnormal.  2. Right ventricular systolic function is normal. The right ventricular size is normal. There is normal pulmonary artery systolic pressure. The estimated right ventricular systolic pressure is 21.7 mmHg.  3. Left atrial size was mildly dilated.  4. The mitral valve is normal in structure. No  evidence of mitral valve regurgitation. No evidence of mitral stenosis.  5. Restrictive motion of calcified right coronary cusp. . The aortic valve is normal in structure. There is moderate thickening of the aortic valve. Aortic valve regurgitation is mild. Aortic valve sclerosis is present, with no evidence of aortic valve stenosis.  6. The inferior vena cava is normal in size with greater than 50% respiratory variability, suggesting right atrial pressure of 3 mmHg. FINDINGS  Left Ventricle: Left ventricular ejection fraction, by estimation, is 60 to 65%. Left ventricular ejection fraction by 3D volume is 64 %. The left ventricle has normal function. The left ventricle has no regional wall motion abnormalities. The average left ventricular global longitudinal strain is -16.4 %. The global longitudinal strain is abnormal. The left ventricular internal cavity size was normal in size. There is no left ventricular hypertrophy. Left ventricular diastolic parameters are indeterminate. Right Ventricle: The right ventricular size is normal. No increase in right ventricular wall thickness. Right ventricular systolic function is normal. There is normal pulmonary artery systolic pressure. The tricuspid regurgitant velocity is 2.16 m/s, and  with an assumed right atrial pressure of 3 mmHg, the estimated right ventricular systolic pressure is 21.7 mmHg. Left Atrium: Left atrial size was mildly dilated. Right Atrium: Right atrial size was normal in size. Pericardium: There is no evidence of pericardial effusion. Mitral Valve: The mitral valve is normal in structure. No evidence of mitral valve regurgitation. No evidence of mitral valve stenosis. Tricuspid Valve: The tricuspid valve is normal in structure. Tricuspid valve regurgitation is mild . No evidence of tricuspid stenosis. Aortic Valve: Restrictive motion of calcified right coronary cusp. The aortic valve is normal in structure. There is moderate thickening of the aortic  valve. Aortic valve regurgitation is mild. Aortic regurgitation PHT measures 1012 msec. Aortic valve sclerosis is present, with no evidence of aortic valve stenosis. Pulmonic Valve: The pulmonic valve was normal in structure. Pulmonic valve regurgitation is not visualized. No evidence of pulmonic stenosis. Aorta: The aortic root and ascending aorta are structurally normal, with no evidence of dilitation. Venous: The inferior vena cava is normal in size with greater than 50% respiratory variability, suggesting right atrial pressure of 3 mmHg. IAS/Shunts: No atrial level shunt detected by color flow Doppler.  LEFT VENTRICLE PLAX 2D LVIDd:         3.70 cm         Diastology LVIDs:         2.70 cm         LV e' medial:    6.31 cm/s LV PW:         1.00 cm         LV E/e' medial:  10.0 LV IVS:        1.10 cm         LV e' lateral:   5.22 cm/s LVOT diam:     1.70 cm  LV E/e' lateral: 12.0 LV SV:         52 LV SV Index:   31              2D LVOT Area:     2.27 cm        Longitudinal                                Strain                                2D Strain GLS  -16.4 %                                Avg:                                 3D Volume EF                                LV 3D EF:    Left                                             ventricul                                             ar                                             ejection                                             fraction                                             by 3D                                             volume is                                             64 %.                                 3D Volume EF:  3D EF:        64 %                                LV EDV:       79 ml                                LV ESV:       28 ml                                LV SV:        51 ml RIGHT VENTRICLE RV Basal diam:  3.80 cm RV S prime:     12.90 cm/s TAPSE (M-mode): 2.8 cm RVSP:           21.7 mmHg  LEFT ATRIUM             Index        RIGHT ATRIUM           Index LA diam:        2.80 cm 1.67 cm/m   RA Pressure: 3.00 mmHg LA Vol (A2C):   56.2 ml 33.44 ml/m  RA Area:     14.10 cm LA Vol (A4C):   33.8 ml 20.16 ml/m  RA Volume:   37.80 ml  22.51 ml/m LA Biplane Vol: 50.8 ml 30.25 ml/m  AORTIC VALVE LVOT Vmax:   90.10 cm/s LVOT Vmean:  59.200 cm/s LVOT VTI:    0.230 m AI PHT:      1012 msec  AORTA Ao Root diam: 3.10 cm Ao Asc diam:  3.30 cm MITRAL VALVE               TRICUSPID VALVE MV Area (PHT):             TR Peak grad:   18.7 mmHg MV Decel Time:             TR Vmax:        216.00 cm/s MV E velocity: 62.90 cm/s  Estimated RAP:  3.00 mmHg MV A velocity: 80.20 cm/s  RVSP:           21.7 mmHg MV E/A ratio:  0.78                            SHUNTS                            Systemic VTI:  0.23 m                            Systemic Diam: 1.70 cm Clearnce Hasten Electronically signed by Clearnce Hasten Signature Date/Time: 04/03/2023/9:07:26 AM    Final     Time coordinating discharge: 45 mins  SIGNED:  Carollee Herter, DO [facility] 04/20/23, 1:04 PM

## 2023-04-20 NOTE — Plan of Care (Signed)
?  Problem: Education: Goal: Knowledge of General Education information will improve Description: Including pain rating scale, medication(s)/side effects and non-pharmacologic comfort measures Outcome: Progressing   Problem: Health Behavior/Discharge Planning: Goal: Ability to manage health-related needs will improve Outcome: Progressing   Problem: Clinical Measurements: Goal: Ability to maintain clinical measurements within normal limits will improve Outcome: Progressing Goal: Will remain free from infection Outcome: Progressing Goal: Diagnostic test results will improve Outcome: Progressing Goal: Respiratory complications will improve Outcome: Progressing Goal: Cardiovascular complication will be avoided Outcome: Progressing   Problem: Clinical Measurements: Goal: Will remain free from infection Outcome: Progressing   Problem: Clinical Measurements: Goal: Diagnostic test results will improve Outcome: Progressing   

## 2023-04-20 NOTE — Progress Notes (Signed)
PROGRESS NOTE    Anthony Ball  ZOX:[id number] DOB: 1934/08/07 DOA: 04/12/2023 PCP: Georgann Housekeeper, MD  Subjective: Pt seen and examined.  Stable. On RA. Potential SNF bed today.   Hospital Course: HPI: Anthony Ball is a 89 y.o. male with history of COPD, dementia, hypothyroidism who was recently evaluated by cardiologist for chest pain and had stress test on 04/02/2013 which was unremarkable with normal LV was brought to the ER after patient was complaining of shortness of breath and productive cough.  Patient has dementia and is not able to give good history.  I was able to reach one of the sisters of the patient but they have requested to contact another sister by the name [name] who is not at this time reachable.   ED Course: In the ER patient was hypoxic requiring 2 L oxygen with chest x-ray showing concerning features for infiltrates WBC count was 19,000.  Patient was started on empiric antibiotics for pneumonia.  At the time of my exam patient started having multiple episodes of vomiting and abdomen appeared distended.  I ordered a stat CT abdomen pelvis CT chest.  CT abdomen pelvis shows features concerning for bowel obstruction and CT chest shows pneumonia.  Also shows features of right ureteric stone.  Significant Events: Admitted 04/12/2023 for SBO, pneumonia, influenza A 04-13-2023 pt seen by general surgery. NG tube inserted 04-14-2023 pt pulled out his NG tube 04-17-2023 resolution of SBO vs ileus  Significant Labs: Positive for Influenza A WBC 19.4, Hgb 16, Plt 322 Na 136, K 3.8, BUN 30, Scr 1.12, glu 125  Significant Imaging Studies: CXR shows Right lower lobe airspace opacity concerning for pneumonia.  CT chest/abd/pelvis shows Diffuse small bowel dilatation except for small caliber segments in the right lower quadrant, maximum small bowel caliber 3.7 cm. A transitional segment was not found but the difference in caliber is more suggestive of a distal small bowel  obstruction than ileus. Etiology is undetermined. 2. Small volume of perihepatic and pelvic ascites. No pneumatosis, free air or free hemorrhage. 3. Bilateral lower lobe consolidation with air bronchograms, right greater than left, which could be due to atelectasis,  pneumonia or aspiration. 4. Additional tree-in-bud interstitial micronodularity in the superior segments of both lower lobes, consistent with infectious or inflammatory bronchiolitis. 5. Diffuse esophageal wall thickening including over the GE junction. Endoscopy recommended. Small hiatal hernia.  6. Aortic and coronary artery atherosclerosis. 7. 5 mm in length by 2 mm in diameter rod-like right UPJ stone with mild hydronephrosis. 8. Nonobstructive bilateral micronephrolithiasis. 9. Prostatomegaly, transverse axis 6 cm. 10. Colonic diverticulosis without evidence of diverticulitis. 11. Osteopenia and degenerative change.  Antibiotic Therapy: Anti-infectives (From admission, onward)    Start     Dose/Rate Route Frequency Ordered Stop   04/14/23 0000  azithromycin (ZITHROMAX) 500 mg in sodium chloride 0.9 % 250 mL IVPB        500 mg 250 mL/hr over 60 Minutes Intravenous Every 24 hours 04/13/23 0541 04/18/23 0110   04/13/23 2200  cefTRIAXone (ROCEPHIN) 2 g in sodium chloride 0.9 % 100 mL IVPB        2 g 200 mL/hr over 30 Minutes Intravenous Every 24 hours 04/13/23 0541 04/17/23 2317   04/12/23 2345  cefTRIAXone (ROCEPHIN) 1 g in sodium chloride 0.9 % 100 mL IVPB        1 g 200 mL/hr over 30 Minutes Intravenous  Once 04/12/23 2337 04/13/23 0034   04/12/23 2345  azithromycin (ZITHROMAX) 500 mg  in sodium chloride 0.9 % 250 mL IVPB        500 mg 250 mL/hr over 60 Minutes Intravenous  Once 04/12/23 2337 04/13/23 0102       Procedures:   Consultants: Urology General surgery    Assessment and Plan: * CAP (community acquired pneumonia) 04-18-2023 pt has completed 5 days of rocephin/zithromax. Last dose on 04-18-2023. 04-19-2023  resolved.  Oral phase dysphagia 04-19-2023 seen by ST. Had MBS. Placed on dysphagia-2(chopped diet) with nectar thick liquids.  Swallow Evaluation Recommendations Recommendations: PO diet PO Diet Recommendation: Dysphagia 2 (Finely chopped);Mildly thick liquids (Level 2, nectar thick) Liquid Administration via: Cup;Straw Medication Administration: Crushed with puree Supervision: Patient able to self-feed;Full supervision/cueing for swallowing strategies Swallowing strategies  : Small bites/sips;Slow rate;Multiple dry swallows after each bite/sip;Clear throat intermittently Postural changes: Position pt fully upright for meals Oral care recommendations: Oral care BID (2x/day)  Influenza A with pneumonia 04-18-2023 continue supportive care. Pt completed 5 days of abx.  04-19-2023 resolved.  Acute respiratory failure with hypoxia (HCC) 04-18-2023 pt needed supplemental O2 with walking. May need home O2.  04-19-2023 on RA at rest. did not require supplemental O2 with ambulation today. Walked 200 feet.  Ureteral stone 04-18-2023 urology not going to intervene. Continue flomax.  04-19-2023 stable.  SBO (small bowel obstruction) (HCC) 04-18-2023 KUB from yesterday shows contrast in colon. Pt did some coughing with swallowing water. Will consult ST to assess for dysphagia. General surgery has signed off. 04-19-2023 resolved.  GERD (gastroesophageal reflux disease) 04-18-2023 continue protonix 40 mg daily. 04-19-2023 stable.  Dementia (HCC) 04-18-2023 chronic. 04-19-2023 stable.  04-20-2023 chronic and stable.  Hypothyroidism 04-18-2023 stable on synthroid 25 mcg daily. 04-19-2023 stable. 04-20-2023 stable on 25 mcg synthroid daily.  COPD (chronic obstructive pulmonary disease) (HCC) 04-18-2023 start qid nebs due to hypoxia. 04-19-2023 stable. Continue with nebs  04-20-2023 continue with nebs qid. Stable.  DVT prophylaxis: SCDs Start: 04/13/23 0540    Code Status: Full  Code Family Communication: no family at bedside Disposition Plan: SNF Reason for continuing need for hospitalization: medically stable for DC.  Objective: Vitals:   04/19/23 1623 04/19/23 1927 04/20/23 0504 04/20/23 0849  BP: 106/62 122/67 127/75 114/64  Pulse: 82 77 72 67  Resp: 18 18 18    Temp: (!) 97.5 F (36.4 C) 97.6 F (36.4 C) 98 F (36.7 C) (!) 97.5 F (36.4 C)  TempSrc: Oral Oral Oral Oral  SpO2: 93% 93% 91% 91%  Weight:      Height:        Intake/Output Summary (Last 24 hours) at 04/20/2023 1258 Last data filed at 04/19/2023 1415 Gross per 24 hour  Intake 118 ml  Output --  Net 118 ml   Filed Weights   04/12/23 1757 04/13/23 1725  Weight: 61 kg 56.2 kg    Examination:  Physical Exam Vitals and nursing note reviewed.  HENT:     Head: Normocephalic and atraumatic.     Nose: Nose normal.  Eyes:     General: No scleral icterus. Cardiovascular:     Rate and Rhythm: Normal rate and regular rhythm.  Pulmonary:     Effort: Pulmonary effort is normal.     Breath sounds: Normal breath sounds.  Abdominal:     General: Bowel sounds are normal. There is no distension.     Palpations: Abdomen is soft.  Musculoskeletal:     Right lower leg: No edema.  Skin:    General: Skin is warm and dry.  Capillary Refill: Capillary refill takes less than 2 seconds.  Neurological:     Mental Status: He is alert. He is disoriented.     Comments: Pleasantly demented     Data Reviewed: I have personally reviewed following labs and imaging studies  CBC: Recent Labs  Lab 04/14/23 0945 04/15/23 0855 04/17/23 0616 04/18/23 0625  WBC 18.4* 13.5* 9.5 8.6  HGB 13.4 13.0 12.9* 13.6  HCT 40.8 39.9 38.6* 40.3  MCV 87.2 87.7 87.1 86.5  PLT 332 378 403* 409*   Basic Metabolic Panel: Recent Labs  Lab 04/14/23 0945 04/15/23 0855 04/17/23 0616 04/18/23 0625  NA 144 146* 141 137  K 3.3* 3.3* 3.3* 3.7  CL 105 110 109 102  CO2 26 26 24 24   GLUCOSE 109* 116* 112* 87   BUN 29* 22 14 7*  CREATININE 1.15 0.96 0.70 0.71  CALCIUM 9.1 8.6* 8.3* 8.5*  MG 2.3  --  2.2 2.0  PHOS 2.3*  --   --  2.8   GFR: Estimated Creatinine Clearance: 52.7 mL/min (by C-G formula based on SCr of 0.71 mg/dL).  BNP (last 3 results) Recent Labs    04/12/23 1850  BNP 45.9   CBG: Recent Labs  Lab 04/14/23 0610 04/14/23 0809 04/14/23 1229 04/15/23 0015 04/15/23 0647  GLUCAP 103* 105* 102* 94 102*    Recent Results (from the past 240 hours)  Resp panel by RT-PCR (RSV, Flu A&B, Covid) Anterior Nasal Swab     Status: Abnormal   Collection Time: 04/12/23  6:57 PM   Specimen: Anterior Nasal Swab  Result Value Ref Range Status   SARS Coronavirus 2 by RT PCR NEGATIVE NEGATIVE Final   Influenza A by PCR POSITIVE (A) NEGATIVE Final   Influenza B by PCR NEGATIVE NEGATIVE Final    Comment: (NOTE) The Xpert Xpress SARS-CoV-2/FLU/RSV plus assay is intended as an aid in the diagnosis of influenza from Nasopharyngeal swab specimens and should not be used as a sole basis for treatment. Nasal washings and aspirates are unacceptable for Xpert Xpress SARS-CoV-2/FLU/RSV testing.  Fact Sheet for Patients: BloggerCourse.com  Fact Sheet for Healthcare Providers: SeriousBroker.it  This test is not yet approved or cleared by the [place] FDA and has been authorized for detection and/or diagnosis of SARS-CoV-2 by FDA under an Emergency Use Authorization (EUA). This EUA will remain in effect (meaning this test can be used) for the duration of the COVID-19 declaration under Section 564(b)(1) of the Act, 21 U.S.C. section 360bbb-3(b)(1), unless the authorization is terminated or revoked.     Resp Syncytial Virus by PCR NEGATIVE NEGATIVE Final    Comment: (NOTE) Fact Sheet for Patients: BloggerCourse.com  Fact Sheet for Healthcare Providers: SeriousBroker.it  This test is  not yet approved or cleared by the [place] FDA and has been authorized for detection and/or diagnosis of SARS-CoV-2 by FDA under an Emergency Use Authorization (EUA). This EUA will remain in effect (meaning this test can be used) for the duration of the COVID-19 declaration under Section 564(b)(1) of the Act, 21 U.S.C. section 360bbb-3(b)(1), unless the authorization is terminated or revoked.  Performed at [facility], [address] [place], [place] [address]   Culture, blood (routine x 2)     Status: None   Collection Time: 04/12/23 11:37 PM   Specimen: BLOOD  Result Value Ref Range Status   Specimen Description BLOOD SITE NOT SPECIFIED  Final   Special Requests   Final    BOTTLES DRAWN AEROBIC  AND ANAEROBIC Blood Culture adequate volume   Culture   Final    NO GROWTH 5 DAYS Performed at [facility], [address], [place], [place] [address]    Report Status 04/18/2023 FINAL  Final  Culture, blood (routine x 2)     Status: None   Collection Time: 04/12/23 11:58 PM   Specimen: BLOOD RIGHT ARM  Result Value Ref Range Status   Specimen Description BLOOD RIGHT ARM  Final   Special Requests   Final    BOTTLES DRAWN AEROBIC AND ANAEROBIC Blood Culture adequate volume   Culture   Final    NO GROWTH 5 DAYS Performed at [facility], [address], [place], [place] [address]    Report Status 04/18/2023 FINAL  Final  Urine Culture (for pregnant, neutropenic or urologic patients or patients with an indwelling urinary catheter)     Status: None   Collection Time: 04/13/23  8:26 AM   Specimen: Urine, Clean Catch  Result Value Ref Range Status   Specimen Description URINE, CLEAN CATCH  Final   Special Requests NONE  Final   Culture   Final    NO GROWTH Performed at [facility], [address] [place], [place] [address]    Report Status 04/14/2023 FINAL  Final     Radiology Studies: DG Abd 1 View Result Date: 04/20/2023 CLINICAL DATA:   Ureteral calculus. EXAM: ABDOMEN - 1 VIEW COMPARISON:  April 17, 2023.  April 13, 2023. FINDINGS: The bowel gas pattern is normal. Large amount of contrast is noted in nondilated colon. No definite radio-opaque calculi are seen, but may be obscured by due to the large amount of contrast present. IMPRESSION: No abnormal bowel dilatation. Electronically Signed   By: Lupita Raider M.D.   On: 04/20/2023 11:57   DG Swallowing Func-Speech Pathology Result Date: 04/19/2023 Table formatting from the original result was not included. Modified Barium Swallow Study Patient Details Name: Anthony Ball MRN: [id number] Date of Birth: [birth year] Today's Date: 04/19/2023 HPI/PMH: HPI: Anthony Ball is a 89 y.o. male with history of COPD, dementia, hypothyroidism who was recently evaluated by cardiologist for chest pain and had stress test on 04/02/2013 which was unremarkable with normal LV was brought to the ER after patient was complaining of shortness of breath and productive cough.  In the ER patient was hypoxic requiring 2 L oxygen with chest x-ray showing concerning features for infiltrates  Patient was started on empiric antibiotics for pneumonia. Found to have SBO resolved per MD noted 1/28. MD note says pt "ddid cough some with drinking water." Clinical Impression: Clinical Impression: Oral dysphagia marked by decreased oral cohesion with posterior spill to pharynx, delayed transit, minimal lingual residue and slow and prolonged mastication with solid. There was premature spill of thin liquid that fell into pt's trachea prior to swallow initiating which was sensed with immediate cough not clearing the airway (PAS 7). Chin tuck posture reduced to material penetrating above vocal cords (PAS 3). In addition he exhibited reduced tongue base retraction, anterior hyoid excursion leading to vallecular and pyriform sinus residue. There was partial pharyngoesophageal segment distention with partial obstruction of flow. Cues to  swallow a second time did reduce residue to mild. Nectar thick liquid was penetrated but ejected during the swallow (PAS 2) and one instance nectar residue from pharynx spilled into vestibule above vocal cords with spontaneous cough to clear. Recommend Dys 2 (minced/fine chopped), nectar thick liquids, swallow twice,  clear throat during meals and crush meds. Pt will need full supervision/assist to follow strategies due to history of dementia. ST will continue to follow. Factors that may increase risk of adverse event in presence of aspiration Rubye Oaks & Clearance Coots 2021): Factors that may increase risk of adverse event in presence of aspiration Rubye Oaks & Clearance Coots 2021): Reduced cognitive function Recommendations/Plan: Swallowing Evaluation Recommendations Swallowing Evaluation Recommendations Recommendations: PO diet PO Diet Recommendation: Dysphagia 2 (Finely chopped); Mildly thick liquids (Level 2, nectar thick) Liquid Administration via: Cup; Straw Medication Administration: Crushed with puree Supervision: Patient able to self-feed; Full supervision/cueing for swallowing strategies Swallowing strategies  : Small bites/sips; Slow rate; Multiple dry swallows after each bite/sip; Clear throat intermittently Postural changes: Position pt fully upright for meals Oral care recommendations: Oral care BID (2x/day) Treatment Plan Treatment Plan Treatment recommendations: Therapy as outlined in treatment plan below Follow-up recommendations: Skilled nursing-short term rehab (<3 hours/day) Functional status assessment: Patient has had a recent decline in their functional status and demonstrates the ability to make significant improvements in function in a reasonable and predictable amount of time. Treatment frequency: Min 2x/week Treatment duration: 2 weeks Interventions: Patient/family education; Compensatory techniques; Diet toleration management by SLP Recommendations Recommendations for follow up therapy are one component of  a multi-disciplinary discharge planning process, led by the attending physician.  Recommendations may be updated based on patient status, additional functional criteria and insurance authorization. Assessment: Orofacial Exam: Orofacial Exam Oral Cavity: Oral Hygiene: WFL Oral Cavity - Dentition: Dentures, top; Missing dentition (missiing some lower posterior, poor condition) Orofacial Anatomy: WFL Oral Motor/Sensory Function: WFL Anatomy: Anatomy: WFL Boluses Administered: Boluses Administered Boluses Administered: Thin liquids (Level 0); Mildly thick liquids (Level 2, nectar thick); Moderately thick liquids (Level 3, honey thick); Puree; Solid  Oral Impairment Domain: Oral Impairment Domain Lip Closure: No labial escape Tongue control during bolus hold: Posterior escape of less than half of bolus Bolus preparation/mastication: Slow prolonged chewing/mashing with complete recollection Bolus transport/lingual motion: Delayed initiation of tongue motion (oral holding) Oral residue: Trace residue lining oral structures Location of oral residue : Tongue Initiation of pharyngeal swallow : Pyriform sinuses  Pharyngeal Impairment Domain: Pharyngeal Impairment Domain Soft palate elevation: No bolus between soft palate (SP)/pharyngeal wall (PW) Laryngeal elevation: Partial superior movement of thyroid cartilage/partial approximation of arytenoids to epiglottic petiole Anterior hyoid excursion: Partial anterior movement Epiglottic movement: Complete inversion Laryngeal vestibule closure: Incomplete, narrow column air/contrast in laryngeal vestibule Pharyngeal stripping wave : Present - diminished Pharyngeal contraction (A/P view only): N/A Pharyngoesophageal segment opening: Partial distention/partial duration, partial obstruction of flow Tongue base retraction: Narrow column of contrast or air between tongue base and PPW Pharyngeal residue: Collection of residue within or on pharyngeal structures Location of pharyngeal  residue: Valleculae; Pyriform sinuses  Esophageal Impairment Domain: Esophageal Impairment Domain Esophageal clearance upright position: Esophageal retention (mild) Pill: Pill Consistency administered: Puree Puree: Impaired (see clinical impressions) Penetration/Aspiration Scale Score: Penetration/Aspiration Scale Score 1.  Material does not enter airway: Pill; Solid; Moderately thick liquids (Level 3, honey thick) 2.  Material enters airway, remains ABOVE vocal cords then ejected out: Mildly thick liquids (Level 2, nectar thick) 3.  Material enters airway, remains ABOVE vocal cords and not ejected out: Mildly thick liquids (Level 2, nectar thick) (from residue) 7.  Material enters airway, passes BELOW cords and not ejected out despite cough attempt by patient: Thin liquids (Level 0) Compensatory Strategies: Compensatory Strategies Compensatory strategies: Yes Straw: Effective Effective Straw: Mildly thick liquid (Level 2, nectar thick) Multiple swallows:  Effective Effective Multiple Swallows: Puree Chin tuck: Ineffective Ineffective Chin Tuck: Thin liquid (Level 0)   General Information: Caregiver present: No  Diet Prior to this Study: Other (Comment); Thin liquids (Level 0) (soft)   Temperature : Normal   Respiratory Status: WFL   Supplemental O2: None (Room air)   History of Recent Intubation: No  Behavior/Cognition: Alert; Cooperative; Pleasant mood; Requires cueing Self-Feeding Abilities: Able to self-feed Baseline vocal quality/speech: Normal No data recorded Volitional Swallow: Able to elicit Exam Limitations: No limitations Goal Planning: Prognosis for improved oropharyngeal function: Fair (fiar) Barriers to Reach Goals: Cognitive deficits No data recorded No data recorded Consulted and agree with results and recommendations: Patient; Other (comment) (pt has dementia) Pain: Pain Assessment Pain Assessment: No/denies pain Faces Pain Scale: 0 Breathing: 0 Negative Vocalization: 0 Facial Expression: 0 Body  Language: 0 Consolability: 0 PAINAD Score: 0 End of Session: Start Time:SLP Start Time (ACUTE ONLY): 0115 Stop Time: SLP Stop Time (ACUTE ONLY): 0134 Time Calculation:SLP Time Calculation (min) (ACUTE ONLY): 19 min Charges: SLP Evaluations $ SLP Speech Visit: 1 Visit SLP Evaluations $BSS Swallow: 1 Procedure $MBS Swallow: 1 Procedure SLP visit diagnosis: SLP Visit Diagnosis: Dysphagia, pharyngoesophageal phase (R13.14); Dysphagia, oral phase (R13.11) Past Medical History: Past Medical History: Diagnosis Date  COPD (chronic obstructive pulmonary disease) (HCC)   Emphysema   GERD (gastroesophageal reflux disease)   Seasonal allergies  Past Surgical History: Past Surgical History: Procedure Laterality Date  CARDIAC CATHETERIZATION    PROSTATE SURGERY   Royce Macadamia 04/19/2023, 3:01 PM  DG CHEST PORT 1 VIEW Result Date: 04/19/2023 CLINICAL DATA:  Shortness of breath. EXAM: PORTABLE CHEST 1 VIEW COMPARISON:  CT chest dated April 13, 2023. Chest radiograph dated April 12, 2023. FINDINGS: The heart size and mediastinal contours are within normal limits. Bibasilar, right-greater-than-left, airspace opacities, increased on the left compared to the prior radiograph dated 04/12/2023. No pleural effusion or pneumothorax. No acute osseous abnormality. IMPRESSION: Bibasilar right-greater-than-left airspace opacities, increased at the left lung base. Electronically Signed   By: Hart Robinsons M.D.   On: 04/19/2023 08:18    Scheduled Meds:  doxazosin  1 mg Oral Daily   levothyroxine  25 mcg Oral QAC breakfast   memantine  10 mg Oral Daily   pantoprazole  40 mg Oral Daily   QUEtiapine  25 mg Oral BID   Continuous Infusions:   LOS: 7 days   Time spent: 40 minutes  Carollee Herter, DO  [facility]  04/20/2023, 12:58 PM

## 2023-04-20 NOTE — Plan of Care (Signed)
   Problem: Education: Goal: Knowledge of General Education information will improve Description: Including pain rating scale, medication(s)/side effects and non-pharmacologic comfort measures Outcome: Progressing   Problem: Health Behavior/Discharge Planning: Goal: Ability to manage health-related needs will improve Outcome: Progressing   Problem: Clinical Measurements: Goal: Ability to maintain clinical measurements within normal limits will improve Outcome: Progressing Goal: Will remain free from infection Outcome: Progressing Goal: Diagnostic test results will improve Outcome: Progressing Goal: Respiratory complications will improve Outcome: Progressing Goal: Cardiovascular complication will be avoided Outcome: Progressing   Problem: Activity: Goal: Risk for activity intolerance will decrease Outcome: Progressing   Problem: Nutrition: Goal: Adequate nutrition will be maintained Outcome: Progressing   Problem: Coping: Goal: Level of anxiety will decrease Outcome: Progressing   Problem: Elimination: Goal: Will not experience complications related to bowel motility Outcome: Progressing Goal: Will not experience complications related to urinary retention Outcome: Progressing   Problem: Pain Managment: Goal: General experience of comfort will improve and/or be controlled Outcome: Progressing   Problem: Safety: Goal: Ability to remain free from injury will improve Outcome: Progressing   Problem: Skin Integrity: Goal: Risk for impaired skin integrity will decrease Outcome: Progressing   Problem: Activity: Goal: Ability to tolerate increased activity will improve Outcome: Progressing   Problem: Clinical Measurements: Goal: Ability to maintain a body temperature in the normal range will improve Outcome: Progressing   Problem: Respiratory: Goal: Ability to maintain adequate ventilation will improve Outcome: Progressing Goal: Ability to maintain a clear airway  will improve Outcome: Progressing   Problem: Safety: Goal: Non-violent Restraint(s) Outcome: Progressing

## 2023-04-20 NOTE — Progress Notes (Signed)
Mobility Specialist Progress Note:    04/20/23 1211  Mobility  Activity Ambulated with assistance in hallway  Level of Assistance Minimal assist, patient does 75% or more  Assistive Device Front wheel walker  Distance Ambulated (ft) 100 ft  Activity Response Tolerated well  Mobility Referral Yes  Mobility visit 1 Mobility  Mobility Specialist Start Time (ACUTE ONLY) 1125  Mobility Specialist Stop Time (ACUTE ONLY) 1141  Mobility Specialist Time Calculation (min) (ACUTE ONLY) 16 min   Pt received in bed agreeable to mobility. Pt required MinA during session d/t mild unsteadiness during ambulation.no c/o throughout. Returned to room w/o fault. Cal bell and personal belongings in reach. All needs met w/ call bell and personal belongings in reach. Bed alarm on.  Thompson Grayer Mobility Specialist  Please contact vis Secure Chat or  Rehab Office [phone]

## 2023-04-20 NOTE — Progress Notes (Signed)
Speech Language Pathology Treatment: Dysphagia  Patient Details Name: Anthony Ball MRN: [id number] DOB: 23-Jun-1934 Today's Date: 04/20/2023 Time: 0902-0922 SLP Time Calculation (min) (ACUTE ONLY): 20 min  Assessment / Plan / Recommendation Clinical Impression  Pt who has a suspected chronic dysphagia seen following yesterday's MBS with breakfast meal in which pt able to feed himself with min prompts due to cognition/attention. Pt is unable to recall strategies due to dementia and sign posted on wall in front of him. He continues to exhibit prolonged mastication and bolus preparation however functional with Dys 2 texture and independently using lingual sweep to remove potential residue. He exhibited several reflexive throat clears and one cough throughout session which is to be expected. He was able to produce a second swallow and throat clear when requested. There was decreased s/s aspiration noted today that diet and liquids have been modified although he will have some degree of ongoing aspiration risk. Recommend continue Dys 2, nectar thick, pills crushed and full supervision for strategies. ST will continue to treat while on acute and will need continued ST at SNF.    HPI HPI: Anthony Ball is a 89 y.o. male with history of COPD, dementia, hypothyroidism who was recently evaluated by cardiologist for chest pain and had stress test on 04/02/2013 which was unremarkable with normal LV was brought to the ER after patient was complaining of shortness of breath and productive cough.  In the ER patient was hypoxic requiring 2 L oxygen with chest x-ray showing concerning features for infiltrates  Patient was started on empiric antibiotics for pneumonia. Found to have SBO resolved per MD noted 1/28. MD note says pt "ddid cough some with drinking water."      SLP Plan  Continue with current plan of care      Recommendations for follow up therapy are one component of a multi-disciplinary discharge  planning process, led by the attending physician.  Recommendations may be updated based on patient status, additional functional criteria and insurance authorization.    Recommendations  Diet recommendations: Dysphagia 2 (fine chop);Nectar-thick liquid Liquids provided via: Straw;Cup Medication Administration: Crushed with puree Supervision: Patient able to self feed;Full supervision/cueing for compensatory strategies Compensations: Slow rate;Small sips/bites;Multiple dry swallows after each bite/sip;Clear throat intermittently Postural Changes and/or Swallow Maneuvers: Seated upright 90 degrees                  Oral care BID   Frequent or constant Supervision/Assistance Dysphagia, pharyngoesophageal phase (R13.14);Dysphagia, oral phase (R13.11)     Continue with current plan of care     Royce Macadamia  04/20/2023, 9:28 AM

## 2023-04-20 NOTE — Progress Notes (Signed)
Subjective: No acute events overnight.  Mr. Lewelling is becoming more oriented.  We briefly reviewed his case and plan, which he admitted he was having a difficult time following but entrusted [place] to do what ever we need to.  Objective: Vital signs in last 24 hours: Temp:  [97.5 F (36.4 C)-98 F (36.7 C)] 97.5 F (36.4 C) (01/31 0849) Pulse Rate:  [67-82] 67 (01/31 0849) Resp:  [18] 18 (01/31 0504) BP: (106-127)/(62-75) 114/64 (01/31 0849) SpO2:  [91 %-93 %] 91 % (01/31 0849)  Assessment/Plan: # Right UPJ stone. Renal function remains preserved. KUB collected did not visualize stone.  Retained contrast lower yield of imaging.  Can consider CT on an outpatient basis Continue Flomax Patient to follow-up in office in 2-3 weeks.  Intake/Output from previous day: 01/30 0701 - 01/31 0700 In: 118 [P.O.:118] Out: -   Intake/Output this shift: No intake/output data recorded.  Physical Exam:  General: Alert and oriented CV: No cyanosis Lungs: equal chest rise   Lab Results: Recent Labs    04/18/23 0625  HGB 13.6  HCT 40.3   BMET Recent Labs    04/18/23 0625  NA 137  K 3.7  CL 102  CO2 24  GLUCOSE 87  BUN 7*  CREATININE 0.71  CALCIUM 8.5*     Studies/Results: DG Abd 1 View Result Date: 04/20/2023 CLINICAL DATA:  Ureteral calculus. EXAM: ABDOMEN - 1 VIEW COMPARISON:  April 17, 2023.  April 13, 2023. FINDINGS: The bowel gas pattern is normal. Large amount of contrast is noted in nondilated colon. No definite radio-opaque calculi are seen, but may be obscured by due to the large amount of contrast present. IMPRESSION: No abnormal bowel dilatation. Electronically Signed   By: Lupita Raider M.D.   On: 04/20/2023 11:57   DG Swallowing Func-Speech Pathology Result Date: 04/19/2023 Table formatting from the original result was not included. Modified Barium Swallow Study Patient Details Name: RIAAN TOLEDO MRN: [id number] Date of Birth: Aug 23, 1934 Today's Date:  04/19/2023 HPI/PMH: HPI: PRAVEEN COIA is a 89 y.o. male with history of COPD, dementia, hypothyroidism who was recently evaluated by cardiologist for chest pain and had stress test on 04/02/2013 which was unremarkable with normal LV was brought to the ER after patient was complaining of shortness of breath and productive cough.  In the ER patient was hypoxic requiring 2 L oxygen with chest x-ray showing concerning features for infiltrates  Patient was started on empiric antibiotics for pneumonia. Found to have SBO resolved per MD noted 1/28. MD note says pt "ddid cough some with drinking water." Clinical Impression: Clinical Impression: Oral dysphagia marked by decreased oral cohesion with posterior spill to pharynx, delayed transit, minimal lingual residue and slow and prolonged mastication with solid. There was premature spill of thin liquid that fell into pt's trachea prior to swallow initiating which was sensed with immediate cough not clearing the airway (PAS 7). Chin tuck posture reduced to material penetrating above vocal cords (PAS 3). In addition he exhibited reduced tongue base retraction, anterior hyoid excursion leading to vallecular and pyriform sinus residue. There was partial pharyngoesophageal segment distention with partial obstruction of flow. Cues to swallow a second time did reduce residue to mild. Nectar thick liquid was penetrated but ejected during the swallow (PAS 2) and one instance nectar residue from pharynx spilled into vestibule above vocal cords with spontaneous cough to clear. Recommend Dys 2 (minced/fine chopped), nectar thick liquids, swallow twice, clear throat during meals  and crush meds. Pt will need full supervision/assist to follow strategies due to history of dementia. ST will continue to follow. Factors that may increase risk of adverse event in presence of aspiration Rubye Oaks & Clearance Coots 2021): Factors that may increase risk of adverse event in presence of aspiration Rubye Oaks &  Clearance Coots 2021): Reduced cognitive function Recommendations/Plan: Swallowing Evaluation Recommendations Swallowing Evaluation Recommendations Recommendations: PO diet PO Diet Recommendation: Dysphagia 2 (Finely chopped); Mildly thick liquids (Level 2, nectar thick) Liquid Administration via: Cup; Straw Medication Administration: Crushed with puree Supervision: Patient able to self-feed; Full supervision/cueing for swallowing strategies Swallowing strategies  : Small bites/sips; Slow rate; Multiple dry swallows after each bite/sip; Clear throat intermittently Postural changes: Position pt fully upright for meals Oral care recommendations: Oral care BID (2x/day) Treatment Plan Treatment Plan Treatment recommendations: Therapy as outlined in treatment plan below Follow-up recommendations: Skilled nursing-short term rehab (<3 hours/day) Functional status assessment: Patient has had a recent decline in their functional status and demonstrates the ability to make significant improvements in function in a reasonable and predictable amount of time. Treatment frequency: Min 2x/week Treatment duration: 2 weeks Interventions: Patient/family education; Compensatory techniques; Diet toleration management by SLP Recommendations Recommendations for follow up therapy are one component of a multi-disciplinary discharge planning process, led by the attending physician.  Recommendations may be updated based on patient status, additional functional criteria and insurance authorization. Assessment: Orofacial Exam: Orofacial Exam Oral Cavity: Oral Hygiene: WFL Oral Cavity - Dentition: Dentures, top; Missing dentition (missiing some lower posterior, poor condition) Orofacial Anatomy: WFL Oral Motor/Sensory Function: WFL Anatomy: Anatomy: WFL Boluses Administered: Boluses Administered Boluses Administered: Thin liquids (Level 0); Mildly thick liquids (Level 2, nectar thick); Moderately thick liquids (Level 3, honey thick); Puree; Solid  Oral  Impairment Domain: Oral Impairment Domain Lip Closure: No labial escape Tongue control during bolus hold: Posterior escape of less than half of bolus Bolus preparation/mastication: Slow prolonged chewing/mashing with complete recollection Bolus transport/lingual motion: Delayed initiation of tongue motion (oral holding) Oral residue: Trace residue lining oral structures Location of oral residue : Tongue Initiation of pharyngeal swallow : Pyriform sinuses  Pharyngeal Impairment Domain: Pharyngeal Impairment Domain Soft palate elevation: No bolus between soft palate (SP)/pharyngeal wall (PW) Laryngeal elevation: Partial superior movement of thyroid cartilage/partial approximation of arytenoids to epiglottic petiole Anterior hyoid excursion: Partial anterior movement Epiglottic movement: Complete inversion Laryngeal vestibule closure: Incomplete, narrow column air/contrast in laryngeal vestibule Pharyngeal stripping wave : Present - diminished Pharyngeal contraction (A/P view only): N/A Pharyngoesophageal segment opening: Partial distention/partial duration, partial obstruction of flow Tongue base retraction: Narrow column of contrast or air between tongue base and PPW Pharyngeal residue: Collection of residue within or on pharyngeal structures Location of pharyngeal residue: Valleculae; Pyriform sinuses  Esophageal Impairment Domain: Esophageal Impairment Domain Esophageal clearance upright position: Esophageal retention (mild) Pill: Pill Consistency administered: Puree Puree: Impaired (see clinical impressions) Penetration/Aspiration Scale Score: Penetration/Aspiration Scale Score 1.  Material does not enter airway: Pill; Solid; Moderately thick liquids (Level 3, honey thick) 2.  Material enters airway, remains ABOVE vocal cords then ejected out: Mildly thick liquids (Level 2, nectar thick) 3.  Material enters airway, remains ABOVE vocal cords and not ejected out: Mildly thick liquids (Level 2, nectar thick) (from  residue) 7.  Material enters airway, passes BELOW cords and not ejected out despite cough attempt by patient: Thin liquids (Level 0) Compensatory Strategies: Compensatory Strategies Compensatory strategies: Yes Straw: Effective Effective Straw: Mildly thick liquid (Level 2, nectar thick) Multiple swallows: Effective Effective Multiple  Swallows: Puree Chin tuck: Ineffective Ineffective Chin Tuck: Thin liquid (Level 0)   General Information: Caregiver present: No  Diet Prior to this Study: Other (Comment); Thin liquids (Level 0) (soft)   Temperature : Normal   Respiratory Status: WFL   Supplemental O2: None (Room air)   History of Recent Intubation: No  Behavior/Cognition: Alert; Cooperative; Pleasant mood; Requires cueing Self-Feeding Abilities: Able to self-feed Baseline vocal quality/speech: Normal No data recorded Volitional Swallow: Able to elicit Exam Limitations: No limitations Goal Planning: Prognosis for improved oropharyngeal function: Fair (fiar) Barriers to Reach Goals: Cognitive deficits No data recorded No data recorded Consulted and agree with results and recommendations: Patient; Other (comment) (pt has dementia) Pain: Pain Assessment Pain Assessment: No/denies pain Faces Pain Scale: 0 Breathing: 0 Negative Vocalization: 0 Facial Expression: 0 Body Language: 0 Consolability: 0 PAINAD Score: 0 End of Session: Start Time:SLP Start Time (ACUTE ONLY): 0115 Stop Time: SLP Stop Time (ACUTE ONLY): 0134 Time Calculation:SLP Time Calculation (min) (ACUTE ONLY): 19 min Charges: SLP Evaluations $ SLP Speech Visit: 1 Visit SLP Evaluations $BSS Swallow: 1 Procedure $MBS Swallow: 1 Procedure SLP visit diagnosis: SLP Visit Diagnosis: Dysphagia, pharyngoesophageal phase (R13.14); Dysphagia, oral phase (R13.11) Past Medical History: Past Medical History: Diagnosis Date  COPD (chronic obstructive pulmonary disease) (HCC)   Emphysema   GERD (gastroesophageal reflux disease)   Seasonal allergies  Past Surgical History:  Past Surgical History: Procedure Laterality Date  CARDIAC CATHETERIZATION    PROSTATE SURGERY   Royce Macadamia 04/19/2023, 3:01 PM  DG CHEST PORT 1 VIEW Result Date: 04/19/2023 CLINICAL DATA:  Shortness of breath. EXAM: PORTABLE CHEST 1 VIEW COMPARISON:  CT chest dated April 13, 2023. Chest radiograph dated April 12, 2023. FINDINGS: The heart size and mediastinal contours are within normal limits. Bibasilar, right-greater-than-left, airspace opacities, increased on the left compared to the prior radiograph dated 04/12/2023. No pleural effusion or pneumothorax. No acute osseous abnormality. IMPRESSION: Bibasilar right-greater-than-left airspace opacities, increased at the left lung base. Electronically Signed   By: Hart Robinsons M.D.   On: 04/19/2023 08:18      LOS: 7 days   Elmon Kirschner, NP [facility] Pager: [phone]  04/20/2023, 2:08 PM

## 2023-04-20 NOTE — Progress Notes (Signed)
AVS completed; printed and placed at front desk for PTAR.

## 2023-04-21 DIAGNOSIS — J189 Pneumonia, unspecified organism: Secondary | ICD-10-CM | POA: Diagnosis not present

## 2023-04-21 NOTE — Plan of Care (Signed)
  Problem: Clinical Measurements: Goal: Ability to maintain clinical measurements within normal limits will improve Outcome: Progressing Goal: Will remain free from infection Outcome: Progressing Goal: Respiratory complications will improve Outcome: Progressing Goal: Cardiovascular complication will be avoided Outcome: Progressing   Problem: Nutrition: Goal: Adequate nutrition will be maintained Outcome: Progressing   Problem: Coping: Goal: Level of anxiety will decrease Outcome: Progressing   Problem: Elimination: Goal: Will not experience complications related to bowel motility Outcome: Progressing Goal: Will not experience complications related to urinary retention Outcome: Progressing   Problem: Pain Managment: Goal: General experience of comfort will improve and/or be controlled Outcome: Progressing   Problem: Safety: Goal: Ability to remain free from injury will improve Outcome: Progressing   Problem: Skin Integrity: Goal: Risk for impaired skin integrity will decrease Outcome: Progressing   Problem: Clinical Measurements: Goal: Ability to maintain a body temperature in the normal range will improve Outcome: Progressing   Problem: Respiratory: Goal: Ability to maintain adequate ventilation will improve Outcome: Progressing Goal: Ability to maintain a clear airway will improve Outcome: Progressing

## 2023-04-21 NOTE — Progress Notes (Signed)
  Progress Note   Patient: Anthony Ball JYN:[id number] DOB: 1934-05-21 DOA: 04/12/2023     8 DOS: the patient was seen and examined on 04/21/2023 at 9:50AM      Brief hospital course:  89 y.o. M with dementia, COPD, hypothyroidism who presented with shortness of breath, found to have pneumonia, influenza, SBO, and renal stone.  Please see discharge summary by Dr. Imogene Burn from 1/31 for further details.     Assessment and Plan: * CAP (community acquired pneumonia) Completed treatment, resolved    Influenza A with pneumonia Symptoms resolved   Ureteral stone Neurology were consulted, they recommend no intervention - Continue Flomax  SBO (small bowel obstruction) (HCC) General Surgery were consulted, the patient had small bowel study, which showed contrast passing through to the colon.  He is now tolerating oral diet without difficulty and has had no pain or vomiting.   Dementia (HCC) -Delirium precautions -Continue memantine, Seroquel  Hypothyroidism -Continue levothyroxine  COPD (chronic obstructive pulmonary disease) (HCC) Respiratory status normal -Continue as needed nebulizers  Hypertension - Continue doxazosin         Subjective: Patient has no complaints.  He is pleasant to talk to, but is oriented only to self.  No fever, respiratory symptoms.  No reported abdominal pain or vomiting.     Physical Exam: BP 121/65   Pulse 70   Temp (!) 97.5 F (36.4 C) (Oral)   Resp (!) 22   Ht 5\' 6"  (1.676 m)   Wt 56.2 kg   SpO2 90%   BMI 20.00 kg/m   Elderly adult male, sitting up in bed, interactive and appropriate RRR, soft systolic murmur, no peripheral edema Respiratory rate normal, lungs clear without rales or wheezes Abdomen soft without tenderness palpation or guarding, no ascites or distention Attention normal, affect normal, judgment and insight appear severely impaired but at baseline, generalized weakness but symmetric strength      Data  Reviewed: Abdomen x-ray yesterday normal  Family Communication: None present    Disposition: Status is: Inpatient The patient was admitted for influenza, pneumonia, small bowel obstruction, and kidney stone  This is all stabilized, and he is medically appropriate for discharge when safe disposition can be found, but he will need significant rehabilitation to return to his prior level of function and rehabilitation in acute rehab is sought        Author: Alberteen Sam, MD 04/21/2023 3:32 PM  For on call review [email or web address].

## 2023-04-21 NOTE — TOC Progression Note (Signed)
Transition of Care [facility]) - Progression Note    Patient Details  Name: MISHA ANTONINI MRN: [id number] Date of Birth: 1934-11-28  [facility]) CM/SW Contact  Dellie Burns [place], [place] Phone Number: 04/21/2023, 8:22 AM  Clinical Narrative:  Home and Community/UHC auth remains pending at this time, under medical review. Will provide updates as available.   Dellie Burns, MSW, LCSW [phone] (coverage)       Expected Discharge Plan: Skilled Nursing Facility Barriers to Discharge: Insurance Authorization, SNF Pending bed offer, Continued Medical Work up  Expected Discharge Plan and Services In-house Referral: Clinical Social Work     Living arrangements for the past 2 months: Single Family Home Expected Discharge Date: 04/20/23                                     Social Determinants of Health (SDOH) Interventions SDOH Screenings   Food Insecurity: No Food Insecurity (04/13/2023)  Housing: Low Risk  (04/13/2023)  Transportation Needs: No Transportation Needs (04/13/2023)  Utilities: Not At Risk (04/13/2023)  Social Connections: Unknown (04/13/2023)  Tobacco Use: Unknown (04/13/2023)    Readmission Risk Interventions     No data to display

## 2023-04-22 DIAGNOSIS — J9601 Acute respiratory failure with hypoxia: Secondary | ICD-10-CM | POA: Diagnosis not present

## 2023-04-22 DIAGNOSIS — J09X1 Influenza due to identified novel influenza A virus with pneumonia: Secondary | ICD-10-CM | POA: Diagnosis not present

## 2023-04-22 NOTE — Progress Notes (Signed)
  Progress Note   Patient: Anthony Ball:[id number] DOB: March 26, 1934 DOA: 04/12/2023     9 DOS: the patient was seen and examined on 04/22/2023 at 9:50AM      Brief hospital course:  89 y.o. M with dementia, COPD, hypothyroidism who presented with shortness of breath, found to have pneumonia, influenza, SBO, and renal stone.  Please see discharge summary by Dr. Imogene Burn from 1/31 for further details.  Waiting to go to SNF.     Assessment and Plan: * CAP (community acquired pneumonia) Completed treatment, resolved    Influenza A with pneumonia Symptoms resolved   Ureteral stone Urology were consulted, they recommend no intervention - Continue Flomax -- Urology to schedule outpatient follow-up.  SBO (small bowel obstruction) (HCC) General Surgery were consulted, the patient had small bowel study, which showed contrast passing through to the colon.  He is now tolerating oral diet without difficulty and has had no pain or vomiting.   Dementia (HCC) -Delirium precautions -Continue memantine, Seroquel  Hypothyroidism -Continue levothyroxine  COPD (chronic obstructive pulmonary disease) (HCC) Respiratory status normal -Continue as needed nebulizers  Hypertension - Continue doxazosin         Subjective: Patient seen and examined.  No overnight events.  Patient himself tells me he has some cough.  Afebrile.  Oriented to himself only.     Physical Exam: BP 117/71 (BP Location: Left Arm)   Pulse 74   Temp 97.7 F (36.5 C) (Oral)   Resp 18   Ht 5\' 6"  (1.676 m)   Wt 56.2 kg   SpO2 93%   BMI 20.00 kg/m   General: Looks fairly comfortable.  Alert and awake but oriented to himself only.  Pleasant to interaction but confused. Cardiovascular: S1-S2 normal.  Regular rate rhythm. Respiratory: Bilateral clear.  No added sounds.  Intermittent cough on deep breathing. Gastrointestinal: Soft.  Nontender.  Bowel sound present.     Data Reviewed: No new data.  Family  Communication: None present    Disposition: Status is: Inpatient The patient was admitted for influenza, pneumonia, small bowel obstruction, and kidney stone  Patient is clinically stabilizing.  Will need inpatient therapies at a SNF.  Transfer when bed available.        Author: Dorcas Carrow, MD 04/22/2023 10:40 AM  For on call review [email or web address].

## 2023-04-22 NOTE — Progress Notes (Signed)
   04/22/23 1256  Mobility  Activity Ambulated with assistance in hallway  Level of Assistance Contact guard assist, steadying assist  Assistive Device Front wheel walker  Activity Response Tolerated fair  Mobility Referral Yes  Mobility visit 1 Mobility  Mobility Specialist Start Time (ACUTE ONLY) 1230  Mobility Specialist Stop Time (ACUTE ONLY) 1256  Mobility Specialist Time Calculation (min) (ACUTE ONLY) 26 min   Mobility Specialist: Progress Note  Post-Mobility:    HR 80s, SpO2 73-89% 2L  Pt agreeable to mobility session - received in bed. C/o SOB throughout, RN placed pt on 2L on to improved SPO2 recovery, pt with cough during inhale PLB. Returned to bed with all needs met - call bell within reach. Chair alarm on.   Barnie Mort, BS Mobility Specialist Please contact via SecureChat or  Rehab office at [phone].

## 2023-04-22 NOTE — Plan of Care (Signed)

## 2023-04-22 NOTE — TOC Progression Note (Signed)
Transition of Care [facility]) - Progression Note    Patient Details  Name: Anthony Ball MRN: [id number] Date of Birth: 13-Sep-1934  Transition of Care [facility]) CM/SW Contact  Dellie Burns [place], [place] Phone Number: 04/22/2023, 8:08 AM  Clinical Narrative: Per Home and Community/UHC, auth remains pending at this time under medical review but has not been assigned to a provider yet. Will continue to check status and provide updates as available.   Dellie Burns, MSW, LCSW [phone] (coverage)        Expected Discharge Plan: Skilled Nursing Facility Barriers to Discharge: Insurance Authorization, SNF Pending bed offer, Continued Medical Work up  Expected Discharge Plan and Services In-house Referral: Clinical Social Work     Living arrangements for the past 2 months: Single Family Home Expected Discharge Date: 04/20/23                                     Social Determinants of Health (SDOH) Interventions SDOH Screenings   Food Insecurity: No Food Insecurity (04/13/2023)  Housing: Low Risk  (04/13/2023)  Transportation Needs: No Transportation Needs (04/13/2023)  Utilities: Not At Risk (04/13/2023)  Social Connections: Unknown (04/13/2023)  Tobacco Use: Unknown (04/13/2023)    Readmission Risk Interventions     No data to display

## 2023-04-23 DIAGNOSIS — J9601 Acute respiratory failure with hypoxia: Secondary | ICD-10-CM | POA: Diagnosis not present

## 2023-04-23 DIAGNOSIS — J09X1 Influenza due to identified novel influenza A virus with pneumonia: Secondary | ICD-10-CM | POA: Diagnosis not present

## 2023-04-23 MED ORDER — DM-GUAIFENESIN ER 30-600 MG PO TB12
1.0000 | ORAL_TABLET | Freq: Two times a day (BID) | ORAL | Status: DC
  Administered 2023-04-23 – 2023-05-02 (×19): 1 via ORAL
  Filled 2023-04-23 (×19): qty 1

## 2023-04-23 NOTE — Progress Notes (Signed)
Physical Therapy Treatment Patient Details Name: Anthony Ball MRN: [id number] DOB: Apr 29, 1934 Today's Date: 04/23/2023   History of Present Illness Anthony Ball is a 89 y.o. male  patient to hospital with shortness of breath and productive cough; influenza a with pneumonia, partial SBO with history of COPD, dementia, hypothyroidism    PT Comments  Pt received in supine and agreeable to session. Pt requires increased cues throughout session for awareness and safety. Pt able to tolerate gait trial with RW support and CGA for safety. Pt demonstrates increased instability during turns and requires cues for obstacle negotiation. Pt requires a seated rest break after gait trial due to DOE and demonstrates difficulty following cues for pursed lip breathing. Pt able to tolerate additional standing trial with marching and lateral steps at EOB before returning to supine. Pt continues to benefit from PT services to progress toward functional mobility goals.     If plan is discharge home, recommend the following: A little help with walking and/or transfers;A lot of help with bathing/dressing/bathroom;Direct supervision/assist for financial management;Assistance with cooking/housework;Assist for transportation;Supervision due to cognitive status;Help with stairs or ramp for entrance   Can travel by private vehicle        Equipment Recommendations  Rolling walker (2 wheels);BSC/3in1    Recommendations for Other Services       Precautions / Restrictions Precautions Precautions: Fall Precaution Comments: O2 sats Restrictions Weight Bearing Restrictions Per Provider Order: No     Mobility  Bed Mobility Overal bed mobility: Modified Independent Bed Mobility: Supine to Sit, Sit to Supine           General bed mobility comments: increased time    Transfers Overall transfer level: Needs assistance Equipment used: Rolling walker (2 wheels) Transfers: Sit to/from Stand Sit to Stand:  Supervision           General transfer comment: from EOB with cues for hand placement    Ambulation/Gait Ambulation/Gait assistance: Contact guard assist Gait Distance (Feet): 100 Feet Assistive device: Rolling walker (2 wheels) Gait Pattern/deviations: Step-through pattern, Trunk flexed, Decreased stride length, Drifts right/left       General Gait Details: slow gait with decreased awareness on L requiring cues to avoid obstacles in hallway. Cues for upright posture and increased step length. Increased instability and NBOS during turns      Balance Overall balance assessment: Needs assistance Sitting-balance support: Feet supported Sitting balance-Leahy Scale: Good Sitting balance - Comments: sitting EOB   Standing balance support: Bilateral upper extremity supported, During functional activity Standing balance-Leahy Scale: Fair Standing balance comment: with RW support                            Cognition Arousal: Alert Behavior During Therapy: Impulsive Overall Cognitive Status: No family/caregiver present to determine baseline cognitive functioning                                 General Comments: increased cues for safety        Exercises General Exercises - Lower Extremity Hip Flexion/Marching: AROM, Standing, Both, 10 reps    General Comments General comments (skin integrity, edema, etc.): VSS on 3L      Pertinent Vitals/Pain Pain Assessment Pain Assessment: No/denies pain     PT Goals (current goals can now be found in the care plan section) Acute Rehab PT Goals Patient Stated Goal: Agraable to  getting back to bed PT Goal Formulation: Patient unable to participate in goal setting Time For Goal Achievement: 04/29/23 Progress towards PT goals: Progressing toward goals    Frequency    Min 1X/week       AM-PAC PT "6 Clicks" Mobility   Outcome Measure  Help needed turning from your back to your side while in a flat  bed without using bedrails?: None Help needed moving from lying on your back to sitting on the side of a flat bed without using bedrails?: None Help needed moving to and from a bed to a chair (including a wheelchair)?: A Little Help needed standing up from a chair using your arms (e.g., wheelchair or bedside chair)?: A Little Help needed to walk in hospital room?: A Little Help needed climbing 3-5 steps with a railing? : A Lot 6 Click Score: 19    End of Session Equipment Utilized During Treatment: Gait belt;Oxygen Activity Tolerance: Patient tolerated treatment well;Patient limited by fatigue Patient left: in bed;with bed alarm set;with call bell/phone within reach Nurse Communication: Mobility status PT Visit Diagnosis: Unsteadiness on feet (R26.81);Other abnormalities of gait and mobility (R26.89);Muscle weakness (generalized) (M62.81)     Time: 1550-1610 PT Time Calculation (min) (ACUTE ONLY): 20 min  Charges:    $Gait Training: 8-22 mins PT General Charges $$ ACUTE PT VISIT: 1 Visit                     Johny Shock, PTA Acute Rehabilitation Services Secure Chat Preferred  [phone]    Johny Shock 04/23/2023, 4:26 PM

## 2023-04-23 NOTE — Progress Notes (Signed)
Called [facility] insurance for peer to peer review for patient placement to SNF for short term rehab and ultimately go home with family. Discussed with Dr Beverely Pace . They do not believe patient needing supervision and help needs to go SNF as he can do intermittent therapies at home.  Requested updated therapy if anything has changed until 1030 central 04/24/2023.

## 2023-04-23 NOTE — TOC Progression Note (Addendum)
Transition of Care [facility]) - Progression Note    Patient Details  Name: Anthony Ball MRN: [id number] Date of Birth: 05/31/1934  Transition [facility]) CM/SW Contact  Lesslie Mckeehan A [place], [place] Phone Number: 04/23/2023, 2:03 PM  Clinical Narrative:     Update 1637: Updated PT note uploaded to [facility] portal for review.   Update 1616 CSW was notified by [facility] that provider completed P2P but would need updated PT note uploaded to H&[facility] portal by 10:30am Central time. CSW notified PT of request to assist with authorization.   Update 1429: CSW was updated by [facility] and [facility] of offer for peer 2 peer with insurance. Contact [phone] opt. 5. Deadline to complete Tuesday at 9:30am. Provider notified.    Pt still under medical review with insurance, authorization status pending. Pt still has facility at [facility] and can DC when authorization is approved.     TOC will continue to follow.   Expected Discharge Plan: Skilled Nursing Facility Barriers to Discharge: Insurance Authorization, SNF Pending bed offer, Continued Medical Work up  Expected Discharge Plan and Services In-house Referral: Clinical Social Work     Living arrangements for the past 2 months: Single Family Home Expected Discharge Date: 04/20/23                                     Social Determinants of Health (SDOH) Interventions SDOH Screenings   Food Insecurity: No Food Insecurity (04/13/2023)  Housing: Low Risk  (04/13/2023)  Transportation Needs: No Transportation Needs (04/13/2023)  Utilities: Not At Risk (04/13/2023)  Social Connections: Unknown (04/13/2023)  Tobacco Use: Unknown (04/13/2023)    Readmission Risk Interventions     No data to display

## 2023-04-23 NOTE — Progress Notes (Signed)
  Progress Note   Patient: Anthony Ball QMV:[id number] DOB: Oct 27, 1934 DOA: 04/12/2023     10 DOS: the patient was seen and examined on 04/23/2023 at 9:50AM      Brief hospital course:  89 y.o. M with dementia, COPD, hypothyroidism who presented with shortness of breath, found to have pneumonia, influenza, SBO, and renal stone. Medically stabilized.  Waiting to go to a skilled nursing facility.    Assessment and Plan: * CAP (community acquired pneumonia) Completed treatment, resolved.  He will continue to need incentive spirometry, mucolytic's and bronchodilator therapy.    Influenza A with pneumonia Symptoms resolved.  Continue to respite therapy as above.   Ureteral stone Urology were consulted, they recommend no intervention - Continue Flomax -- Urology to schedule outpatient follow-up.  SBO (small bowel obstruction) (HCC) General Surgery were consulted, the patient had small bowel study, which showed contrast passing through to the colon.  He is now tolerating oral diet without difficulty and has had no pain or vomiting.   Dementia (HCC) -Delirium precautions -Continue memantine, Seroquel  Hypothyroidism -Continue levothyroxine  COPD (chronic obstructive pulmonary disease) (HCC) Respiratory status normal -Continue as needed nebulizers  Hypertension - Continue doxazosin   Patient lives at home.  He is deconditioned due to acute medical issues.  He will need short-term rehab before returning home independent.      Subjective: Patient seen and examined.  Pleasant and interactive today.  Patient tells me he is forgetful.  Still significant dry cough.     Physical Exam: BP 119/67 (BP Location: Left Arm)   Pulse 85   Temp 97.8 F (36.6 C) (Oral)   Resp 18   Ht 5\' 6"  (1.676 m)   Wt 56.2 kg   SpO2 96%   BMI 20.00 kg/m   General: Looks comfortable at rest. Cardiovascular: S1-S2 normal.  Regular rate rhythm. Respiratory: Bilateral clear.  Conducted upper  airway sounds.  Dry cough on deep breathing. Gastrointestinal: Soft and nontender.  Bowel sound present. Ext: No swelling or edema.  No cyanosis. Neuro: Alert and awake.  Oriented to himself.  Moves all extremities.      Data Reviewed: No new data.  Family Communication: None present    Disposition: Status is: Inpatient The patient was admitted for influenza, pneumonia, small bowel obstruction, and kidney stone  Patient is clinically stabilizing.  Will need inpatient therapies at a SNF.  Transfer when bed available.        Author: Dorcas Carrow, MD 04/23/2023 10:44 AM  For on call review [email or web address].

## 2023-04-23 NOTE — Progress Notes (Signed)
Mobility Specialist: Progress Note   04/23/23 1209  Mobility  Activity Ambulated with assistance in hallway  Level of Assistance Contact guard assist, steadying assist  Assistive Device Front wheel walker;None  Distance Ambulated (ft) 100 ft  Activity Response Tolerated well  Mobility Referral Yes  Mobility visit 1 Mobility  Mobility Specialist Start Time (ACUTE ONLY) 1146  Mobility Specialist Stop Time (ACUTE ONLY) 1204  Mobility Specialist Time Calculation (min) (ACUTE ONLY) 18 min    Pre-Mobility: SpO2 94% 3LO2 During Mobility: SpO2 91-92% 3LO2 Post-Mobility: SpO2 91% 3LO2  Pt was agreeable to mobility session - received in bed. Weaker today, ambulated down the hallway with RW, and returned to room without AD but reaching out for UE support on hallway handrails and chairs. MinG throughout d/t unsteadiness. Stated he felt like he was having some trouble breathing, SpO2 WFL throughout. Returned to room without fault. Left in bed with all needs met, call bell in reach. Bed alarm on.  Maurene Capes Mobility Specialist Please contact via SecureChat or Rehab office at [phone]

## 2023-04-23 NOTE — Progress Notes (Signed)
SCD machines not available . Order is pending.

## 2023-04-24 DIAGNOSIS — J09X1 Influenza due to identified novel influenza A virus with pneumonia: Secondary | ICD-10-CM | POA: Diagnosis not present

## 2023-04-24 DIAGNOSIS — J9601 Acute respiratory failure with hypoxia: Secondary | ICD-10-CM | POA: Diagnosis not present

## 2023-04-24 DIAGNOSIS — E43 Unspecified severe protein-calorie malnutrition: Secondary | ICD-10-CM | POA: Diagnosis present

## 2023-04-24 LAB — C-REACTIVE PROTEIN: CRP: 6.6 mg/dL — ABNORMAL HIGH (ref <1.0)

## 2023-04-24 MED ORDER — ADULT MULTIVITAMIN W/MINERALS CH
1.0000 | ORAL_TABLET | Freq: Every day | ORAL | Status: DC
  Administered 2023-04-24: 1
  Filled 2023-04-24: qty 1

## 2023-04-24 MED ORDER — ADULT MULTIVITAMIN W/MINERALS CH
1.0000 | ORAL_TABLET | Freq: Every day | ORAL | Status: DC
  Administered 2023-04-25 – 2023-05-14 (×20): 1 via ORAL
  Filled 2023-04-24 (×20): qty 1

## 2023-04-24 NOTE — Progress Notes (Signed)
 Occupational Therapy Treatment Patient Details Name: Anthony Ball MRN: [id number] DOB: 25-Jan-1935 Today's Date: 04/24/2023   History of present illness Anthony Ball is a 89 y.o. male  patient to hospital with shortness of breath and productive cough; influenza a with pneumonia, partial SBO with history of COPD, dementia, hypothyroidism   OT comments  Pt progressing towards goals, completing 2 standing grooming tasks and toileting with up to min A, CGA for mobility with RW. VSS on 3L O2 throughout. Pt needing min cues for safety/redirection throughout, confirmed some home info, however pt still unable to provide full home setup. States he lives alone but is across the street from his sister (unsure level of assist she can provide). Pt presenting with impairments listed below, will follow acutely. Patient will benefit from continued inpatient follow up therapy, <3 hours/day to maximize safety/ind with ADL/functional mobility.       If plan is discharge home, recommend the following:  A little help with walking and/or transfers;A lot of help with bathing/dressing/bathroom;Assistance with cooking/housework;Direct supervision/assist for medications management;Direct supervision/assist for financial management;Help with stairs or ramp for entrance;Assist for transportation;Supervision due to cognitive status   Equipment Recommendations  Other (comment) (defer)    Recommendations for Other Services PT consult    Precautions / Restrictions Precautions Precautions: Fall Precaution Comments: O2 sats Restrictions Weight Bearing Restrictions Per Provider Order: No       Mobility Bed Mobility Overal bed mobility: Modified Independent                  Transfers Overall transfer level: Needs assistance Equipment used: Rolling walker (2 wheels) Transfers: Sit to/from Stand Sit to Stand: Contact guard assist                 Balance Overall balance assessment: Needs  assistance Sitting-balance support: Feet supported Sitting balance-Leahy Scale: Good Sitting balance - Comments: sitting EOB   Standing balance support: Bilateral upper extremity supported, During functional activity Standing balance-Leahy Scale: Fair Standing balance comment: stands static at sink without RW support                           ADL either performed or assessed with clinical judgement   ADL Overall ADL's : Needs assistance/impaired     Grooming: Wash/dry face;Brushing hair;Standing Grooming Details (indicate cue type and reason): standing at sink                 Toilet Transfer: Contact guard assist;Ambulation;Rolling walker (2 wheels);Regular Teacher, Adult Education Details (indicate cue type and reason): standing urination at commode Toileting- Clothing Manipulation and Hygiene: Minimal assistance;Sit to/from stand Toileting - Clothing Manipulation Details (indicate cue type and reason): assist to hold clothing/gait belt out of way     Functional mobility during ADLs: Contact guard assist;Rolling walker (2 wheels)      Extremity/Trunk Assessment Upper Extremity Assessment Upper Extremity Assessment: Generalized weakness   Lower Extremity Assessment Lower Extremity Assessment: Defer to PT evaluation        Vision   Vision Assessment?: No apparent visual deficits   Perception Perception Perception: Not tested   Praxis Praxis Praxis: Not tested    Cognition Arousal: Alert Behavior During Therapy: Impulsive Overall Cognitive Status: No family/caregiver present to determine baseline cognitive functioning Area of Impairment: Orientation, Memory, Following commands, Safety/judgement, Awareness, Problem solving                 Orientation Level: Place, Situation, Time,  Disoriented to (pt states it is November)   Memory: Decreased short-term memory Following Commands: Follows one step commands inconsistently Safety/Judgement:  Decreased awareness of safety, Decreased awareness of deficits Awareness: Emergent Problem Solving: Slow processing          Exercises      Shoulder Instructions       General Comments VSS on 3L O2, satting 96% at end of session    Pertinent Vitals/ Pain       Pain Assessment Pain Assessment: No/denies pain  Home Living                                          Prior Functioning/Environment              Frequency  Min 1X/week        Progress Toward Goals  OT Goals(current goals can now be found in the care plan section)  Progress towards OT goals: Progressing toward goals  Acute Rehab OT Goals Patient Stated Goal: none stated OT Goal Formulation: With patient Time For Goal Achievement: 04/29/23 Potential to Achieve Goals: Good ADL Goals Pt Will Perform Upper Body Dressing: with contact guard assist;sitting Pt Will Perform Lower Body Dressing: with contact guard assist;sit to/from stand;sitting/lateral leans Pt Will Transfer to Toilet: with contact guard assist;ambulating;regular height toilet Additional ADL Goal #1: pt will follow 2 step command with min cues in prep for ADLs  Plan      Co-evaluation                 AM-PAC OT 6 Clicks Daily Activity     Outcome Measure   Help from another person eating meals?: A Little Help from another person taking care of personal grooming?: A Little Help from another person toileting, which includes using toliet, bedpan, or urinal?: A Little Help from another person bathing (including washing, rinsing, drying)?: A Lot Help from another person to put on and taking off regular upper body clothing?: A Little Help from another person to put on and taking off regular lower body clothing?: A Little 6 Click Score: 17    End of Session Equipment Utilized During Treatment: Gait belt;Rolling walker (2 wheels)  OT Visit Diagnosis: Unsteadiness on feet (R26.81);Other abnormalities of gait and  mobility (R26.89);Muscle weakness (generalized) (M62.81)   Activity Tolerance Patient tolerated treatment well   Patient Left in bed;with call bell/phone within reach;with bed alarm set   Nurse Communication Mobility status        Time: 8791-8772 OT Time Calculation (min): 19 min  Charges: OT General Charges $OT Visit: 1 Visit OT Treatments $Self Care/Home Management : 8-22 mins  Lilliona Blakeney K, OTD, OTR/L SecureChat Preferred Acute Rehab [phone]   Laneta POUR Koonce 04/24/2023, 12:52 PM

## 2023-04-24 NOTE — Plan of Care (Signed)

## 2023-04-24 NOTE — Progress Notes (Signed)
  Progress Note   Patient: Anthony Ball FMW:[id number] DOB: 10-20-1934 DOA: 04/12/2023     11 DOS: the patient was seen and examined on 04/24/2023 at 9:50AM      Brief hospital course:  89 y.o. M with dementia, COPD, hypothyroidism who presented with shortness of breath, found to have pneumonia, influenza, SBO, and renal stone. Medically stabilized.  No safe place to go.  SNF declined by the [employer].    Assessment and Plan: * CAP (community acquired pneumonia) Completed treatment, resolved.  He will continue to need incentive spirometry, mucolytic's and bronchodilator therapy. On modified diet.  Dysphagia 2 diet with nectar thick liquids.  Speech therapy continues to follow-up.    Influenza A with pneumonia Symptoms resolved.  Continue to respiratory therapy as above.   Ureteral stone Urology were consulted, they recommend no intervention - Continue Flomax  -- Urology to schedule outpatient follow-up.  SBO (small bowel obstruction) (HCC) General Surgery were consulted, the patient had small bowel study, which showed contrast passing through to the colon.  He is now tolerating oral diet without difficulty and has had no pain or vomiting.   Dementia (HCC) -Delirium precautions -Continue memantine , Seroquel   Hypothyroidism -Continue levothyroxine   COPD (chronic obstructive pulmonary disease) (HCC) Respiratory status normal -Continue as needed nebulizers  Hypertension - Continue doxazosin    Patient lives at home.  He is deconditioned due to acute medical issues.  He will need short-term rehab before returning home independent. Peer to peer review done and requested SNF approval with [employer], declined. Social worker to touch base with family about disposition plan.      Subjective: Patient seen and examined.  Poor historian.  Denies any complaints.  Still has some dry cough. Needs a lot of clue to function.    Physical Exam: BP 102/62 (BP Location:  Right Arm)   Pulse 68   Temp 98 F (36.7 C) (Oral)   Resp 18   Ht 5' 6 (1.676 m)   Wt 54.6 kg   SpO2 94%   BMI 19.43 kg/m   General: Looks fairly comfortable at rest. Cardiovascular: S1-S2 normal.   Respiratory: Bilateral clear.  Conducted upper airway sounds.  Dry cough on deep breathing. Gastrointestinal: Soft and nontender.  Bowel sounds present. Ext: No swelling or edema.  No cyanosis. Neuro: Alert and awake.  Oriented to himself.  Moves all extremities.      Data Reviewed: No new data.  Family Communication: None present    Disposition: Status is: Inpatient The patient was admitted for influenza, pneumonia, small bowel obstruction, and kidney stone  Patient is clinically stabilizing.  Will need inpatient therapies at a SNF or adequate support system at home to be in place before discharge.        Author: Renato Applebaum, MD 04/24/2023 10:14 AM  For on call review [email or web address].

## 2023-04-24 NOTE — Progress Notes (Signed)
 Mobility Specialist: Progress Note   04/24/23 1219  Mobility  Activity Ambulated with assistance in hallway  Level of Assistance Contact guard assist, steadying assist  Assistive Device Front wheel walker  Distance Ambulated (ft) 100 ft  Activity Response Tolerated well  Mobility Referral Yes  Mobility visit 1 Mobility  Mobility Specialist Start Time (ACUTE ONLY) 0845  Mobility Specialist Stop Time (ACUTE ONLY) 0902  Mobility Specialist Time Calculation (min) (ACUTE ONLY) 17 min    During Mobility: SpO2 95% 3LO2  Pt was agreeable to mobility session - received in bed. CG throughout ambulation. SpO2 WFL on 3LO2 throughout. MinG required d/t assistance with RW steering and difficulty following verbal cues for directions. Returned to room without fault. Left in bed with all needs met, call bell in reach. Bed alarm on.   Ileana Lute Mobility Specialist Please contact via SecureChat or Rehab office at [phone]

## 2023-04-24 NOTE — Progress Notes (Signed)
 Initial Nutrition Assessment  DOCUMENTATION CODES:   Severe malnutrition in context of chronic illness  INTERVENTION:  Magic cup TID with meals, each supplement provides 290 kcal and 9 grams of protein Mighty Shake TID with meals, each supplement provides 330 kcals and 9 grams of protein Continue dysphagia 2 diet and nectar thick liquids Ordered B6 and zinc  labs MVI with minerals daily  NUTRITION DIAGNOSIS:   Severe Malnutrition related to chronic illness as evidenced by severe muscle depletion, severe fat depletion.  GOAL:   Patient will meet greater than or equal to 90% of their needs  MONITOR:   PO intake, Weight trends, Supplement acceptance  REASON FOR ASSESSMENT:  LOS   ASSESSMENT:   Pt presented to ER with shortness of breath, productive cough, nausea/vomiting, and found to have partial SBO, pneumonia, renal stone, and influenza. Pt with PMH of dementia, COPD, hypothyroidism.   1/24: NG Tube placed for SBO 1/25: NG tube removed 1/30: MBS SLP recommends nectar thick liquids  Pt was NPO for first 5 days of admission and advanced to clear liquids on 1/28. Average of 56% of meals eaten since 1/30. Pt is unable to give nutrition hx and had trouble focusing on nutrition questions. Pt did report liking ice cream. RD will add magic cup to trays. Pt reported he lives alone and sister Joya) lives across street and will sometimes visit him.  During exam noticed seborrheic dermatitis in nasolabial folds. Based on degree of malnutrition suspect pt has inadequate intake. Assessing micronutrient labs to determine if a deficiency is contributing to appearance.  Pt currently weighs 54.6 kg and has been losing weight over the past 3 months. Pt has had an 11.4% wt loss since December, 2024, which is clinically significant for the time frame.  Meds: Synthroid  25 mcg, Protonix  40 mg  Labs: Glucose 87-154 (since admission)   NUTRITION - FOCUSED PHYSICAL EXAM:  Flowsheet Row Most  Recent Value  Orbital Region Severe depletion  Upper Arm Region Severe depletion  Thoracic and Lumbar Region Severe depletion  Buccal Region Moderate depletion  Temple Region Severe depletion  Clavicle Bone Region Severe depletion  Clavicle and Acromion Bone Region Severe depletion  Scapular Bone Region Severe depletion  Dorsal Hand Severe depletion  Patellar Region Severe depletion  Anterior Thigh Region Severe depletion  Posterior Calf Region Severe depletion  Edema (RD Assessment) None  Hair Reviewed  Eyes Reviewed  Mouth Reviewed  [seborrheic dermititis]  Skin Reviewed  Nails Reviewed       Diet Order:   Diet Order             DIET DYS 2           DIET DYS 2 Room service appropriate? No; Fluid consistency: Nectar Thick  Diet effective now                   EDUCATION NEEDS:   Education needs have been addressed  Skin:  Skin Assessment: Reviewed RN Assessment  Last BM:  2/3  Height:   Ht Readings from Last 1 Encounters:  04/24/23 5' 6 (1.676 m)    Weight:   Wt Readings from Last 1 Encounters:  04/24/23 54.6 kg    Ideal Body Weight:  64.5 kg  BMI:  Body mass index is 19.43 kg/m.  Estimated Nutritional Needs:   Kcal:  1500-1700  Protein:  80-95 g  Fluid:  1.5-1.7 L    Tinnie Ferraris, MS Dietetic Intern

## 2023-04-24 NOTE — Progress Notes (Signed)
 Speech Language Pathology Treatment: Dysphagia  Patient Details Name: Anthony Ball MRN: [id number] DOB: 18-Sep-1934 Today's Date: 04/24/2023 Time: 9080-9068 SLP Time Calculation (min) (ACUTE ONLY): 12 min  Assessment / Plan / Recommendation Clinical Impression  Pt observed while eating breakfast and is able to feed himself with minimal cues intermittently for smaller bites. He demonstrated prolonged mastication but able to transit with occasional minimal lingual residue that he clears with sips. He read strategies written on wall but needed frequent assist to implement due to dementia but stimulable when cued. There were intermittent immediate and reflexive throat clears that is expected and suspect is baseline with his chronic dysphagia. Since diet and liquids were modified there has been less frequent s/s aspiration. Pt will be discharged on Dys 2 (minced) texture and nectar thick liquids with follow up at SNF. Will continue ST while on acute venue.    HPI HPI: Anthony Ball is a 89 y.o. male with history of COPD, dementia, hypothyroidism who was recently evaluated by cardiologist for chest pain and had stress test on 04/02/2013 which was unremarkable with normal LV was brought to the ER after patient was complaining of shortness of breath and productive cough.  In the ER patient was hypoxic requiring 2 L oxygen with chest x-ray showing concerning features for infiltrates  Patient was started on empiric antibiotics for pneumonia. Found to have SBO resolved per MD noted 1/28. MD note says pt ddid cough some with drinking water.      SLP Plan  Continue with current plan of care      Recommendations for follow up therapy are one component of a multi-disciplinary discharge planning process, led by the attending physician.  Recommendations may be updated based on patient status, additional functional criteria and insurance authorization.    Recommendations  Diet recommendations: Dysphagia 2 (fine  chop);Nectar-thick liquid Liquids provided via: Straw;Cup Medication Administration: Crushed with puree Supervision: Patient able to self feed;Full supervision/cueing for compensatory strategies Postural Changes and/or Swallow Maneuvers: Seated upright 90 degrees                  Oral care BID   Frequent or constant Supervision/Assistance Dysphagia, pharyngoesophageal phase (R13.14);Dysphagia, oral phase (R13.11)     Continue with current plan of care     Anthony Ball  04/24/2023, 9:37 AM

## 2023-04-25 DIAGNOSIS — J9601 Acute respiratory failure with hypoxia: Secondary | ICD-10-CM | POA: Diagnosis not present

## 2023-04-25 DIAGNOSIS — J09X1 Influenza due to identified novel influenza A virus with pneumonia: Secondary | ICD-10-CM | POA: Diagnosis not present

## 2023-04-25 NOTE — Progress Notes (Signed)
 Mobility Specialist: Progress Note   04/25/23 1500  Mobility  Activity Ambulated with assistance in hallway  Level of Assistance Contact guard assist, steadying assist  Assistive Device Front wheel walker  Distance Ambulated (ft) 100 ft  Activity Response Tolerated well  Mobility Referral Yes  Mobility visit 1 Mobility  Mobility Specialist Start Time (ACUTE ONLY) 1345  Mobility Specialist Stop Time (ACUTE ONLY) 1400  Mobility Specialist Time Calculation (min) (ACUTE ONLY) 15 min    Pt was agreeable to mobility session - received in chair. MinG throughout; pt required mod cues for steering RW. Returned to room without fault. 1x occurrence of LOB when exiting the BR without RW but MS assisted regaining balance with gait belt. Left in bed with all needs met, call bell in reach. Bed alarm on.   Anthony Ball Mobility Specialist Please contact via SecureChat or Rehab office at [phone]

## 2023-04-25 NOTE — Progress Notes (Signed)
 Physical Therapy Treatment Patient Details Name: Anthony Ball MRN: [id number] DOB: 1934/12/26 Today's Date: 04/25/2023   History of Present Illness Anthony Ball is a 89 y.o. male  patient to hospital with shortness of breath and productive cough; influenza a with pneumonia, partial SBO with history of COPD, dementia, hypothyroidism    PT Comments  Pt received in supine and agreeable to session. Pt able to perform transfers and ambulation with up to CGA for safety, however continues to require increased cues throughout due to impaired cognition and decreased awareness. Pt demonstrates increased instability and difficulty with RW management during turns and obstacle negotiation. Pt will require 24/7 supervision at discharge for safety due to impaired cognition. Pt able to remain on RA throughout session with SpO2 >89% and pt left on RA at end of session with RN approval. Pt continues to benefit from PT services to progress toward functional mobility goals.     If plan is discharge home, recommend the following: A little help with walking and/or transfers;A lot of help with bathing/dressing/bathroom;Direct supervision/assist for financial management;Assistance with cooking/housework;Assist for transportation;Supervision due to cognitive status;Help with stairs or ramp for entrance   Can travel by private vehicle        Equipment Recommendations  Rolling walker (2 wheels);BSC/3in1    Recommendations for Other Services       Precautions / Restrictions Precautions Precautions: Fall Precaution Comments: O2 sats Restrictions Weight Bearing Restrictions Per Provider Order: No     Mobility  Bed Mobility Overal bed mobility: Modified Independent Bed Mobility: Supine to Sit     Supine to sit: Modified independent (Device/Increase time)          Transfers Overall transfer level: Needs assistance Equipment used: Rolling walker (2 wheels) Transfers: Sit to/from Stand Sit to Stand:  Supervision           General transfer comment: From low EOB    Ambulation/Gait Ambulation/Gait assistance: Contact guard assist Gait Distance (Feet): 120 Feet Assistive device: Rolling walker (2 wheels) Gait Pattern/deviations: Step-through pattern, Trunk flexed, Decreased stride length, Drifts right/left       General Gait Details: slow gait with decreased awareness on L requiring cues to avoid obstacles in hallway. Cues for upright posture, increased step length, and increased foot clearance. Increased instability and NBOS during turns. Cues for RW proximity during obstacle negotiation      Balance Overall balance assessment: Needs assistance Sitting-balance support: Feet supported Sitting balance-Leahy Scale: Good Sitting balance - Comments: sitting EOB   Standing balance support: Bilateral upper extremity supported, During functional activity Standing balance-Leahy Scale: Fair Standing balance comment: with RW support                            Cognition Arousal: Alert Behavior During Therapy: Flat affect Overall Cognitive Status: No family/caregiver present to determine baseline cognitive functioning                                 General Comments: increased cues for safety due to decreased        Exercises      General Comments General comments (skin integrity, edema, etc.): SpO2 >89% throughout session on RA. Pt left on RA at end of session with RN aware      Pertinent Vitals/Pain Pain Assessment Pain Assessment: No/denies pain     PT Goals (current goals can now be  found in the care plan section) Acute Rehab PT Goals Patient Stated Goal: Agraable to getting back to bed PT Goal Formulation: Patient unable to participate in goal setting Time For Goal Achievement: 04/29/23 Progress towards PT goals: Progressing toward goals    Frequency    Min 1X/week       AM-PAC PT 6 Clicks Mobility   Outcome Measure  Help  needed turning from your back to your side while in a flat bed without using bedrails?: None Help needed moving from lying on your back to sitting on the side of a flat bed without using bedrails?: None Help needed moving to and from a bed to a chair (including a wheelchair)?: A Little Help needed standing up from a chair using your arms (e.g., wheelchair or bedside chair)?: A Little Help needed to walk in hospital room?: A Little Help needed climbing 3-5 steps with a railing? : A Lot 6 Click Score: 19    End of Session Equipment Utilized During Treatment: Gait belt Activity Tolerance: Patient tolerated treatment well Patient left: in chair;with chair alarm set;with call bell/phone within reach Nurse Communication: Mobility status PT Visit Diagnosis: Unsteadiness on feet (R26.81);Other abnormalities of gait and mobility (R26.89);Muscle weakness (generalized) (M62.81)     Time: 9051-8990 PT Time Calculation (min) (ACUTE ONLY): 21 min  Charges:    $Gait Training: 8-22 mins PT General Charges $$ ACUTE PT VISIT: 1 Visit                     Darryle George, PTA Acute Rehabilitation Services Secure Chat Preferred  [phone]    Darryle George 04/25/2023, 10:36 AM

## 2023-04-25 NOTE — Progress Notes (Signed)
  Progress Note   Patient: Anthony Ball FMW:[id number] DOB: [birth year] DOA: 04/12/2023     12 DOS: the patient was seen and examined on 04/25/2023 at 9:50AM      Brief hospital course:  89 y.o. M with dementia, COPD, hypothyroidism who presented with shortness of breath, found to have pneumonia, influenza, SBO, and renal stone. Patient has medically stabilized.  Clinically improving.  SNF declined by the [employer]. Finding safe disposition home with families.    Assessment and Plan: * CAP (community acquired pneumonia) Completed treatment, resolved.  He will continue to need incentive spirometry, mucolytic's and bronchodilator therapy. On modified diet.  Dysphagia 2 diet with nectar thick liquids.  Speech therapy continues to follow-up.    Influenza A with pneumonia Symptoms resolved.  Continue to respiratory therapy as above.   Ureteral stone Urology were consulted, they recommend no intervention - Continue Flomax  -- Urology to schedule outpatient follow-up.  SBO (small bowel obstruction) (HCC) General Surgery were consulted, the patient had small bowel study, which showed contrast passing through to the colon.  He is now tolerating oral diet without difficulty and has had no pain or vomiting.   Dementia (HCC) -Delirium precautions -Continue memantine , Seroquel   Hypothyroidism -Continue levothyroxine   COPD (chronic obstructive pulmonary disease) (HCC) Respiratory status normal -Continue as needed nebulizers  Hypertension - Continue doxazosin   Lives at home.  Gradually improving.  SNF declined by the [employer]. Needs to go home with family supervision.      Subjective: Patient seen and examined.  Working with physical therapy.  Quite independent today.  Respiratory symptoms have improved.    Physical Exam: BP 113/68 (BP Location: Right Arm)   Pulse 66   Temp 97.6 F (36.4 C) (Oral)   Resp 17   Ht 5' 6 (1.676 m)   Wt 54.6 kg   SpO2 93%   BMI  19.43 kg/m   General: Looks comfortable after walking around in the hallway. Cardiovascular: S1-S2 normal.   Respiratory: Bilateral clear.  Occasional dry cough. Gastrointestinal: Soft and nontender.  Bowel sounds present. Ext: No swelling or edema.  No cyanosis. Neuro: Alert and awake.  Oriented to himself.  Moves all extremities.      Data Reviewed: No new data.  Family Communication: None present    Disposition: Status is: Inpatient The patient was admitted for influenza, pneumonia, small bowel obstruction, and kidney stone  Patient is clinically stabilizing.  Will need inpatient therapies at a SNF or adequate support system at home to be in place before discharge.        Author: Renato Applebaum, MD 04/25/2023 10:44 AM  For on call review [email or web address].

## 2023-04-25 NOTE — TOC Progression Note (Addendum)
 Transition of Care [facility]) - Progression Note    Patient Details  Name: Anthony Ball MRN: [id number] Date of Birth: 08/23/1934  [facility] CM/SW Contact  Rilyn Scroggs A Shylo Zamor, LCSW Phone Number: 04/25/2023, 1:30 PM  Clinical Narrative:     Update 04/26/23 1335 CSW was contacted by [facility] and spoke with [place]. She stated that she would reach out to family and start paperwork process for private pay to the facility. CSW provided contact information for pt's sisters to facility. They stated they would reach back out to CSW regarding update on admission paperwork and when pt can discharge to facility.    CSW reached out to pt's sister, Anthony Ball,  who stated they wanted to go to facility and pay privately at [facility]. CSW reached out to admissions at facility, left message with contact information and left VM with Bruna, liaison at facility to provide update on disposition. CSW to reach back out at a later time to get confirmation if bed is still available.     TOC will continue to follow.   Expected Discharge Plan: Skilled Nursing Facility Barriers to Discharge: Insurance Authorization, SNF Pending bed offer, Continued Medical Work up  Expected Discharge Plan and Services In-house Referral: Clinical Social Work     Living arrangements for the past 2 months: Single Family Home Expected Discharge Date: 04/20/23                                     Social Determinants of Health (SDOH) Interventions SDOH Screenings   Food Insecurity: No Food Insecurity (04/13/2023)  Housing: Low Risk  (04/13/2023)  Transportation Needs: No Transportation Needs (04/13/2023)  Utilities: Not At Risk (04/13/2023)  Social Connections: Unknown (04/13/2023)  Tobacco Use: Unknown (04/13/2023)    Readmission Risk Interventions     No data to display

## 2023-04-26 DIAGNOSIS — J9601 Acute respiratory failure with hypoxia: Secondary | ICD-10-CM | POA: Diagnosis not present

## 2023-04-26 DIAGNOSIS — J09X1 Influenza due to identified novel influenza A virus with pneumonia: Secondary | ICD-10-CM | POA: Diagnosis not present

## 2023-04-26 LAB — VITAMIN B6: Vitamin B6: 10.8 ug/L (ref 3.4–65.2)

## 2023-04-26 LAB — ZINC: Zinc: 57 ug/dL (ref 44–115)

## 2023-04-26 NOTE — Progress Notes (Signed)
 Occupational Therapy Treatment Patient Details Name: Anthony Ball MRN: [id number] DOB: 31-Jan-1935 Today's Date: 04/26/2023   History of present illness Anthony Ball is a 89 y.o. male  patient to hospital with shortness of breath and productive cough; influenza a with pneumonia, partial SBO with history of COPD, dementia, hypothyroidism   OT comments  Pt at this time was in recliner chair and agreeable to work with therapist. Pt at this time was able to complete toileting tasks with hand held assist and CGA with sit to stand transfers and he needed CGA to complete hand hygiene at sink to locate items as needed. He scored a 21 on the Short Blessed Test. When pt completing test could not recall information within the minute even with repeating the instruction. The sister Joya) came into the room and was educated about how he needs to have constant supervision for safety at this time. She reported there was cognition declines prior to this admission but unclear on level prior to this admission. As per note pt was decline for SNF now recommendation would be for [facility] services.       If plan is discharge home, recommend the following:  A little help with walking and/or transfers;A little help with bathing/dressing/bathroom;Assistance with cooking/housework;Assistance with feeding;Direct supervision/assist for medications management;Direct supervision/assist for financial management;Assist for transportation;Help with stairs or ramp for entrance;Supervision due to cognitive status   Equipment Recommendations  None recommended by OT    Recommendations for Other Services      Precautions / Restrictions Precautions Precautions: Fall Precaution Comments: O2 sats Restrictions Weight Bearing Restrictions Per Provider Order: No Other Position/Activity Restrictions: watch O2 sats on room air       Mobility Bed Mobility Overal bed mobility: Needs Assistance Bed Mobility: Sit to Supine     Supine  to sit: Modified independent (Device/Increase time)          Transfers Overall transfer level: Needs assistance Equipment used: Rolling walker (2 wheels) Transfers: Sit to/from Stand Sit to Stand: Supervision                 Balance Overall balance assessment: Needs assistance Sitting-balance support: Feet supported Sitting balance-Leahy Scale: Good Sitting balance - Comments: sitting EOB   Standing balance support: Single extremity supported Standing balance-Leahy Scale: Fair                             ADL either performed or assessed with clinical judgement   ADL Overall ADL's : Needs assistance/impaired Eating/Feeding: Set up;Sitting   Grooming: Wash/dry hands;Wash/dry face;Supervision/safety;Standing   Upper Body Bathing: Set up;Sitting   Lower Body Bathing: Contact guard assist;Sit to/from stand   Upper Body Dressing : Supervision/safety;Sitting   Lower Body Dressing: Contact guard assist;Sit to/from stand   Toilet Transfer: Contact guard assist   Toileting- Clothing Manipulation and Hygiene: Contact guard assist;Sit to/from stand       Functional mobility during ADLs: Contact guard assist      Extremity/Trunk Assessment Upper Extremity Assessment Upper Extremity Assessment: Generalized weakness            Vision       Perception     Praxis      Cognition Arousal: Alert Behavior During Therapy: Flat affect Overall Cognitive Status: Difficult to assess Area of Impairment: Orientation, Attention, Memory, Following commands, Safety/judgement, Awareness, Problem solving  Orientation Level: Disoriented to, Place, Time, Situation   Memory: Decreased short-term memory Following Commands: Follows one step commands inconsistently Safety/Judgement: Decreased awareness of safety, Decreased awareness of deficits Awareness: Emergent Problem Solving: Slow processing, Decreased initiation, Difficulty sequencing,  Requires verbal cues General Comments: Pt when attempting to complete short blessed test was only able to report the correct month and year. scored 21        Exercises      Shoulder Instructions       General Comments Pt on RA able to remain in 91-94%    Pertinent Vitals/ Pain       Pain Assessment Pain Assessment: No/denies pain  Home Living                                          Prior Functioning/Environment              Frequency  Min 1X/week        Progress Toward Goals  OT Goals(current goals can now be found in the care plan section)  Progress towards OT goals: Progressing toward goals  Acute Rehab OT Goals Patient Stated Goal: to get stronger OT Goal Formulation: With patient Time For Goal Achievement: 05/10/23 Potential to Achieve Goals: Good ADL Goals Pt Will Perform Upper Body Dressing: with contact guard assist;sitting Pt Will Perform Lower Body Dressing: with contact guard assist;sit to/from stand;sitting/lateral leans Pt Will Transfer to Toilet: with contact guard assist;ambulating;regular height toilet Additional ADL Goal #1: pt will follow 2 step command with min cues in prep for ADLs  Plan      Co-evaluation                 AM-PAC OT 6 Clicks Daily Activity     Outcome Measure   Help from another person eating meals?: A Little Help from another person taking care of personal grooming?: A Little Help from another person toileting, which includes using toliet, bedpan, or urinal?: A Little Help from another person bathing (including washing, rinsing, drying)?: A Little Help from another person to put on and taking off regular upper body clothing?: A Little Help from another person to put on and taking off regular lower body clothing?: A Little 6 Click Score: 18    End of Session Equipment Utilized During Treatment: Gait belt  OT Visit Diagnosis: Unsteadiness on feet (R26.81);Other abnormalities of gait and  mobility (R26.89);Muscle weakness (generalized) (M62.81)   Activity Tolerance Patient tolerated treatment well   Patient Left in bed;with call bell/phone within reach;with bed alarm set;with family/visitor present   Nurse Communication Mobility status        Time: 8774-8742 OT Time Calculation (min): 32 min  Charges: OT General Charges $OT Visit: 1 Visit OT Treatments $Self Care/Home Management : 23-37 mins  Warrick POUR OTR/L  Acute Rehab Services  [phone] office number   Warrick Berber 04/26/2023, 1:07 PM

## 2023-04-26 NOTE — Progress Notes (Signed)
 Mobility Specialist Progress Note:    04/26/23 1000  Mobility  Activity Ambulated with assistance in hallway  Level of Assistance Contact guard assist, steadying assist  Assistive Device Front wheel walker  Distance Ambulated (ft) 200 ft  Activity Response Tolerated well  Mobility Referral Yes  Mobility visit 1 Mobility  Mobility Specialist Start Time (ACUTE ONLY) [id number]  Mobility Specialist Stop Time (ACUTE ONLY) 0955  Mobility Specialist Time Calculation (min) (ACUTE ONLY) 13 min   Received pt in bed having no complaints and agreeable to mobility. Pt was asymptomatic throughout ambulation and returned to room w/o fault. Left in chair w/ call bell in reach and all needs met. Chair alarm on.   Thersia Minder Mobility Specialist  Please contact vis Secure Chat or  Rehab Office [phone]

## 2023-04-26 NOTE — Progress Notes (Signed)
  Progress Note   Patient: Anthony Ball FMW:[id number] DOB: 1934/10/23 DOA: 04/12/2023     13 DOS: the patient was seen and examined on 04/26/2023 at 9:50AM      Brief hospital course:  89 y.o. M with dementia, COPD, hypothyroidism who presented with shortness of breath, found to have pneumonia, influenza, SBO, and renal stone. Patient has medically stabilized.  Clinically improving.  SNF declined by the [employer]. Finding safe disposition home with families.    Assessment and Plan: * CAP (community acquired pneumonia) Completed treatment, resolved.  He will continue to need incentive spirometry, mucolytic's and bronchodilator therapy. On modified diet.  Dysphagia 2 diet with nectar thick liquids.  Speech therapy continues to follow-up.    Influenza A with pneumonia Symptoms resolved.  Continue to respiratory therapy as above.   Ureteral stone Urology were consulted, they recommend no intervention - Continue Flomax  -- Urology to schedule outpatient follow-up.  SBO (small bowel obstruction) (HCC) General Surgery were consulted, the patient had small bowel study, which showed contrast passing through to the colon.  He is now tolerating oral diet without difficulty and has had no pain or vomiting.   Dementia (HCC) -Delirium precautions -Continue memantine , Seroquel   Hypothyroidism -Continue levothyroxine   COPD (chronic obstructive pulmonary disease) (HCC) Respiratory status normal -Continue as needed nebulizers  Hypertension - Continue doxazosin   Lives at home.  Gradually improving.  SNF declined by the [employer]. Unable to go home. Family arranging for private pay nursing home.  Medically stable to discharge whenever bed available.      Subjective: Seen and examined taking a nap after breakfast.  Denies any complaints.  Nurses has no overnight concerns.    Physical Exam: BP 120/68 (BP Location: Right Arm)   Pulse 89   Temp 98.8 F (37.1 C) (Oral)    Resp 17   Ht 5' 6 (1.676 m)   Wt 54.6 kg   SpO2 93%   BMI 19.43 kg/m   General: Looks comfortable and sleepy now. Cardiovascular: S1-S2 normal.   Respiratory: Bilateral clear.  Occasional dry cough. Gastrointestinal: Soft and nontender.  Bowel sounds present. Ext: No swelling or edema.  No cyanosis. Neuro: Alert and awake.  Oriented to himself.  Moves all extremities.      Data Reviewed: No new data.  Family Communication: None present    Disposition: Status is: Inpatient The patient was admitted for influenza, pneumonia, small bowel obstruction, and kidney stone  Patient is clinically stabilizing.  Will need inpatient therapies at a SNF or adequate support system at home to be in place before discharge.        Author: Renato Applebaum, MD 04/26/2023 11:10 AM  For on call review [email or web address].

## 2023-04-27 DIAGNOSIS — J9601 Acute respiratory failure with hypoxia: Secondary | ICD-10-CM | POA: Diagnosis not present

## 2023-04-27 DIAGNOSIS — J09X1 Influenza due to identified novel influenza A virus with pneumonia: Secondary | ICD-10-CM | POA: Diagnosis not present

## 2023-04-27 NOTE — Progress Notes (Signed)
 Physical Therapy Treatment Patient Details Name: Anthony Ball MRN: [id number] DOB: [birth year] Today's Date: 04/27/2023   History of Present Illness Anthony Ball is a 89 y.o. male  patient to hospital with shortness of breath and productive cough; influenza a with pneumonia, partial SBO with history of COPD, dementia, hypothyroidism    PT Comments  Pt received in supine and agreeable to session. Pt demonstrates good progress towards functional mobility goals, however continues to be limited by impaired cognition and awareness. Pt requires increased cues and up to CGA for safety during mobility tasks due to decreased awareness and increased difficulty with obstacle negotiation and turns. Pt continues to benefit from PT services to progress toward functional mobility goals.     If plan is discharge home, recommend the following: A little help with walking and/or transfers;A lot of help with bathing/dressing/bathroom;Direct supervision/assist for financial management;Assistance with cooking/housework;Assist for transportation;Supervision due to cognitive status;Help with stairs or ramp for entrance   Can travel by private vehicle        Equipment Recommendations  Rolling walker (2 wheels);BSC/3in1    Recommendations for Other Services       Precautions / Restrictions Precautions Precautions: Fall Precaution Comments: O2 sats Restrictions Weight Bearing Restrictions Per Provider Order: No     Mobility  Bed Mobility Overal bed mobility: Needs Assistance Bed Mobility: Supine to Sit           General bed mobility comments: increased time    Transfers Overall transfer level: Needs assistance Equipment used: Rolling walker (2 wheels) Transfers: Sit to/from Stand Sit to Stand: Supervision           General transfer comment: From low EOB with supervision for safety    Ambulation/Gait Ambulation/Gait assistance: Contact guard assist, Supervision Gait Distance (Feet): 150  Feet Assistive device: Rolling walker (2 wheels) Gait Pattern/deviations: Step-through pattern, Trunk flexed, Decreased stride length, Drifts right/left       General Gait Details: slow gait with decreased awareness on L requiring cues to avoid obstacles in hallway. Cues for upright posture and increased step length. Increased trunk flexion and distance from RW during turns requiring cues.       Balance Overall balance assessment: Needs assistance Sitting-balance support: Feet supported Sitting balance-Leahy Scale: Good Sitting balance - Comments: sitting EOB   Standing balance support: Bilateral upper extremity supported, During functional activity Standing balance-Leahy Scale: Fair Standing balance comment: with RW support                            Cognition Arousal: Alert Behavior During Therapy: Flat affect Overall Cognitive Status: Difficult to assess                                          Exercises      General Comments        Pertinent Vitals/Pain Pain Assessment Pain Assessment: No/denies pain     PT Goals (current goals can now be found in the care plan section) Acute Rehab PT Goals Patient Stated Goal: Agraable to getting back to bed PT Goal Formulation: Patient unable to participate in goal setting Time For Goal Achievement: 04/29/23 Progress towards PT goals: Progressing toward goals    Frequency    Min 1X/week       AM-PAC PT 6 Clicks Mobility   Outcome Measure  Help  needed turning from your back to your side while in a flat bed without using bedrails?: None Help needed moving from lying on your back to sitting on the side of a flat bed without using bedrails?: None Help needed moving to and from a bed to a chair (including a wheelchair)?: A Little Help needed standing up from a chair using your arms (e.g., wheelchair or bedside chair)?: A Little Help needed to walk in hospital room?: A Little Help needed  climbing 3-5 steps with a railing? : A Lot 6 Click Score: 19    End of Session Equipment Utilized During Treatment: Gait belt Activity Tolerance: Patient tolerated treatment well Patient left: in chair;with chair alarm set;with call bell/phone within reach Nurse Communication: Mobility status PT Visit Diagnosis: Unsteadiness on feet (R26.81);Other abnormalities of gait and mobility (R26.89);Muscle weakness (generalized) (M62.81)     Time: 9054-8994 PT Time Calculation (min) (ACUTE ONLY): 20 min  Charges:    $Gait Training: 8-22 mins PT General Charges $$ ACUTE PT VISIT: 1 Visit                     Darryle George, PTA Acute Rehabilitation Services Secure Chat Preferred  [phone]    Darryle George 04/27/2023, 10:47 AM

## 2023-04-27 NOTE — Plan of Care (Signed)

## 2023-04-27 NOTE — Progress Notes (Signed)
  Progress Note   Patient: Anthony Ball FMW:[id number] DOB: [birth year] DOA: 04/12/2023     14 DOS: the patient was seen and examined on 04/27/2023 at 9:50AM      Brief hospital course:  89 y.o. M with dementia, COPD, hypothyroidism who presented with shortness of breath, found to have pneumonia, influenza, SBO, and renal stone. Patient has medically stabilized.  Clinically improving.  SNF declined by the [employer]. Patient lives alone at home.  Sister is working on private pay nursing home.    Assessment and Plan: * CAP (community acquired pneumonia) Completed treatment, resolved.  He will continue to need incentive spirometry, mucolytic's and bronchodilator therapy. On modified diet.  Dysphagia 2 diet with nectar thick liquids.  Speech therapy continues to follow-up.  Influenza A with pneumonia Symptoms resolved.  Continue to respiratory therapy as above.   Ureteral stone Urology were consulted, they recommend no intervention - Continue Flomax  -- Urology to schedule outpatient follow-up.  SBO (small bowel obstruction) (HCC) General Surgery were consulted, the patient had small bowel study, which showed contrast passing through to the colon.  He is now tolerating oral diet without difficulty and has had no pain or vomiting.   Dementia (HCC) -Delirium precautions -Continue memantine , Seroquel .  Stable.  Hypothyroidism -Continue levothyroxine   COPD (chronic obstructive pulmonary disease) (HCC) Respiratory status normal -Continue as needed nebulizers  Hypertension - Continue doxazosin   Lives at home.  Gradually improving.  SNF declined by the [employer]. Unable to go home. Family arranging for private pay nursing home.  Medically stable to discharge whenever bed available.      Subjective: Patient seen and examined.  Denies any complaints.  Pleasant and forgetful.  He tells me he is not sure what is going on with his life.   Physical Exam: BP 114/61 (BP  Location: Left Arm)   Pulse 74   Temp 98.1 F (36.7 C) (Axillary)   Resp 16   Ht 5' 6 (1.676 m)   Wt 54.6 kg   SpO2 92%   BMI 19.43 kg/m   General: Looks comfortable sitting in a chair. Cardiovascular: S1-S2 normal.   Respiratory: Bilateral clear.  Occasional dry cough. Gastrointestinal: Soft and nontender.  Bowel sounds present. Ext: No swelling or edema.  No cyanosis. Neuro: Alert and awake.  Oriented to himself.  Moves all extremities.      Data Reviewed: No new data.  Family Communication: None present    Disposition: Status is: Inpatient The patient was admitted for influenza, pneumonia, small bowel obstruction, and kidney stone  Patient is clinically stabilizing.  Will need inpatient therapies at a SNF or adequate support system at home to be in place before discharge.        Author: Renato Applebaum, MD 04/27/2023 11:22 AM  For on call review [email or web address].

## 2023-04-27 NOTE — Progress Notes (Signed)
 Speech Language Pathology Treatment: Dysphagia  Patient Details Name: Anthony Ball MRN: [id number] DOB: 1934-11-18 Today's Date: 04/27/2023 Time: 1448-1500 SLP Time Calculation (min) (ACUTE ONLY): 12 min  Assessment / Plan / Recommendation Clinical Impression  Entered room for swallow treatment and pt eating lunch. Goal was to determine if pt could advance textures however he continues to have prolonged mastication, attempts to take additional bites with mouth already full. He had coughs throughout meal that may have cleared (spontaneous throat clear removed penetrates during MBS) however unable to determine via clinical observation. He cannot remember strategies due to dementia and written strategies on wall in front of him do not appear to assist. When asked to swallow a second time he does with additional time needed. He does not appear ready to upgrade liquids as he had immediate cough with water on tray that was thickened but slightly thinner than nectar thick (reflexive cough after aspirated thin on MBS). Not appropriate for repeat MBS yet, suspect dysphagia is chronic and he may be discharged on nectar thick liquids however ST will continue to follow. Continue Dys 2 texture, nectar thick liquids.    HPI HPI: Anthony Ball is a 88 y.o. male with history of COPD, dementia, hypothyroidism who was recently evaluated by cardiologist for chest pain and had stress test on 04/02/2013 which was unremarkable with normal LV was brought to the ER after patient was complaining of shortness of breath and productive cough.  In the ER patient was hypoxic requiring 2 L oxygen with chest x-ray showing concerning features for infiltrates  Patient was started on empiric antibiotics for pneumonia. Found to have SBO resolved per MD noted 1/28. MD note says pt ddid cough some with drinking water.      SLP Plan  Continue with current plan of care      Recommendations for follow up therapy are one component of a  multi-disciplinary discharge planning process, led by the attending physician.  Recommendations may be updated based on patient status, additional functional criteria and insurance authorization.    Recommendations  Diet recommendations: Dysphagia 2 (fine chop);Nectar-thick liquid Liquids provided via: Straw;Cup Medication Administration: Crushed with puree Supervision: Patient able to self feed;Full supervision/cueing for compensatory strategies Compensations: Slow rate;Small sips/bites;Multiple dry swallows after each bite/sip;Clear throat intermittently Postural Changes and/or Swallow Maneuvers: Seated upright 90 degrees                  Oral care BID   Frequent or constant Supervision/Assistance Dysphagia, oropharyngeal phase (R13.12)     Continue with current plan of care     [name]  04/27/2023, 3:08 PM

## 2023-04-28 DIAGNOSIS — J189 Pneumonia, unspecified organism: Secondary | ICD-10-CM | POA: Diagnosis not present

## 2023-04-28 NOTE — Plan of Care (Signed)

## 2023-04-28 NOTE — Progress Notes (Signed)
 Mobility Specialist: Progress Note   04/28/23 1536  Mobility  Activity Ambulated with assistance in hallway  Level of Assistance Contact guard assist, steadying assist  Assistive Device Front wheel walker  Distance Ambulated (ft) 450 ft  Activity Response Tolerated well  Mobility Referral Yes  Mobility visit 1 Mobility  Mobility Specialist Start Time (ACUTE ONLY) 1410  Mobility Specialist Stop Time (ACUTE ONLY) 1426  Mobility Specialist Time Calculation (min) (ACUTE ONLY) 16 min    Received pt in bed having no complaints and agreeable to mobility. CG throughout. Pt was asymptomatic throughout ambulation and returned to room w/o fault. Left in bed w/ call bell in reach and all needs met.  Ileana Lute Mobility Specialist Please contact via SecureChat or Rehab office at [phone]

## 2023-04-28 NOTE — Progress Notes (Signed)
 PROGRESS NOTE    REVIS WHALIN  FMW:[id number]  DOB: Dec 08, 1934  DOA: 04/12/2023 PCP: Ransom Other, MD Outpatient Specialists:   Hospital course:  89 year old with dementia, COPD, hypothyroidism was admitted for influenza pneumonia,'s SBO and renal stone.  Patient has completed his course of therapy.  Is awaiting placement at private pay nursing home.  Subjective:  Patient working with PT when I saw him.  He was conversant and cooperative.   Objective: Vitals:   04/27/23 1700 04/27/23 2115 04/28/23 0518 04/28/23 0817  BP: 120/67 122/80 (!) 101/53 (!) 100/57  Pulse: 75 77 65 67  Resp: 20 18 18    Temp: 98.2 F (36.8 C) 98.4 F (36.9 C) 98.3 F (36.8 C) (!) 97.5 F (36.4 C)  TempSrc: Axillary Oral  Oral  SpO2:  92% 93% 92%  Weight:      Height:       No intake or output data in the 24 hours ending 04/28/23 1507 Filed Weights   04/12/23 1757 04/13/23 1725 04/24/23 0900  Weight: 61 kg 56.2 kg 54.6 kg     Exam:  General: Frail elderly man walking with PT Eyes: sclera anicteric, conjuctiva mild injection bilaterally CVS: S1-S2, regular  Respiratory:  decreased air entry bilaterally secondary to decreased inspiratory effort, rales at bases  GI: NABS, soft, NT  LE: Decreased muscle mass  Data Reviewed:  Basic Metabolic Panel: No results for input(s): NA, K, CL, CO2, GLUCOSE, BUN, CREATININE, CALCIUM, MG, PHOS in the last 168 hours.  CBC: No results for input(s): WBC, NEUTROABS, HGB, HCT, MCV, PLT in the last 168 hours.   Scheduled Meds:  dextromethorphan -guaiFENesin   1 tablet Oral BID   doxazosin   1 mg Oral Daily   levothyroxine   25 mcg Oral QAC breakfast   memantine   10 mg Oral Daily   multivitamin with minerals  1 tablet Oral Daily   pantoprazole   40 mg Oral Daily   QUEtiapine   25 mg Oral BID   Continuous Infusions:   Assessment & Plan:   No acute changes to baseline medical regimen.    Copied and pasted from  previous note: CAP (community acquired pneumonia) Completed treatment, resolved.  He will continue to need incentive spirometry, mucolytic's and bronchodilator therapy. On modified diet.  Dysphagia 2 diet with nectar thick liquids.  Speech therapy continues to follow-up.   Influenza A with pneumonia Symptoms resolved.  Continue to respiratory therapy as above.   Ureteral stone Urology were consulted, they recommend no intervention - Continue Flomax  -- Urology to schedule outpatient follow-up.   SBO (small bowel obstruction) (HCC) General Surgery were consulted, the patient had small bowel study, which showed contrast passing through to the colon.  He is now tolerating oral diet without difficulty and has had no pain or vomiting.   Dementia (HCC) -Delirium precautions -Continue memantine , Seroquel .  Stable.   Hypothyroidism -Continue levothyroxine    COPD (chronic obstructive pulmonary disease) (HCC) Respiratory status normal -Continue as needed nebulizers   Hypertension - Continue doxazosin    Lives at home.  Gradually improving.  SNF declined by the [employer]. Unable to go home. Family arranging for private pay nursing home.  Medically stable to discharge whenever bed available.       DVT prophylaxis: SCD Code Status: Full Family Communication: None today     Studies: No results found.  Principal Problem:   CAP (community acquired pneumonia) Active Problems:   SBO (small bowel obstruction) (HCC)   Ureteral stone   Acute respiratory failure with  hypoxia (HCC)   Influenza A with pneumonia   Oral phase dysphagia   COPD (chronic obstructive pulmonary disease) (HCC)   Hypothyroidism   Dementia (HCC)   GERD (gastroesophageal reflux disease)   Protein-calorie malnutrition, severe     Emira Eubanks Tublu Anhthu Perdew, [facility]  If 7PM-7AM, please contact night-coverage [email or web address]   LOS: 15 days

## 2023-04-29 DIAGNOSIS — J189 Pneumonia, unspecified organism: Secondary | ICD-10-CM | POA: Diagnosis not present

## 2023-04-29 NOTE — Plan of Care (Signed)

## 2023-04-29 NOTE — Progress Notes (Signed)
 PROGRESS NOTE    Anthony Ball  FMW:[id number]  DOB: 1934/09/17  DOA: 04/12/2023 PCP: Ransom Other, MD Outpatient Specialists:   Hospital course:  89 year old with dementia, COPD, hypothyroidism was admitted for influenza pneumonia,'s SBO and renal stone.  Patient has completed his course of therapy.  Is awaiting placement at private pay nursing home.  Subjective:  Patient is sleeping comfortably.  Arouses by voice alone.  Has no new complaints   Objective: Vitals:   04/27/23 2115 04/28/23 0518 04/28/23 0817 04/28/23 1800  BP: 122/80 (!) 101/53 (!) 100/57 128/65  Pulse: 77 65 67 90  Resp: 18 18  20   Temp: 98.4 F (36.9 C) 98.3 F (36.8 C) (!) 97.5 F (36.4 C) 98.2 F (36.8 C)  TempSrc: Oral  Oral Oral  SpO2: 92% 93% 92% 92%  Weight:      Height:       No intake or output data in the 24 hours ending 04/29/23 1534 Filed Weights   04/12/23 1757 04/13/23 1725 04/24/23 0900  Weight: 61 kg 56.2 kg 54.6 kg     Exam:  General: Frail elderly man sitting comfortably in bed in NAD Eyes: sclera anicteric, conjuctiva mild injection bilaterally CVS: S1-S2, regular  Respiratory:  decreased air entry bilaterally secondary to decreased inspiratory effort, rales at bases  GI: NABS, soft, NT  LE: Decreased muscle mass  Data Reviewed:  Basic Metabolic Panel: No results for input(s): NA, K, CL, CO2, GLUCOSE, BUN, CREATININE, CALCIUM, MG, PHOS in the last 168 hours.  CBC: No results for input(s): WBC, NEUTROABS, HGB, HCT, MCV, PLT in the last 168 hours.   Scheduled Meds:  dextromethorphan -guaiFENesin   1 tablet Oral BID   doxazosin   1 mg Oral Daily   levothyroxine   25 mcg Oral QAC breakfast   memantine   10 mg Oral Daily   multivitamin with minerals  1 tablet Oral Daily   pantoprazole   40 mg Oral Daily   QUEtiapine   25 mg Oral BID   Continuous Infusions:   Assessment & Plan:   Patient without any acute concerns or complaints No  acute changes to baseline medical regimen.    Copied and pasted from previous note: CAP (community acquired pneumonia) Completed treatment, resolved.  He will continue to need incentive spirometry, mucolytic's and bronchodilator therapy. On modified diet.  Dysphagia 2 diet with nectar thick liquids.  Speech therapy continues to follow-up.   Influenza A with pneumonia Symptoms resolved.  Continue to respiratory therapy as above.   Ureteral stone Urology were consulted, they recommend no intervention - Continue Flomax  -- Urology to schedule outpatient follow-up.   SBO (small bowel obstruction) (HCC) General Surgery were consulted, the patient had small bowel study, which showed contrast passing through to the colon.  He is now tolerating oral diet without difficulty and has had no pain or vomiting.   Dementia (HCC) -Delirium precautions -Continue memantine , Seroquel .  Stable.   Hypothyroidism -Continue levothyroxine    COPD (chronic obstructive pulmonary disease) (HCC) Respiratory status normal -Continue as needed nebulizers   Hypertension - Continue doxazosin    Lives at home.  Gradually improving.  SNF declined by the [employer]. Unable to go home. Family arranging for private pay nursing home.  Medically stable to discharge whenever bed available.       DVT prophylaxis: SCD Code Status: Full Family Communication: None today     Studies: No results found.  Principal Problem:   CAP (community acquired pneumonia) Active Problems:   SBO (small bowel obstruction) (HCC)  Ureteral stone   Acute respiratory failure with hypoxia (HCC)   Influenza A with pneumonia   Oral phase dysphagia   COPD (chronic obstructive pulmonary disease) (HCC)   Hypothyroidism   Dementia (HCC)   GERD (gastroesophageal reflux disease)   Protein-calorie malnutrition, severe     Sayvon Arterberry Tublu Carmie Lanpher, [facility]  If 7PM-7AM, please contact  night-coverage [email or web address]   LOS: 16 days

## 2023-04-30 DIAGNOSIS — J09X1 Influenza due to identified novel influenza A virus with pneumonia: Secondary | ICD-10-CM | POA: Diagnosis not present

## 2023-04-30 LAB — BASIC METABOLIC PANEL WITH GFR
Anion gap: 9 (ref 5–15)
BUN: 21 mg/dL (ref 8–23)
CO2: 26 mmol/L (ref 22–32)
Calcium: 8.8 mg/dL — ABNORMAL LOW (ref 8.9–10.3)
Chloride: 102 mmol/L (ref 98–111)
Creatinine, Ser: 0.82 mg/dL (ref 0.61–1.24)
GFR, Estimated: >60 mL/min
Glucose, Bld: 99 mg/dL (ref 70–99)
Potassium: 4.4 mmol/L (ref 3.5–5.1)
Sodium: 137 mmol/L (ref 135–145)

## 2023-04-30 LAB — CBC WITH DIFFERENTIAL/PLATELET
Abs Immature Granulocytes: 0.04 K/uL (ref 0.00–0.07)
Basophils Absolute: 0.1 K/uL (ref 0.0–0.1)
Basophils Relative: 2 %
Eosinophils Absolute: 0.5 K/uL (ref 0.0–0.5)
Eosinophils Relative: 9 %
HCT: 40.8 % (ref 39.0–52.0)
Hemoglobin: 13.1 g/dL (ref 13.0–17.0)
Immature Granulocytes: 1 %
Lymphocytes Relative: 24 %
Lymphs Abs: 1.4 K/uL (ref 0.7–4.0)
MCH: 28.7 pg (ref 26.0–34.0)
MCHC: 32.1 g/dL (ref 30.0–36.0)
MCV: 89.3 fL (ref 80.0–100.0)
Monocytes Absolute: 0.4 K/uL (ref 0.1–1.0)
Monocytes Relative: 7 %
Neutro Abs: 3.4 K/uL (ref 1.7–7.7)
Neutrophils Relative %: 57 %
Platelets: 449 K/uL — ABNORMAL HIGH (ref 150–400)
RBC: 4.57 MIL/uL (ref 4.22–5.81)
RDW: 14.2 % (ref 11.5–15.5)
WBC: 5.9 K/uL (ref 4.0–10.5)
nRBC: 0 % (ref 0.0–0.2)

## 2023-04-30 NOTE — TOC Progression Note (Signed)
 Transition of Care [facility]) - Progression Note    Patient Details  Name: Anthony Ball MRN: [id number] Date of Birth: 10-Jun-1934  [facility] CM/SW Contact  Brandilee Pies A [place], LCSW Phone Number: 04/30/2023, 10:46 AM  Clinical Narrative:     CSW reached out to [facility] [place] and left VM with admissions, [phone], regarding updated on pt's placement to facility. CSW left contact information to be reached back out to.   CSW then contacted pt's sister, Orelia Binet, to get update on finalization of paperwork. CSW left voicemail with contact information to reach back out to CSW.    TOC will continue to follow.   Expected Discharge Plan: Skilled Nursing Facility Barriers to Discharge: Insurance Authorization, SNF Pending bed offer, Continued Medical Work up  Expected Discharge Plan and Services In-house Referral: Clinical Social Work     Living arrangements for the past 2 months: Single Family Home Expected Discharge Date: 04/20/23                                     Social Determinants of Health (SDOH) Interventions SDOH Screenings   Food Insecurity: No Food Insecurity (04/13/2023)  Housing: Low Risk  (04/13/2023)  Transportation Needs: No Transportation Needs (04/13/2023)  Utilities: Not At Risk (04/13/2023)  Social Connections: Unknown (04/13/2023)  Tobacco Use: Unknown (04/13/2023)    Readmission Risk Interventions     No data to display

## 2023-04-30 NOTE — Progress Notes (Signed)
 Mobility Specialist Progress Note:    04/30/23 1603  Mobility  Activity Ambulated with assistance in hallway  Level of Assistance Contact guard assist, steadying assist  Assistive Device Front wheel walker  Distance Ambulated (ft) 200 ft  Activity Response Tolerated well  Mobility Referral Yes  Mobility visit 1 Mobility  Mobility Specialist Start Time (ACUTE ONLY) 1550  Mobility Specialist Stop Time (ACUTE ONLY) 1600  Mobility Specialist Time Calculation (min) (ACUTE ONLY) 10 min   Pt received in chair, confused but agreeable to mobility. Ambulated in hallway with RW and CGA. Tolerated well, asx throughout. Returned pt to room, sitting up in chair, alarm on, all needs met.   Shawanna Zanders Mobility Specialist Please contact via Special educational needs teacher or  Rehab office at [phone]

## 2023-04-30 NOTE — Plan of Care (Signed)

## 2023-04-30 NOTE — Progress Notes (Addendum)
 Progress Note   Patient: Anthony Ball [id number]:[id number] DOB: 1934-10-10 DOA: 04/12/2023     17 DOS: the patient was seen and examined on 04/30/2023   Brief hospital course: 88yo with h/o dementia, COPD, and hypothyroidism who was admitted on 1/23 with influenza A with PNA, completed treatment with Ceftriaxone /Azithromycin .  He was subsequently found to have SBO as well as R UPJ stone with mild hydronephrosis.   SBO has resolved and surgery signed off.     Assessment and Plan:  Placement issue Lived at home PTA Unable to return home SNF declined by insurance company Family is arranging for private pay at [facility] [place] He is medically stable when a bed is available  Oral phase dysphagia SLP consulting Please on dysphagia 2 (chopped) diet with nectar thick liquids Has ongoing cough  Severe malnutrition Nutrition Problem: Severe Malnutrition Etiology: chronic illness Signs/Symptoms: severe muscle depletion, severe fat depletion Interventions: Magic cup  Influenza with PNA Resolved Treated with 5 days of Ceftriaxone  and Azithromycin   SBO Resolved Surgery has signed off  R UPJ Stone Mild hydronephrosis on presentation, resolved on repeat CT Continue doxazosin  Urology consulted and recommends outpatient f/u  Dementia Delirium precautions Continue memantine , Seroquel   Hypothyroidism Continue Synthroid   COPD Continue Albuterol  Will add Mucinex     Consultants: Surgery Urology PT OT Nutrition SLP TOC team  Procedures: None  Antibiotics: Ceftriaxone  x 5 days Azithromycin  x 5 days  30 Day Unplanned Readmission Risk Score    Flowsheet Row ED to Hosp-Admission (Current) from 04/12/2023 in [place] [facility]  30 Day Unplanned Readmission Risk Score (%) 14.71 Filed at 04/30/2023 0401       This score is the patient's risk of an unplanned readmission within 30 days of being discharged (0 -100%). The score is based on dignosis, age, lab data,  medications, orders, and past utilization.   Low:  0-14.9   Medium: 15-21.9   High: 22-29.9   Extreme: 30 and above           Subjective: Pleasant, reports feeling ok.  Oriented to person only.   Objective: Vitals:   04/29/23 2007 04/30/23 1113  BP: 119/70 126/70  Pulse: 74 78  Resp: 17 18  Temp: 98.3 F (36.8 C)   SpO2:  96%   No intake or output data in the 24 hours ending 04/30/23 1526 Filed Weights   04/12/23 1757 04/13/23 1725 04/24/23 0900  Weight: 61 kg 56.2 kg 54.6 kg    Exam:  General:  Appears calm and comfortable and is in NAD, sitting up in bedside chair Eyes:  EOMI, normal lids, iris ENT:   hard of hearing, grossly normal lips & tongue, mmm Neck:  no LAD, masses or thyromegaly Cardiovascular:  RRR, no m/r/g. No LE edema.  Respiratory:   CTA bilaterally with no wheezes/rales/rhonchi.  Normal respiratory effort. Abdomen:  soft, NT, ND Skin:  no rash or induration seen on limited exam Musculoskeletal:  grossly normal tone BUE/BLE, good ROM, no bony abnormality Psychiatric:  blunted mood and affect, speech appropriate, AOx1 Neurologic:  CN 2-12 grossly intact, moves all extremities in coordinated fashion  Data Reviewed: I have reviewed the patient's lab results since admission.  Pertinent labs for today include:   Unremarkable BMP Stable CBC CRP 6.6     Family Communication: Sister and a friend were present throughout evaluation  Disposition: Status is: Inpatient Remains inpatient appropriate because: needs placement     Time spent: 35 minutes  Unresulted Labs (  From admission, onward)    None        Author: Lorita Rosa, MD 04/30/2023 3:26 PM  For on call review [email or web address].

## 2023-04-30 NOTE — Progress Notes (Signed)
 Physical Therapy Treatment Patient Details Name: Anthony Ball MRN: [id number] DOB: 02/16/1935 Today's Date: 04/30/2023   History of Present Illness Anthony Ball is a 89 y.o. male  patient to hospital with shortness of breath and productive cough; influenza a with pneumonia, partial SBO with history of COPD, dementia, hypothyroidism    PT Comments  Pt received in supine and agreeable to session. Pt able to tolerate increased gait distance this session with minimal fatigue. Pt demonstrates improved RW management during turns and obstacle negotiation this session, however continues to require intermittent cues for RW proximity and upright posture. Pt continues to benefit from PT services to progress toward functional mobility goals.    If plan is discharge home, recommend the following: A little help with walking and/or transfers;A lot of help with bathing/dressing/bathroom;Direct supervision/assist for financial management;Assistance with cooking/housework;Assist for transportation;Supervision due to cognitive status;Help with stairs or ramp for entrance   Can travel by private vehicle        Equipment Recommendations  Rolling walker (2 wheels);BSC/3in1    Recommendations for Other Services       Precautions / Restrictions Precautions Precautions: Fall Restrictions Weight Bearing Restrictions Per Provider Order: No     Mobility  Bed Mobility Overal bed mobility: Modified Independent Bed Mobility: Supine to Sit     Supine to sit: Modified independent (Device/Increase time)     General bed mobility comments: increased time    Transfers Overall transfer level: Needs assistance Equipment used: Rolling walker (2 wheels) Transfers: Sit to/from Stand Sit to Stand: Supervision           General transfer comment: From low EOB with supervision for safety    Ambulation/Gait Ambulation/Gait assistance: Contact guard assist, Supervision Gait Distance (Feet): 250  Feet Assistive device: Rolling walker (2 wheels) Gait Pattern/deviations: Step-through pattern, Trunk flexed, Decreased stride length, Drifts right/left       General Gait Details: slow gait with low foot clearance. Improved RW proximity during turns and obstacle negotiation this session, but still requires intermittent cues       Balance Overall balance assessment: Needs assistance Sitting-balance support: Feet supported Sitting balance-Anthony Ball Scale: Good Sitting balance - Comments: sitting EOB   Standing balance support: Bilateral upper extremity supported, During functional activity Standing balance-Anthony Ball Scale: Fair Standing balance comment: with RW support                            Cognition Arousal: Alert Behavior During Therapy: Flat affect Overall Cognitive Status: History of cognitive impairments - at baseline                                          Exercises      General Comments        Pertinent Vitals/Pain Pain Assessment Pain Assessment: No/denies pain     PT Goals (current goals can now be found in the care plan section) Acute Rehab PT Goals Patient Stated Goal: Agraable to getting back to bed PT Goal Formulation: Patient unable to participate in goal setting Time For Goal Achievement: 04/29/23 Progress towards PT goals: Progressing toward goals    Frequency    Min 1X/week       AM-PAC PT "6 Clicks" Mobility   Outcome Measure  Help needed turning from your back to your side while in a flat bed without using  bedrails?: None Help needed moving from lying on your back to sitting on the side of a flat bed without using bedrails?: None Help needed moving to and from a bed to a chair (including a wheelchair)?: A Little Help needed standing up from a chair using your arms (e.g., wheelchair or bedside chair)?: A Little Help needed to walk in hospital room?: A Little Help needed climbing 3-5 steps with a railing? : A  Little 6 Click Score: 20    End of Session Equipment Utilized During Treatment: Gait belt Activity Tolerance: Patient tolerated treatment well Patient left: in chair;with chair alarm set;with call bell/phone within reach;with family/visitor present Nurse Communication: Mobility status PT Visit Diagnosis: Unsteadiness on feet (R26.81);Other abnormalities of gait and mobility (R26.89);Muscle weakness (generalized) (M62.81)     Time: 1610-9604 PT Time Calculation (min) (ACUTE ONLY): 17 min  Charges:    $Gait Training: 8-22 mins PT General Charges $$ ACUTE PT VISIT: 1 Visit                     Anthony Ball, PTA Acute Rehabilitation Services Secure Chat Preferred  [phone]    Anthony Ball 04/30/2023, 12:26 PM

## 2023-05-01 DIAGNOSIS — J09X1 Influenza due to identified novel influenza A virus with pneumonia: Secondary | ICD-10-CM | POA: Diagnosis not present

## 2023-05-01 NOTE — Plan of Care (Signed)

## 2023-05-01 NOTE — TOC Progression Note (Addendum)
Transition of Care [facility]) - Progression Note    Patient Details  Name: Anthony Ball MRN: [id number] Date of Birth: 06-14-1934  Transition of Care [facility]) CM/SW Contact  Erin Sons, [place] Phone Number: 05/01/2023, 11:10 AM  Clinical Narrative:     CSW called pt's sister, Anthony Ball, for update regarding admissions paperwork for [facility] [place]. She states she does not have any paperwork and is not sure what exactly needs to be done. CSW to follow up with [facility] [place] to clarify. [name] confirmed she would be able to go to the facility to complete paperwork if needed.   CSW called admissions at [facility] [place] multiple times though voicemail box is full. CSW spoke with Diplomatic Services operational officer who informed CSW to try to email their admissions at [email or web address]. CSW emailed requesting clarification on paperwork that is needed.   1420: No email response at this time.  CSW called [facility] admissions again; no answer and voicemail box is full.   1530: Called admissions again; no answer and no voicemail. CSW did receive a text stating "Sorry, I can't talk right now." CSW responded by text expressing need to discuss referral. Have not received a response.   Expected Discharge Plan: Skilled Nursing Facility Barriers to Discharge: SNF Pending bed offer  Expected Discharge Plan and Services In-house Referral: Clinical Social Work     Living arrangements for the past 2 months: Single Family Home Expected Discharge Date: 04/20/23                                     Social Determinants of Health (SDOH) Interventions SDOH Screenings   Food Insecurity: No Food Insecurity (04/13/2023)  Housing: Low Risk  (04/13/2023)  Transportation Needs: No Transportation Needs (04/13/2023)  Utilities: Not At Risk (04/13/2023)  Social Connections: Unknown (04/13/2023)  Tobacco Use: Unknown (04/13/2023)    Readmission Risk Interventions     No data to display

## 2023-05-01 NOTE — Progress Notes (Signed)
Mobility Specialist: Progress Note   05/01/23 1607  Mobility  Activity Ambulated with assistance in hallway  Level of Assistance Contact guard assist, steadying assist  Assistive Device Front wheel walker  Distance Ambulated (ft) 350 ft  Activity Response Tolerated well  Mobility Referral Yes  Mobility visit 1 Mobility  Mobility Specialist Start Time (ACUTE ONLY) 1450  Mobility Specialist Stop Time (ACUTE ONLY) 1513  Mobility Specialist Time Calculation (min) (ACUTE ONLY) 23 min    Pt was confused but ultimately agreeable to mobility session - received in bed. SV for bed mobility and STS, CG for ambulation. No complaints. Returned to room without fault. Left in bed with all needs met, call bell in reach.   Maurene Capes Mobility Specialist Please contact via SecureChat or Rehab office at [phone]

## 2023-05-01 NOTE — Progress Notes (Signed)
Progress Note   Patient: Anthony Ball [id number]:[id number] DOB: [birth year] DOA: 04/12/2023     18 DOS: the patient was seen and examined on 05/01/2023   Brief hospital course: 89yo with h/o dementia, COPD, and hypothyroidism who was admitted on 1/23 with influenza A with PNA, completed treatment with Ceftriaxone/Azithromycin.  He was subsequently found to have SBO as well as R UPJ stone with mild hydronephrosis.   SBO has resolved and surgery signed off.     Assessment and Plan:  Placement issue Lived at home PTA Unable to return home SNF declined by insurance company Family is arranging for private pay at [facility] [place] He is medically stable when a bed is available   Oral phase dysphagia SLP consulting Please on dysphagia 2 (chopped) diet with nectar thick liquids Has ongoing cough   Severe malnutrition Nutrition Problem: Severe Malnutrition Etiology: chronic illness Signs/Symptoms: severe muscle depletion, severe fat depletion Interventions: Magic cup   Influenza with PNA Resolved Treated with 5 days of Ceftriaxone and Azithromycin   SBO Resolved Surgery has signed off   R UPJ Stone Mild hydronephrosis on presentation, resolved on repeat CT Continue doxazosin Urology consulted and recommends outpatient f/u   Dementia Delirium precautions Continue memantine, Seroquel   Hypothyroidism Continue Synthroid   COPD Continue Albuterol Will add Mucinex       Consultants: Surgery Urology PT OT Nutrition SLP TOC team   Procedures: None   Antibiotics: Ceftriaxone x 5 days Azithromycin x 5 days   30 Day Unplanned Readmission Risk Score    Flowsheet Row ED to Hosp-Admission (Current) from 04/12/2023 in [place] [facility] Unit  30 Day Unplanned Readmission Risk Score (%) 17.25 Filed at 05/01/2023 1200       This score is the patient's risk of an unplanned readmission within 30 days of being discharged (0 -100%). The score is based on dignosis,  age, lab data, medications, orders, and past utilization.   Low:  0-14.9   Medium: 15-21.9   High: 22-29.9   Extreme: 30 and above           Subjective: No specific complaints, minimally conversant, wants to go home.   Objective: Vitals:   04/30/23 2056 05/01/23 0759  BP: 119/67 122/63  Pulse: 73 62  Resp: 18 17  Temp: 97.6 F (36.4 C)   SpO2: 97% 94%    Intake/Output Summary (Last 24 hours) at 05/01/2023 1515 Last data filed at 05/01/2023 0552 Gross per 24 hour  Intake --  Output 300 ml  Net -300 ml   Filed Weights   04/12/23 1757 04/13/23 1725 04/24/23 0900  Weight: 61 kg 56.2 kg 54.6 kg    Exam:  General:  Appears calm and comfortable and is in NAD, sitting up in bedside chair with posey belt Eyes:  EOMI, normal lids, iris ENT:   hard of hearing, grossly normal lips & tongue, mmm Neck:  no LAD, masses or thyromegaly Cardiovascular:  RRR, no m/r/g. No LE edema.  Respiratory:   CTA bilaterally with no wheezes/rales/rhonchi.  Normal respiratory effort. Abdomen:  soft, NT, ND Skin:  no rash or induration seen on limited exam Musculoskeletal:  grossly normal tone BUE/BLE, good ROM, no bony abnormality Psychiatric:  blunted mood and affect, speech appropriate, AOx1 Neurologic:  CN 2-12 grossly intact, moves all extremities in coordinated fashion  Data Reviewed: I have reviewed the patient's lab results since admission.  Pertinent labs for today include:   None today  Family Communication: None present  Disposition: Status is: Inpatient Remains inpatient appropriate because: needs placement     Time spent: 25 minutes  Unresulted Labs (From admission, onward)    None        Author: Jonah Blue, MD 05/01/2023 3:15 PM  For on call review [email or web address].

## 2023-05-01 NOTE — Plan of Care (Signed)
  Problem: Clinical Measurements: Goal: Will remain free from infection Outcome: Progressing Goal: Respiratory complications will improve Outcome: Progressing   Problem: Nutrition: Goal: Adequate nutrition will be maintained Outcome: Progressing   Problem: Coping: Goal: Level of anxiety will decrease Outcome: Progressing   Problem: Safety: Goal: Ability to remain free from injury will improve Outcome: Progressing   Problem: Activity: Goal: Ability to tolerate increased activity will improve Outcome: Progressing   Problem: Respiratory: Goal: Ability to maintain a clear airway will improve Outcome: Progressing

## 2023-05-01 NOTE — Progress Notes (Signed)
     Subjective: Anthony Ball. Pt was extremely confused today and had difficulty answering basic questions.   Objective: Vital signs in last 24 hours: Temp:  [97.6 F (36.4 C)] 97.6 F (36.4 C) (02/10 2056) Pulse Rate:  [62-73] 62 (02/11 0759) Resp:  [17-18] 17 (02/11 0759) BP: (119-122)/(63-67) 122/63 (02/11 0759) SpO2:  [94 %-97 %] 94 % (02/11 0759)  Assessment/Plan: # Right UPJ stone. Renal function remains preserved. KUB collected did not visualize stone.  Retained contrast lower yield of imaging.  Can consider CT on an outpatient basis Patient to follow-up in office in 2-3 weeks. Metal capacity has deteriorated remarkably. Touched base with Lonn Georgia, his sister and POA. She was provided with our office information and will see to it that he follows up on an outpt basis.  Urology will sign off at this time. Please call with questions.   Intake/Output from previous day: 02/10 0701 - 02/11 0700 In: -  Out: 300 [Urine:300]  Intake/Output this shift: No intake/output data recorded.  Physical Exam:  General: Alert and oriented CV: No cyanosis Lungs: equal chest rise   Lab Results: Recent Labs    04/30/23 0843  HGB 13.1  HCT 40.8   BMET Recent Labs    04/30/23 0843  NA 137  K 4.4  CL 102  CO2 26  GLUCOSE 99  BUN 21  CREATININE 0.82  CALCIUM 8.8*     Studies/Results: No results found.     LOS: 18 days   Elmon Kirschner, NP [facility] Pager: [phone]  05/01/2023, 2:17 PM

## 2023-05-01 NOTE — Progress Notes (Addendum)
 Occupational Therapy Treatment Patient Details Name: Anthony Ball MRN: [id number] DOB: 07-18-1934 Today's Date: 05/01/2023   History of present illness Anthony Ball is a 89 y.o. male  patient to hospital with shortness of breath and productive cough; influenza a with pneumonia, partial SBO with history of COPD, dementia, hypothyroidism   OT comments  Pt progressing toward goals this session, needing supervision -CGA for ADLs, pt able to stand at sink x20 min for bathing /grooming tasks, CGA for mobility without AD, challenges standing balance by doffing undergarments in standing without LOB. Pt still with decr cognition, will need assist with meds mgmt/safety if pt returns home. Pt presenting with impairments listed below, will follow acutely. Patient will benefit from continued inpatient follow up therapy, <3 hours/day, however if pt returns home, will benefit from [facility] services and frequent supervision.       If plan is discharge home, recommend the following:  A little help with walking and/or transfers;A little help with bathing/dressing/bathroom;Assistance with cooking/housework;Assistance with feeding;Direct supervision/assist for medications management;Direct supervision/assist for financial management;Assist for transportation;Help with stairs or ramp for entrance;Supervision due to cognitive status   Equipment Recommendations  None recommended by OT    Recommendations for Other Services PT consult    Precautions / Restrictions Precautions Precautions: Fall Restrictions Other Position/Activity Restrictions: watch O2 sats on room air       Mobility Bed Mobility Overal bed mobility: Modified Independent                  Transfers Overall transfer level: Needs assistance Equipment used: 1 person hand held assist Transfers: Sit to/from Stand Sit to Stand: Contact guard assist           General transfer comment: pt standing x20 min at sink for ADLs      Balance Overall balance assessment: Needs assistance Sitting-balance support: Feet supported Sitting balance-Leahy Scale: Good Sitting balance - Comments: sitting EOB   Standing balance support: Bilateral upper extremity supported, During functional activity Standing balance-Leahy Scale: Fair                             ADL either performed or assessed with clinical judgement   ADL Overall ADL's : Needs assistance/impaired     Grooming: Wash/dry face;Wash/dry hands;Standing Grooming Details (indicate cue type and reason): standing at sink Upper Body Bathing: Contact guard assist;Standing   Lower Body Bathing: Contact guard assist;Sit to/from stand   Upper Body Dressing : Contact guard assist;Standing   Lower Body Dressing: Contact guard assist;Sit to/from stand   Toilet Transfer: Contact guard assist   Toileting- Clothing Manipulation and Hygiene: Supervision/safety Toileting - Clothing Manipulation Details (indicate cue type and reason): seated pericare     Functional mobility during ADLs: Contact guard assist      Extremity/Trunk Assessment Upper Extremity Assessment Upper Extremity Assessment: Generalized weakness   Lower Extremity Assessment Lower Extremity Assessment: Defer to PT evaluation        Vision   Vision Assessment?: No apparent visual deficits   Perception Perception Perception: Not tested   Praxis Praxis Praxis: Not tested   Communication Communication Communication: Impaired Factors Affecting Communication: Hearing impaired   Cognition Arousal: Alert Behavior During Therapy: Flat affect Cognition: Cognition impaired   Orientation impairments: Situation (states he is at the hospital and it is "Monday or Tuesday" (correct day being Tuesday)) Awareness: Intellectual awareness intact, Online awareness impaired Memory impairment (select all impairments): Short-term memory, Working [employer] fast streamer,  Declarative long-term memory Attention  impairment (select first level of impairment): Sustained attention   OT - Cognition Comments: pt with 2/3 word recall after a few mins, able to perform simple math task and provides appropriate responses to safety situations "i.e. what to do if you have a fire/smell smoke" pt tangential in conversation and very detailed with responses                 Following commands: Intact        Cueing   Cueing Techniques: Verbal cues  Exercises      Shoulder Instructions       General Comments VSS    Pertinent Vitals/ Pain       Pain Assessment Pain Assessment: No/denies pain  Home Living                                          Prior Functioning/Environment              Frequency  Min 1X/week        Progress Toward Goals  OT Goals(current goals can now be found in the care plan section)  Progress towards OT goals: Progressing toward goals  Acute Rehab OT Goals Patient Stated Goal: none stated OT Goal Formulation: With patient Time For Goal Achievement: 05/10/23 Potential to Achieve Goals: Good ADL Goals Pt Will Perform Upper Body Dressing: with modified independence;sitting Pt Will Perform Lower Body Dressing: with modified independence;sit to/from stand Pt Will Transfer to Toilet: with supervision;ambulating Additional ADL Goal #1: pt will follow 2 step command with min cues in prep for ADLs  Plan      Co-evaluation                 AM-PAC OT "6 Clicks" Daily Activity     Outcome Measure   Help from another person eating meals?: A Little Help from another person taking care of personal grooming?: A Little Help from another person toileting, which includes using toliet, bedpan, or urinal?: A Little Help from another person bathing (including washing, rinsing, drying)?: A Little Help from another person to put on and taking off regular upper body clothing?: A Little Help from another person to put on and taking off regular lower  body clothing?: A Little 6 Click Score: 18    End of Session Equipment Utilized During Treatment: Gait belt  OT Visit Diagnosis: Unsteadiness on feet (R26.81);Other abnormalities of gait and mobility (R26.89);Muscle weakness (generalized) (M62.81)   Activity Tolerance Patient tolerated treatment well   Patient Left in chair;with call bell/phone within reach;with chair alarm set   Nurse Communication Mobility status        Time: 1610-9604 OT Time Calculation (min): 28 min  Charges: OT General Charges $OT Visit: 1 Visit OT Treatments $Self Care/Home Management : 23-37 mins  Carver Fila, OTD, OTR/L SecureChat Preferred Acute Rehab [phone]   Carver Fila Koonce 05/01/2023, 9:38 AM

## 2023-05-02 DIAGNOSIS — N200 Calculus of kidney: Secondary | ICD-10-CM | POA: Insufficient documentation

## 2023-05-02 DIAGNOSIS — J09X1 Influenza due to identified novel influenza A virus with pneumonia: Secondary | ICD-10-CM | POA: Diagnosis not present

## 2023-05-02 MED ORDER — GUAIFENESIN 100 MG/5ML PO LIQD: 5.0000 mL | ORAL | Status: DC | PRN

## 2023-05-02 NOTE — TOC Progression Note (Addendum)
Transition of Care [facility]) - Progression Note    Patient Details  Name: Anthony Ball MRN: [id number] Date of Birth: March 07, 1935  Transition of Care [facility]) CM/SW Contact  Erin Sons, [place] Phone Number: 05/02/2023, 9:31 AM  Clinical Narrative:     CSW called [facility] admissions; no answer but able to leave a message this time. Left voicemail requesting return call.   1230: Called admissions again, no answer. CSW sent text message requesting return call.   Expected Discharge Plan: Skilled Nursing Facility Barriers to Discharge: SNF Pending bed offer  Expected Discharge Plan and Services In-house Referral: Clinical Social Work     Living arrangements for the past 2 months: Single Family Home Expected Discharge Date: 04/20/23                                     Social Determinants of Health (SDOH) Interventions SDOH Screenings   Food Insecurity: No Food Insecurity (04/13/2023)  Housing: Low Risk  (04/13/2023)  Transportation Needs: No Transportation Needs (04/13/2023)  Utilities: Not At Risk (04/13/2023)  Social Connections: Unknown (04/13/2023)  Tobacco Use: Unknown (04/13/2023)    Readmission Risk Interventions     No data to display

## 2023-05-02 NOTE — Progress Notes (Signed)
Speech Language Pathology Treatment: Dysphagia  Patient Details Name: Anthony Ball MRN: [id number] DOB: [birth year] Today's Date: 05/02/2023 Time: 7829-5621 SLP Time Calculation (min) (ACUTE ONLY): 12 min  Assessment / Plan / Recommendation Clinical Impression  Pt seen eating breakfast and also observed with upgraded trial of regular and thin liquids (premature spill into airway, aspirated with immediate cough on MBS). Pt self feeding but attempting to put multiple bites in oral cavity and needed cueing to clear majority of po's prior to subsequent bites. He did masticate regular graham cracker in more timely manner than previous sessions however given his impulsivity and cognitive impairments due to dementia recommend he continue with Dys 2 texture.  He consumed thin with slight pause in between sips throughout session consistently without s/s aspiration. Overall there were only 2-3 throat clears during session (not after a particular consistency) throughout session which was decreased from prior observations. He would benefit from repeat MBS prior to SNF to determine possible liquid upgrade and may attempt this tomorrow. Continue Dys 2/nectar for now.    HPI HPI: Anthony Ball is a 89 y.o. male with history of COPD, dementia, hypothyroidism who was recently evaluated by cardiologist for chest pain and had stress test on 04/02/2013 which was unremarkable with normal LV was brought to the ER after patient was complaining of shortness of breath and productive cough.  In the ER patient was hypoxic requiring 2 L oxygen with chest x-ray showing concerning features for infiltrates  Patient was started on empiric antibiotics for pneumonia. Found to have SBO resolved per MD noted 1/28. MD note says pt "ddid cough some with drinking water."      SLP Plan  Continue with current plan of care      Recommendations for follow up therapy are one component of a multi-disciplinary discharge planning process, led  by the attending physician.  Recommendations may be updated based on patient status, additional functional criteria and insurance authorization.    Recommendations  Liquids provided via: Cup;Straw Medication Administration: Crushed with puree Supervision: Patient able to self feed;Full supervision/cueing for compensatory strategies Compensations: Slow rate;Small sips/bites Postural Changes and/or Swallow Maneuvers: Seated upright 90 degrees                  Oral care BID   Frequent or constant Supervision/Assistance Dysphagia, oropharyngeal phase (R13.12)     Continue with current plan of care     Anthony Ball  05/02/2023, 10:24 AM

## 2023-05-02 NOTE — Progress Notes (Deleted)
 Name: Anthony Ball DOB: 12/26/1934 MRN: [id number]  History of Present Illness: Mr. Baggerly is a 89 y.o. male who presents today as a new patient at [facility]. All available relevant medical records have been reviewed. He resides at [facility] and is accompanied by ***, who assists with providing history due to patient's dementia.  He reports concern of kidney stone(s).  He {Actions; denies-reports:120008} prior history of kidney stones. He {Actions; denies-reports:120008} prior history of kidney stone procedure(s) ***including ***ESWL ***ureteroscopic stone manipulation ***PCNL.  Recent history: Admitted 04/12/2023 - 04/20/2023 for community acquired pneumonia. There was initially some concern for possible small bowel obstruction which resolved. During admission. > CT chest/abdomen/pelvis w/o contrast on 04/13/2023 showed: - Bilateral punctate intrarenal stones. - A 5 mm x 2 mm right UPJ stone with mild right hydronephrosis.  - Bladder unremarkable. - Moderate to severe prostatomegaly, transverse axis 6 cm. > Discharged on Flomax (Tamsulosin) 0.4 mg daily for medical expulsive therapy.  > 04/20/2023: Stones not visible on KUB.   > 04/30/2023: Normal renal function (GFR >60; creatinine 0.82).   Today: He {Actions; denies-reports:120008} passing the stone. He {Actions; denies-reports:120008} flank pain or abdominal pain. He {Actions; denies-reports:120008} fevers, nausea, or vomiting.  He {Actions; denies-reports:120008} increased urinary urgency, frequency, nocturia, dysuria, gross hematuria, hesitancy, straining to void, or sensations of incomplete emptying.  ***repeat CT to reassess stones & hydronephrosis   Fall Screening: Do you usually have a device to assist in your mobility? {yes/no:20286} ***cane / ***walker / ***wheelchair  Medications: No current facility-administered medications for this visit.   Current Outpatient Medications   Medication Sig Dispense Refill   acetaminophen (TYLENOL) 325 MG tablet Take 2 tablets (650 mg total) by mouth every 6 (six) hours as needed for mild pain (pain score 1-3) (or Fever >/= 101).     ipratropium-albuterol (DUONEB) 0.5-2.5 (3) MG/3ML SOLN Take 3 mLs by nebulization in the morning, at noon, in the evening, and at bedtime.     ondansetron (ZOFRAN) 4 MG tablet Take 1 tablet (4 mg total) by mouth every 6 (six) hours as needed for nausea.     pantoprazole (PROTONIX) 40 MG tablet Take 1 tablet (40 mg total) by mouth daily.     QUEtiapine (SEROQUEL) 25 MG tablet Take 1 tablet (25 mg total) by mouth 2 (two) times daily.     tamsulosin (FLOMAX) 0.4 MG CAPS capsule Take 1 capsule (0.4 mg total) by mouth daily.     Facility-Administered Medications Ordered in Other Visits  Medication Dose Route Frequency Provider Last Rate Last Admin   acetaminophen (TYLENOL) tablet 650 mg  650 mg Oral Q6H PRN Eduard Clos, MD       Or   acetaminophen (TYLENOL) suppository 650 mg  650 mg Rectal Q6H PRN Eduard Clos, MD       doxazosin (CARDURA) tablet 1 mg  1 mg Oral Daily McKenzie, Mardene Celeste, MD   1 mg at 05/02/23 1000   guaiFENesin (ROBITUSSIN) 100 MG/5ML liquid 5 mL  5 mL Oral Q4H PRN Jonah Blue, MD       ipratropium-albuterol (DUONEB) 0.5-2.5 (3) MG/3ML nebulizer solution 3 mL  3 mL Nebulization Q4H PRN Carollee Herter, DO   3 mL at 04/19/23 1123   levothyroxine (SYNTHROID) tablet 25 mcg  25 mcg Oral QAC breakfast Pokhrel, Laxman, MD   25 mcg at 05/02/23 0550   memantine (NAMENDA) tablet 10 mg  10 mg Oral Daily Pokhrel, Rebekah Chesterfield, MD  10 mg at 05/02/23 1000   multivitamin with minerals tablet 1 tablet  1 tablet Oral Daily Dorcas Carrow, MD   1 tablet at 05/02/23 1000   ondansetron (ZOFRAN) tablet 4 mg  4 mg Oral Q6H PRN Eduard Clos, MD       Or   ondansetron [facility]) injection 4 mg  4 mg Intravenous Q6H PRN Eduard Clos, MD       pantoprazole (PROTONIX) EC tablet 40 mg  40  mg Oral Daily Carollee Herter, DO   40 mg at 05/02/23 1000   QUEtiapine (SEROQUEL) tablet 25 mg  25 mg Oral BID Pokhrel, Laxman, MD   25 mg at 05/02/23 1000    Allergies: No Known Allergies  Past Medical History:  Diagnosis Date   COPD (chronic obstructive pulmonary disease) (HCC)    Emphysema    GERD (gastroesophageal reflux disease)    Seasonal allergies    Past Surgical History:  Procedure Laterality Date   CARDIAC CATHETERIZATION     PROSTATE SURGERY     Family History  Family history unknown: Yes   Social History   Socioeconomic History   Marital status: Divorced    Spouse name: Not on file   Number of children: Not on file   Years of education: Not on file   Highest education level: Not on file  Occupational History   Not on file  Tobacco Use   Smoking status: Never   Smokeless tobacco: Not on file  Substance and Sexual Activity   Alcohol use: No   Drug use: No   Sexual activity: Not on file  Other Topics Concern   Not on file  Social History Narrative   Not on file   Social Drivers of Health   Financial Resource Strain: Not on file  Food Insecurity: No Food Insecurity (04/13/2023)   Hunger Vital Sign    Worried About Running Out of Food in the Last Year: Never true    Ran Out of Food in the Last Year: Never true  Transportation Needs: No Transportation Needs (04/13/2023)   PRAPARE - Administrator, Civil Service (Medical): No    Lack of Transportation (Non-Medical): No  Physical Activity: Not on file  Stress: Not on file  Social Connections: Unknown (04/13/2023)   Social Connection and Isolation Panel [NHANES]    Frequency of Communication with Friends and Family: Twice a week    Frequency of Social Gatherings with Friends and Family: Twice a week    Attends Religious Services: More than 4 times per year    Active Member of [employer] or Organizations: No    Attends Banker Meetings: Never    Marital Status: Patient unable to answer   Intimate Partner Violence: Not At Risk (04/13/2023)   Humiliation, Afraid, Rape, and Kick questionnaire    Fear of Current or Ex-Partner: No    Emotionally Abused: No    Physically Abused: No    Sexually Abused: No    SUBJECTIVE  Review of Systems Constitutional: Patient denies any unintentional weight loss or change in strength lntegumentary: Patient denies any rashes or pruritus Cardiovascular: Patient denies chest pain or syncope Respiratory: Patient denies shortness of breath Gastrointestinal: Patient ***denies nausea, vomiting, constipation, or diarrhea Musculoskeletal: Patient denies muscle cramps or weakness Neurologic: Patient denies convulsions or seizures Allergic/Immunologic: Patient denies recent allergic reaction(s) Hematologic/Lymphatic: Patient denies bleeding tendencies Endocrine: Patient denies heat/cold intolerance  GU: As per HPI.  OBJECTIVE There were no vitals  filed for this visit. There is no height or weight on file to calculate BMI.  Physical Examination Constitutional: No obvious distress; patient is non-toxic appearing  Cardiovascular: No visible lower extremity edema.  Respiratory: The patient does not have audible wheezing/stridor; respirations do not appear labored  Gastrointestinal: Abdomen non-distended Musculoskeletal: Normal ROM of UEs  Skin: No obvious rashes/open sores  Neurologic: CN 2-12 grossly intact Psychiatric: Answered questions appropriately with normal affect  Hematologic/Lymphatic/Immunologic: No obvious bruises or sites of spontaneous bleeding  UA:  ***positive for *** leukocytes, *** blood, ***nitrites ***Urine microscopy:  ***negative  *** WBC/hpf, *** RBC/hpf, *** bacteria ***with no evidence of UTI ***with no evidence of microscopic hematuria ***otherwise unremarkable ***glucosuria (secondary to ***Jardiance ***Farxiga use)  PVR: *** ml  ASSESSMENT No diagnosis found.  ***We reviewed recent imaging results;  ***awaiting radiology results, appears to have ***no acute findings per provider interpretation.  ***Advised adequate hydration and we discussed option to consider low oxalate diet given that calcium oxalate is the most common type of stone. Handout provided about stone prevention diet.  For acute GU stone symptoms we agreed to proceed with: - ***RUS and KUB / ***CT stone study to evaluate current stone burden.  - ***CMP to assess kidney function. - ***Flomax 0.4 mg daily for medical expulsive therapy (MET), which may improve passage of stone(s).  - For pain management, we discussed the use of opioids versus OTC analgesics. A 5 day prescription was sent for ***Percocet for PRN use for severe pain.  - For nausea / vomiting, a prescription was sent for ***Zofran / ***Phenergan for PRN use.  ***We discussed the various treatment options including ***medical expulsive therapy (MET), ***extracorporeal shock wave lithotripsy (ESWL), ureteroscopic stone manipulation (URS), or ***percutaneous nephrolithotomy (PCNL). We discussed possible risks and benefits of intervention including but not limited to: including pain, infection, sepsis, UTI, ureter perforation, need for stenting, post-op ureteral stricture, hematuria.  ***Will consult with ***Dr. Ronne Binning and notify patient of his recommendations ***for next steps / ***following review of imaging results.  Will plan to follow up in ***2 weeks ***6 months with ***KUB ***RUS for stone surveillance or sooner if needed.   He was advised to contact urology provider or go to the ER if He develops fever >101F, uncontrollable pain, or other significantly concerning symptoms prior to next office visit.  He verbalized understanding and agreement. All questions were answered.   PLAN Advised the following: ***Flomax daily x2 weeks. ***Analgesics PRN for pain. ***Zofran PRN for nausea. ***No follow-ups on file.  No orders of the defined types were placed in  this encounter.   It has been explained that the patient is to follow regularly with their PCP in addition to all other providers involved in their care and to follow instructions provided by these respective offices. Patient advised to contact urology clinic if any urologic-pertaining questions, concerns, new symptoms or problems arise in the interim period.  There are no Patient Instructions on file for this visit.  Electronically signed by: Donnita Falls, MSN, FNP-C, CUNP 05/02/2023 2:51 PM

## 2023-05-02 NOTE — Progress Notes (Addendum)
Physical Therapy Treatment Patient Details Name: Anthony Ball MRN: [id number] DOB: 27-Aug-1934 Today's Date: 05/02/2023   History of Present Illness Anthony Ball is a 89 y.o. male  patient to hospital with shortness of breath and productive cough; influenza a with pneumonia, partial SBO with history of COPD, dementia, hypothyroidism    PT Comments  Pt progressing towards all goals. Pt oriented to self and hospital however questionable historian and report of PLOF. Pt has been doing well amb with RW and supervision however pt reports not using AD PTA. Trialed ambulation with Left hand held assist. Pt with short, shuffled steps with anterior bias and vearing to L/R requiring minA to maintain balance. Pt unsafe to return home alone at this time and would benefit from from inpatient rehab program < 3 hrs a day to allow for maximal functional return for safe transition home.   If plan is discharge home, recommend the following: A little help with walking and/or transfers;A lot of help with bathing/dressing/bathroom;Direct supervision/assist for financial management;Assistance with cooking/housework;Assist for transportation;Supervision due to cognitive status;Help with stairs or ramp for entrance   Can travel by private vehicle     Yes (likely soon)  Equipment Recommendations  Rolling walker (2 wheels);BSC/3in1    Recommendations for Other Services       Precautions / Restrictions Precautions Precautions: Fall Restrictions Weight Bearing Restrictions Per Provider Order: No     Mobility  Bed Mobility Overal bed mobility: Needs Assistance Bed Mobility: Supine to Sit     Supine to sit: Supervision     General bed mobility comments: increased time, HOB slightly elevated    Transfers Overall transfer level: Needs assistance Equipment used: 1 person hand held assist Transfers: Sit to/from Stand Sit to Stand: Contact guard assist           General transfer comment: increased  time, slow, guarded during powering up, pt with anterior bias    Ambulation/Gait Ambulation/Gait assistance: mod assist Gait Distance (Feet): 150 Feet Assistive device: 1 person hand held assist Gait Pattern/deviations: Trunk flexed, Drifts right/left, Shuffle Gait velocity: dec Gait velocity interpretation: <1.31 ft/sec, indicative of household ambulator   General Gait Details: worked on [employer] without AD this date. Pt with decreased cadence, step to/shuffle type gait pattern with anterior weight shift/bias/slight trunk flexion. Pt with lateral sway/vearing L/R. Per previous PT session documentation pt supervision with amb of RW however pt reports not typically using a RW. At this time pt continues to benefit from RW for safe amb   Stairs             Wheelchair Mobility     Tilt Bed    Modified Rankin (Stroke Patients Only)       Balance Overall balance assessment: Needs assistance Sitting-balance support: Feet supported Sitting balance-Leahy Scale: Good Sitting balance - Comments: sitting EOB   Standing balance support: Bilateral upper extremity supported, During functional activity Standing balance-Leahy Scale: Fair Standing balance comment: with RW support, poor without UE support, leans up to counter when washing hands                            Communication Communication Communication: Impaired Factors Affecting Communication: Hearing impaired  Cognition Arousal: Alert Behavior During Therapy: Flat affect   PT - Cognitive impairments: No family/caregiver present to determine baseline  PT - Cognition Comments: pt with delayed response time, difficulty sequencing, tangential Following commands: Intact      Cueing Cueing Techniques: Verbal cues  Exercises      General Comments General comments (skin integrity, edema, etc.): VSS. pt assisted to the bathroom, supervision for toileting and hygiene s/p BM. pt stood to  wash hands with close contact guard assist      Pertinent Vitals/Pain Pain Assessment Pain Assessment: No/denies pain    Home Living                          Prior Function            PT Goals (current goals can now be found in the care plan section) Acute Rehab PT Goals PT Goal Formulation: Patient unable to participate in goal setting Time For Goal Achievement: 05/13/23 Potential to Achieve Goals: Good Progress towards PT goals: Progressing toward goals    Frequency    Min 1X/week      PT Plan      Co-evaluation              AM-PAC PT "6 Clicks" Mobility   Outcome Measure  Help needed turning from your back to your side while in a flat bed without using bedrails?: None Help needed moving from lying on your back to sitting on the side of a flat bed without using bedrails?: None Help needed moving to and from a bed to a chair (including a wheelchair)?: A Little Help needed standing up from a chair using your arms (e.g., wheelchair or bedside chair)?: A Little Help needed to walk in hospital room?: A Lot Help needed climbing 3-5 steps with a railing? : A Lot 6 Click Score: 18    End of Session Equipment Utilized During Treatment: Gait belt Activity Tolerance: Patient tolerated treatment well Patient left: in chair;with chair alarm set;with call bell/phone within reach Nurse Communication: Mobility status PT Visit Diagnosis: Unsteadiness on feet (R26.81);Other abnormalities of gait and mobility (R26.89);Muscle weakness (generalized) (M62.81)     Time: 2440-1027 PT Time Calculation (min) (ACUTE ONLY): 23 min  Charges:    $Gait Training: 8-22 mins $Therapeutic Activity: 8-22 mins PT General Charges $$ ACUTE PT VISIT: 1 Visit                     Lewis Shock, PT, DPT Acute Rehabilitation Services Secure chat preferred Office #: [phone]    Iona Hansen 05/02/2023, 9:59 AM

## 2023-05-02 NOTE — Progress Notes (Signed)
Nutrition Follow-up  DOCUMENTATION CODES:   Severe malnutrition in context of chronic illness  INTERVENTION:  - DYS 2 diet with nectar thickened liquids per SLP. - Magic cup TID with meals, each supplement provides 290 kcal and 9 grams of protein - Mighty Shake TID with meals, each supplement provides 330 kcals and 9 grams of protein - B6 and zinc labs checked: both WNL - Continue MVI with minerals daily - Monitor weight trends.    NUTRITION DIAGNOSIS:   Severe Malnutrition related to chronic illness as evidenced by severe muscle depletion, severe fat depletion. *ongoing  GOAL:   Patient will meet greater than or equal to 90% of their needs *progressing  MONITOR:   PO intake, Weight trends, Supplement acceptance  REASON FOR ASSESSMENT:   LOS    ASSESSMENT:   Pt presented to ER with shortness of breath, productive cough, nausea/vomiting, and found to have partial SBO, pneumonia, renal stone, and influenza. Pt with PMH of dementia, COPD, hypothyroidism.  RD working remotely. Paitent is noted to have a history of dementia and according to PT notes today he is a questionable historian.  Per chart review, patient is consuming 25-100% of the documented meals with an average of 78%. However, no meal intakes over the past 3 days.  SLP continues to follow patient, he remains on a DYS 2 diet at this time. Patient continues to receive automatic trays in addition to [employer] with meals. He is currently medically stable for discharge, awaiting a bed at SNF.   Medications reviewed and include: MVI  Labs reviewed:  No BMP since 2/10   Latest Reference Range & Units 04/24/23 11:16  Vitamin B6 3.4 - 65.2 ug/L 10.8    Latest Reference Range & Units 04/24/23 11:16  Zinc 44 - 115 ug/dL 57    Diet Order:   Diet Order             DIET DYS 2           DIET DYS 2 Room service appropriate? No; Fluid consistency: Nectar Thick  Diet effective now                    EDUCATION NEEDS:  Education needs have been addressed  Skin:  Skin Assessment: Reviewed RN Assessment  Last BM:  2/11  Height:  Ht Readings from Last 1 Encounters:  04/24/23 5\' 6"  (1.676 m)   Weight:  Wt Readings from Last 1 Encounters:  04/24/23 54.6 kg   Ideal Body Weight:  64.5 kg  BMI:  Body mass index is 19.43 kg/m.  Estimated Nutritional Needs:  Kcal:  1500-1700 Protein:  80-95 g Fluid:  1.5-1.7 L    Shelle Iron RD, LDN Contact via Secure Chat.

## 2023-05-02 NOTE — Plan of Care (Signed)

## 2023-05-02 NOTE — Progress Notes (Signed)
Progress Note   Patient: Anthony Ball ZOX:[id number] DOB: 11/13/1934 DOA: 04/12/2023     19 DOS: the patient was seen and examined on 05/02/2023   Brief hospital course: 89yo with h/o dementia, COPD, and hypothyroidism who was admitted on 1/23 with influenza A with PNA, completed treatment with Ceftriaxone/Azithromycin.  He was subsequently found to have SBO as well as R UPJ stone with mild hydronephrosis.   SBO has resolved and surgery signed off.     Assessment and Plan:  Placement issue Lived at home PTA Unable to return home SNF declined by insurance company Family is arranging for private pay at [facility] [place] He is medically stable when a bed is available   Oral phase dysphagia SLP consulting Please on dysphagia 2 (chopped) diet with nectar thick liquids Has ongoing cough   Severe malnutrition Nutrition Problem: Severe Malnutrition Etiology: chronic illness Signs/Symptoms: severe muscle depletion, severe fat depletion Interventions: Magic cup   Influenza with PNA Resolved Treated with 5 days of Ceftriaxone and Azithromycin   SBO Resolved Surgery has signed off   R UPJ Stone Mild hydronephrosis on presentation, resolved on repeat CT Continue doxazosin Urology consulted and recommends outpatient f/u   Dementia Delirium precautions Continue memantine, Seroquel   Hypothyroidism Continue Synthroid   COPD Continue Albuterol Will add Mucinex       Consultants: Surgery Urology PT OT Nutrition SLP TOC team   Procedures: None   Antibiotics: Ceftriaxone x 5 days Azithromycin x 5 days   30 Day Unplanned Readmission Risk Score    Flowsheet Row ED to Hosp-Admission (Current) from 04/12/2023 in [place] [facility]  30 Day Unplanned Readmission Risk Score (%) 17.41 Filed at 05/02/2023 0400       This score is the patient's risk of an unplanned readmission within 30 days of being discharged (0 -100%). The score is based on dignosis,  age, lab data, medications, orders, and past utilization.   Low:  0-14.9   Medium: 15-21.9   High: 22-29.9   Extreme: 30 and above           Subjective: Feeling ok, slept well last night, no concerns.   Objective: Vitals:   05/01/23 2033 05/02/23 0448  BP: 125/76 113/66  Pulse: 87 68  Resp: 18 18  Temp: 98.5 F (36.9 C) 98.2 F (36.8 C)  SpO2: 92% 96%   No intake or output data in the 24 hours ending 05/02/23 0739 Filed Weights   04/12/23 1757 04/13/23 1725 04/24/23 0900  Weight: 61 kg 56.2 kg 54.6 kg    Exam:  General:  Appears calm and comfortable and is in NAD Eyes:  EOMI, normal lids, iris ENT:   hard of hearing, grossly normal lips & tongue, mmm Neck:  no LAD, masses or thyromegaly Cardiovascular:  RRR, no m/r/g. No LE edema.  Respiratory:   CTA bilaterally with no wheezes/rales/rhonchi.  Normal respiratory effort. Abdomen:  soft, NT, ND Skin:  no rash or induration seen on limited exam Musculoskeletal:  grossly normal tone BUE/BLE, good ROM, no bony abnormality Psychiatric:  blunted mood and affect, speech appropriate, AOx1 Neurologic:  CN 2-12 grossly intact, moves all extremities in coordinated fashion  Data Reviewed: I have reviewed the patient's lab results since admission.  Pertinent labs for today include:   None     Family Communication: None present; I left a voice mail for his sister  Disposition: Status is: Inpatient Remains inpatient appropriate because: unsafe disposition  Time spent: 25 minutes  Unresulted Labs (From admission, onward)    None        Author: Jonah Blue, MD 05/02/2023 7:39 AM  For on call review [email or web address].

## 2023-05-03 ENCOUNTER — Inpatient Hospital Stay (HOSPITAL_COMMUNITY): Payer: Medicare Other

## 2023-05-03 DIAGNOSIS — J09X1 Influenza due to identified novel influenza A virus with pneumonia: Secondary | ICD-10-CM | POA: Diagnosis not present

## 2023-05-03 NOTE — Progress Notes (Signed)
Progress Note   Patient: Anthony Ball ZOX:[id number] DOB: [birth year] DOA: 04/12/2023     20 DOS: the patient was seen and examined on 05/03/2023   Brief hospital course: 89yo with h/o dementia, COPD, and hypothyroidism who was admitted on 1/23 with influenza A with PNA, completed treatment with Ceftriaxone/Azithromycin.  He was subsequently found to have SBO as well as R UPJ stone with mild hydronephrosis.   SBO has resolved and surgery signed off.     Assessment and Plan:  Placement issue Lived at home PTA Unable to return home SNF declined by insurance company Family is arranging for private pay at [facility] [place] He is medically stable when a bed is available   Oral phase dysphagia SLP consulting Please on dysphagia 2 (chopped) diet with nectar thick liquids Has ongoing cough   Severe malnutrition Nutrition Problem: Severe Malnutrition Etiology: chronic illness Signs/Symptoms: severe muscle depletion, severe fat depletion Interventions: Magic cup   Influenza with PNA Resolved Treated with 5 days of Ceftriaxone and Azithromycin   SBO Resolved Surgery has signed off   R UPJ Stone Mild hydronephrosis on presentation, resolved on repeat CT Continue doxazosin Urology consulted and recommends outpatient f/u   Dementia Delirium precautions Continue memantine, Seroquel   Hypothyroidism Continue Synthroid   COPD Continue Albuterol Will add Mucinex       Consultants: Surgery Urology PT OT Nutrition SLP TOC team   Procedures: None   Antibiotics: Ceftriaxone x 5 days Azithromycin x 5 days    30 Day Unplanned Readmission Risk Score    Flowsheet Row ED to Hosp-Admission (Current) from 04/12/2023 in [place] [facility]  30 Day Unplanned Readmission Risk Score (%) 17.67 Filed at 05/03/2023 0400       This score is the patient's risk of an unplanned readmission within 30 days of being discharged (0 -100%). The score is based on dignosis,  age, lab data, medications, orders, and past utilization.   Low:  0-14.9   Medium: 15-21.9   High: 22-29.9   Extreme: 30 and above           Subjective: Pleasant, very conversant but clearly confused today.   Objective: Vitals:   05/02/23 2042 05/03/23 0546  BP: 121/61 123/63  Pulse: 79 70  Resp: 18 18  Temp: 97.9 F (36.6 C) 97.9 F (36.6 C)  SpO2: 97% 95%   No intake or output data in the 24 hours ending 05/03/23 0736 Filed Weights   04/12/23 1757 04/13/23 1725 04/24/23 0900  Weight: 61 kg 56.2 kg 54.6 kg    Exam:  General:  Appears calm and comfortable and is in NAD Eyes:  EOMI, normal lids, iris ENT:   hard of hearing, grossly normal lips & tongue, mmm Neck:  no LAD, masses or thyromegaly Cardiovascular:  RRR, no m/r/g. No LE edema.  Respiratory:   CTA bilaterally with no wheezes/rales/rhonchi.  Normal respiratory effort. Abdomen:  soft, NT, ND Skin:  no rash or induration seen on limited exam Musculoskeletal:  grossly normal tone BUE/BLE, good ROM, no bony abnormality Psychiatric:  blunted mood and affect, speech appropriate, AOx1 Neurologic:  CN 2-12 grossly intact, moves all extremities in coordinated fashion  Data Reviewed: I have reviewed the patient's lab results since admission.  Pertinent labs for today include:   None today     Family Communication: None present today  Disposition: Status is: Inpatient Remains inpatient appropriate because: unsafe disposition     Time spent: 25 minutes  Unresulted Labs (From admission, onward)    None        Author: Jonah Blue, MD 05/03/2023 7:36 AM  For on call review [email or web address].

## 2023-05-03 NOTE — Procedures (Signed)
Modified Barium Swallow Study  Patient Details  Name: Anthony Ball MRN: [id number] Date of Birth: 03-Jun-1934  Today's Date: 05/03/2023  Modified Barium Swallow completed.  Full report located under Chart Review in the Imaging Section.  History of Present Illness Anthony Ball is a 89 y.o. male with history of COPD, dementia, hypothyroidism who was recently evaluated by cardiologist for chest pain and had stress test on 04/02/2013 which was unremarkable with normal LV was brought to the ER after patient was complaining of shortness of breath and productive cough.  In the ER patient was hypoxic requiring 2 L oxygen with chest x-ray showing concerning features for infiltrates  Patient was started on empiric antibiotics for pneumonia. Found to have SBO resolved per MD noted 1/28. MD note says pt "ddid cough some with drinking water."   Clinical Impression Patient presents with improved swallow function with today's MBS as compared to MBS completed on 04/19/23. He continues with slow and prolonged mastication of solids, delayed anterior to posterior oral transit of boluses. Swallow continues to be initiated at level of pyriform sinus. PO's tested: thin, nectar thick, puree, solid. No penetration or aspiration observed with any of the tested consistencies during any phase of the swallow. Small volume boluses of liquids were not effective for full PES opening, leading to increased pharyngeal residuals in vallecular sinus, aryepiglottic folds and pyriform sinus.  Larger volume cup sips and straw sips of thin and nectar thick liquids were effective in achieving adequate PES opening. Pharyngeal residuals cleared (not completely) with subsequent swallows. Liquid sips also helped in clearing residuals from solid texture. SLP is recommending continue with Dys 2 (minced) solids and advance to thin liquids. Factors that may increase risk of adverse event in presence of aspiration Rubye Oaks & Clearance Coots 2021): Reduced  cognitive function  Swallow Evaluation Recommendations Recommendations: PO diet PO Diet Recommendation: Dysphagia 2 (Finely chopped);Thin liquids (Level 0) Liquid Administration via: Cup;Straw Medication Administration: Crushed with puree Supervision: Patient able to self-feed;Full supervision/cueing for swallowing strategies Swallowing strategies  : Small bites/sips;Slow rate;Multiple dry swallows after each bite/sip;Clear throat intermittently Postural changes: Position pt fully upright for meals Oral care recommendations: Oral care BID (2x/day);Staff/trained caregiver to provide oral care    Angela Nevin, MA, CCC-SLP Speech Therapy

## 2023-05-03 NOTE — Progress Notes (Signed)
Mobility Specialist: Progress Note   05/03/23 1242  Mobility  Activity Ambulated with assistance in hallway  Level of Assistance Standby assist, set-up cues, supervision of patient - no hands on  Assistive Device Front wheel walker  Distance Ambulated (ft) 350 ft  Activity Response Tolerated well  Mobility Referral Yes  Mobility visit 1 Mobility  Mobility Specialist Start Time (ACUTE ONLY) 1200  Mobility Specialist Stop Time (ACUTE ONLY) 1222  Mobility Specialist Time Calculation (min) (ACUTE ONLY) 22 min    Pt was agreeable to mobility session - received in bed. Mostly SV but CG for safety during turns. No complaints. Returned to room without fault. Left in chair with all needs met, call bell in reach. Chair alarm and restraint on.   Maurene Capes Mobility Specialist Please contact via SecureChat or Rehab office at [phone]

## 2023-05-03 NOTE — TOC Progression Note (Signed)
Transition of Care [facility]) - Progression Note    Patient Details  Name: Anthony Ball MRN: [id number] Date of Birth: 01-Jul-1934  Transition of Care [facility]) CM/SW Contact  Anthony Aguillard A [place], Anthony Ball Phone Number: 05/03/2023, 4:27 PM  Clinical Narrative:     Update 05/04/23 1610 CSW reached out to [place], pt's brother. She stated that her son had reached out to the facility to contact to compete paperwork for private pay. Anthony Ball stated that facility stated they would reach out today and schedule a  time for paperwork, hopefully over the weekend. CSW to follow up regarding if paperwork had been completed.   CSW reached out to admissions at [facility], [phone] and left VM to give update regarding family's visit and plan to completed.  Continues to be no answer or return phone call.   CSW also reached out to [facility] to inquire about bed offers, they currently do not have beds at their facility. CSW to reach back out again next week.   EDD to be determined.      CSW spoke with pt's sister Anthony Ball, stated that that she and pt's nephew are going to [facility] [facility] to get placement paperwork completed. She stated that she is concerned about long term financial payment, CSW informed her of process for applying for LT medicaid and discussing that option at facility in the long run.   She stated she would give CSW call tomorrow regarding paperwork process.   CSW reached out to admissions at [facility], [phone] and left VM to give update regarding family's visit and plan to complete paperwork and ask questions about placement.   TOC will continue to follow.   Expected Discharge Plan: Skilled Nursing Facility Barriers to Discharge: SNF Pending bed offer  Expected Discharge Plan and Services In-house Referral: Clinical Social Work     Living arrangements for the past 2 months: Single Family Home Expected Discharge Date: 04/20/23                                      Social Determinants of Health (SDOH) Interventions SDOH Screenings   Food Insecurity: No Food Insecurity (04/13/2023)  Housing: Low Risk  (04/13/2023)  Transportation Needs: No Transportation Needs (04/13/2023)  Utilities: Not At Risk (04/13/2023)  Social Connections: Unknown (04/13/2023)  Tobacco Use: Unknown (04/13/2023)    Readmission Risk Interventions     No data to display

## 2023-05-03 NOTE — Plan of Care (Signed)

## 2023-05-03 NOTE — Care Management Important Message (Signed)
Important Message  Patient Details  Name: Anthony Ball MRN: [id number] Date of Birth: [birth year]   Important Message Given:  Yes - Medicare IM     Dorena Bodo 05/03/2023, 3:21 PM

## 2023-05-03 NOTE — Plan of Care (Signed)
  Problem: Health Behavior/Discharge Planning: Goal: Ability to manage health-related needs will improve Outcome: Progressing   Problem: Clinical Measurements: Goal: Will remain free from infection Outcome: Progressing Goal: Cardiovascular complication will be avoided Outcome: Progressing   Problem: Activity: Goal: Risk for activity intolerance will decrease Outcome: Progressing   Problem: Nutrition: Goal: Adequate nutrition will be maintained Outcome: Progressing   Problem: Coping: Goal: Level of anxiety will decrease Outcome: Progressing   Problem: Elimination: Goal: Will not experience complications related to bowel motility Outcome: Progressing Goal: Will not experience complications related to urinary retention Outcome: Progressing   Problem: Pain Managment: Goal: General experience of comfort will improve and/or be controlled Outcome: Progressing   Problem: Safety: Goal: Ability to remain free from injury will improve Outcome: Progressing

## 2023-05-04 DIAGNOSIS — J09X1 Influenza due to identified novel influenza A virus with pneumonia: Secondary | ICD-10-CM | POA: Diagnosis not present

## 2023-05-04 NOTE — TOC Progression Note (Signed)
Transition of Care [facility]) - Progression Note    Patient Details  Name: Anthony Ball MRN: [id number] Date of Birth: 20-Mar-1935  Transition of Care [facility] CM/SW Contact  Janae Bridgeman, RN Phone Number: 05/04/2023, 4:21 PM  Clinical Narrative:    CM called and left a detailed message with [facility] - [phone] regarding private pay placement at the facility.  The patient's sister Anthony Ball, has been calling the facility and unable to reach admission to fill out admissions paperwork and pay privately to have patient placed at the facility.   Expected Discharge Plan: Skilled Nursing Facility Barriers to Discharge: SNF Pending bed offer  Expected Discharge Plan and Services In-house Referral: Clinical Social Work     Living arrangements for the past 2 months: Single Family Home Expected Discharge Date: 04/20/23                                     Social Determinants of Health (SDOH) Interventions SDOH Screenings   Food Insecurity: No Food Insecurity (04/13/2023)  Housing: Low Risk  (04/13/2023)  Transportation Needs: No Transportation Needs (04/13/2023)  Utilities: Not At Risk (04/13/2023)  Social Connections: Unknown (04/13/2023)  Tobacco Use: Unknown (04/13/2023)    Readmission Risk Interventions     No data to display

## 2023-05-04 NOTE — Progress Notes (Signed)
Progress Note   Patient: Anthony Ball:[id number] DOB: 04/27/1934 DOA: 04/12/2023     21 DOS: the patient was seen and examined on 05/04/2023   Brief hospital course: 88yo with h/o dementia, COPD, and hypothyroidism who was admitted on 1/23 with influenza A with PNA, completed treatment with Ceftriaxone/Azithromycin.  He was subsequently found to have SBO as well as R UPJ stone with mild hydronephrosis.   SBO has resolved and surgery signed off.     Assessment and Plan:  Placement issue Lived at home PTA Unable to return home SNF declined by insurance company Family is arranging for private pay at [facility] [place] He is medically stable when a bed is available   Oral phase dysphagia SLP consulting Currently on dysphagia 2 (chopped) diet with nectar thick liquids   Severe malnutrition Nutrition Problem: Severe Malnutrition Etiology: chronic illness Signs/Symptoms: severe muscle depletion, severe fat depletion Interventions: Magic cup   Influenza with PNA Resolved Treated with 5 days of Ceftriaxone and Azithromycin   SBO Resolved Surgery has signed off   R UPJ Stone Mild hydronephrosis on presentation, resolved on repeat CT Continue doxazosin Urology consulted and recommends outpatient f/u   Dementia Delirium precautions Continue memantine, Seroquel Need to dc the posey belt so that patient can go to SNF when possible   Hypothyroidism Continue Synthroid   COPD Continue Albuterol Will add Mucinex       Consultants: Surgery Urology PT OT Nutrition SLP TOC team   Procedures: None   Antibiotics: Ceftriaxone x 5 days Azithromycin x 5 days   30 Day Unplanned Readmission Risk Score    Flowsheet Row ED to Hosp-Admission (Current) from 04/12/2023 in [place] [facility]  30 Day Unplanned Readmission Risk Score (%) 15.65 Filed at 05/04/2023 0801       This score is the patient's risk of an unplanned readmission within 30 days of being  discharged (0 -100%). The score is based on dignosis, age, lab data, medications, orders, and past utilization.   Low:  0-14.9   Medium: 15-21.9   High: 22-29.9   Extreme: 30 and above           Subjective: Sleeping throughout evaluation in bedside chair with posey belt   Objective: Vitals:   05/03/23 2103 05/04/23 0805  BP: 117/67 108/69  Pulse: 87 69  Resp: 18 17  Temp: 97.7 F (36.5 C) (!) 97.2 F (36.2 C)  SpO2: 94% 93%   No intake or output data in the 24 hours ending 05/04/23 1430  Filed Weights   04/12/23 1757 04/13/23 1725 04/24/23 0900  Weight: 61 kg 56.2 kg 54.6 kg    Exam:  General:  Appears calm and comfortable and is in NAD Eyes:  EOMI, normal lids, iris ENT:   hard of hearing, grossly normal lips & tongue, mmm Neck:  no LAD, masses or thyromegaly Cardiovascular:  RRR, no m/r/g. No LE edema.  Respiratory:   CTA bilaterally with no wheezes/rales/rhonchi.  Normal respiratory effort. Abdomen:  soft, NT, ND Skin:  no rash or induration seen on limited exam Musculoskeletal:  grossly normal tone BUE/BLE, good ROM, no bony abnormality Psychiatric:  sleeping comfortably today Neurologic:  unable to effectively perform  Data Reviewed: I have reviewed the patient's lab results since admission.  Pertinent labs for today include:   None today     Family Communication: None present  Disposition: Status is: Inpatient Remains inpatient appropriate because: unsafe disposition     Time  spent: 25 minutes  Unresulted Labs (From admission, onward)     Start     Ordered   05/05/23 0500  CBC with Differential/Platelet  Tomorrow morning,   R       Question:  Specimen collection method  Answer:  Lab=Lab collect   05/04/23 0814   05/05/23 0500  Basic metabolic panel  Tomorrow morning,   R       Question:  Specimen collection method  Answer:  Lab=Lab collect   05/04/23 1478             Author: Jonah Blue, MD 05/04/2023 2:30 PM  For on call  review [email or web address].

## 2023-05-04 NOTE — Plan of Care (Signed)

## 2023-05-04 NOTE — Plan of Care (Signed)
  Problem: Education: Goal: Knowledge of General Education information will improve Description: Including pain rating scale, medication(s)/side effects and non-pharmacologic comfort measures Outcome: Progressing   Problem: Health Behavior/Discharge Planning: Goal: Ability to manage health-related needs will improve Outcome: Progressing   Problem: Clinical Measurements: Goal: Will remain free from infection Outcome: Progressing   Problem: Nutrition: Goal: Adequate nutrition will be maintained Outcome: Progressing   Problem: Coping: Goal: Level of anxiety will decrease Outcome: Progressing   Problem: Elimination: Goal: Will not experience complications related to bowel motility Outcome: Progressing Goal: Will not experience complications related to urinary retention Outcome: Progressing   Problem: Safety: Goal: Ability to remain free from injury will improve Outcome: Progressing   Problem: Skin Integrity: Goal: Risk for impaired skin integrity will decrease Outcome: Progressing   Problem: Activity: Goal: Ability to tolerate increased activity will improve Outcome: Progressing

## 2023-05-04 NOTE — Progress Notes (Signed)
Physical Therapy Treatment Patient Details Name: Anthony Ball MRN: [id number] DOB: 15-Mar-1935 Today's Date: 05/04/2023   History of Present Illness Anthony Ball is a 89 y.o. male  patient to hospital with shortness of breath and productive cough; influenza a with pneumonia, partial SBO with history of COPD, dementia, hypothyroidism    PT Comments  Received pt semi-reclined in bed asleep but easily aroused and agreeable to OOB mobility. Pt performed bed mobility with supervision with HOB elevated (approaching mod I) and stood from EOB with RW and supervision. Pt ambulated ~213ft with RW and supervision through hallway with verbal cues. Pt unaware of date (Valentine's Day) and with flat affect. Returned to room and upon approaching recliner, pt abandoning RW and taking a few steps to recliner with close supervision. Recommend 24/7 supervision upon discharge and pt left sitting in recliner set up to eat breakfast and NT planning to provide full supervision. Acute PT to cont to follow.     If plan is discharge home, recommend the following: A little help with walking and/or transfers;A lot of help with bathing/dressing/bathroom;Direct supervision/assist for financial management;Assistance with cooking/housework;Assist for transportation;Supervision due to cognitive status;Help with stairs or ramp for entrance   Can travel by private vehicle     Yes  Equipment Recommendations  Rolling walker (2 wheels);BSC/3in1    Recommendations for Other Services       Precautions / Restrictions Precautions Precautions: Fall Restrictions Weight Bearing Restrictions Per Provider Order: No Other Position/Activity Restrictions: watch O2 sats on room air     Mobility  Bed Mobility Overal bed mobility: Needs Assistance Bed Mobility: Rolling, Supine to Sit Rolling: Supervision   Supine to sit: Supervision     General bed mobility comments: HOB slightly elevated and increased time Patient Response:  Flat affect  Transfers Overall transfer level: Needs assistance Equipment used: Rolling walker (2 wheels) Transfers: Sit to/from Stand Sit to Stand: Supervision           General transfer comment: stood from EOB with RW and supervision.    Ambulation/Gait Ambulation/Gait assistance: Supervision Gait Distance (Feet): 250 Feet Assistive device: Rolling walker (2 wheels) Gait Pattern/deviations: Shuffle, Trunk flexed, Narrow base of support Gait velocity: decreased Gait velocity interpretation: 1.31 - 2.62 ft/sec, indicative of limited Financial controller     Tilt Bed Tilt Bed Patient Response: Flat affect  Modified Rankin (Stroke Patients Only)       Balance Overall balance assessment: Needs assistance Sitting-balance support: Feet supported, Bilateral upper extremity supported Sitting balance-Leahy Scale: Good Sitting balance - Comments: sitting EOB   Standing balance support: Bilateral upper extremity supported, During functional activity (RW) Standing balance-Leahy Scale: Fair Standing balance comment: able to maintain static and dynamic standing balance with BUE support and supervision                            Communication Communication Communication: Impaired Factors Affecting Communication: Hearing impaired  Cognition Arousal: Alert Behavior During Therapy: Flat affect   PT - Cognitive impairments: No family/caregiver present to determine baseline                       PT - Cognition Comments: pt unable to determine date, despite hints that it is Valentine's Day Following commands: Intact      Cueing Cueing Techniques: Verbal  cues  Exercises      General Comments General comments (skin integrity, edema, etc.): upon approaching recliner, pt abandoning RW but able to take a few steps and tranfer to recliner without AD and close supervision.      Pertinent Vitals/Pain  Pain Assessment Pain Assessment: No/denies pain    Home Living                          Prior Function            PT Goals (current goals can now be found in the care plan section) Acute Rehab PT Goals PT Goal Formulation: Patient unable to participate in goal setting Time For Goal Achievement: 05/13/23 Potential to Achieve Goals: Good Progress towards PT goals: Progressing toward goals    Frequency    Min 1X/week      PT Plan      Co-evaluation              AM-PAC PT "6 Clicks" Mobility   Outcome Measure  Help needed turning from your back to your side while in a flat bed without using bedrails?: None Help needed moving from lying on your back to sitting on the side of a flat bed without using bedrails?: None Help needed moving to and from a bed to a chair (including a wheelchair)?: A Little Help needed standing up from a chair using your arms (e.g., wheelchair or bedside chair)?: A Little Help needed to walk in hospital room?: A Little Help needed climbing 3-5 steps with a railing? : A Lot 6 Click Score: 19    End of Session   Activity Tolerance: Patient tolerated treatment well Patient left: in chair;with chair alarm set;with call bell/phone within reach Nurse Communication: Mobility status PT Visit Diagnosis: Unsteadiness on feet (R26.81);Other abnormalities of gait and mobility (R26.89);Muscle weakness (generalized) (M62.81)     Time: 1610-9604 PT Time Calculation (min) (ACUTE ONLY): 13 min  Charges:    $Gait Training: 8-22 mins PT General Charges $$ ACUTE PT VISIT: 1 Visit                     Blima Rich PT, DPT [facility] Zaunegger 05/04/2023, 9:20 AM

## 2023-05-05 DIAGNOSIS — J09X1 Influenza due to identified novel influenza A virus with pneumonia: Secondary | ICD-10-CM | POA: Diagnosis not present

## 2023-05-05 LAB — BASIC METABOLIC PANEL
Anion gap: 10 (ref 5–15)
BUN: 25 mg/dL — ABNORMAL HIGH (ref 8–23)
CO2: 23 mmol/L (ref 22–32)
Calcium: 9.1 mg/dL (ref 8.9–10.3)
Chloride: 103 mmol/L (ref 98–111)
Creatinine, Ser: 1 mg/dL (ref 0.61–1.24)
GFR, Estimated: >60 mL/min (ref >60)
Glucose, Bld: 92 mg/dL (ref 70–99)
Potassium: 4.1 mmol/L (ref 3.5–5.1)
Sodium: 136 mmol/L (ref 135–145)

## 2023-05-05 LAB — CBC WITH DIFFERENTIAL/PLATELET
Abs Immature Granulocytes: 0.07 10*3/uL (ref 0.00–0.07)
Basophils Absolute: 0.1 10*3/uL (ref 0.0–0.1)
Basophils Relative: 1 %
Eosinophils Absolute: 1 10*3/uL — ABNORMAL HIGH (ref 0.0–0.5)
Eosinophils Relative: 14 %
HCT: 41.5 % (ref 39.0–52.0)
Hemoglobin: 13.3 g/dL (ref 13.0–17.0)
Immature Granulocytes: 1 %
Lymphocytes Relative: 25 %
Lymphs Abs: 1.8 10*3/uL (ref 0.7–4.0)
MCH: 28.3 pg (ref 26.0–34.0)
MCHC: 32 g/dL (ref 30.0–36.0)
MCV: 88.3 fL (ref 80.0–100.0)
Monocytes Absolute: 0.5 10*3/uL (ref 0.1–1.0)
Monocytes Relative: 7 %
Neutro Abs: 3.7 10*3/uL (ref 1.7–7.7)
Neutrophils Relative %: 52 %
Platelets: 421 10*3/uL — ABNORMAL HIGH (ref 150–400)
RBC: 4.7 MIL/uL (ref 4.22–5.81)
RDW: 15 % (ref 11.5–15.5)
WBC: 7.1 10*3/uL (ref 4.0–10.5)
nRBC: 0 % (ref 0.0–0.2)

## 2023-05-05 NOTE — Progress Notes (Signed)
 Progress Note   Patient: Anthony Ball:[id number] DOB: 12/09/1934 DOA: 04/12/2023     22 DOS: the patient was seen and examined on 05/05/2023   Brief hospital course: 89yo with h/o dementia, COPD, and hypothyroidism who was admitted on 1/23 with influenza A with PNA, completed treatment with Ceftriaxone/Azithromycin.  He was subsequently found to have SBO as well as R UPJ stone with mild hydronephrosis.   SBO has resolved and surgery signed off.     Assessment and Plan:   Placement issue Lived at home PTA Unable to return home SNF declined by insurance company Family is arranging for private pay at [facility] [place] He is medically stable when a bed is available   Oral phase dysphagia SLP consulting Currently on dysphagia 2 (chopped) diet with nectar thick liquids   Severe malnutrition Nutrition Problem: Severe Malnutrition Etiology: chronic illness Signs/Symptoms: severe muscle depletion, severe fat depletion Interventions: Magic cup   Influenza with PNA Resolved Treated with 5 days of Ceftriaxone and Azithromycin   SBO Resolved Surgery has signed off   R UPJ Stone Mild hydronephrosis on presentation, resolved on repeat CT Continue doxazosin Urology consulted and recommends outpatient f/u   Dementia Delirium precautions Continue memantine, Seroquel Posey belt discontinued on 2/14   Hypothyroidism Continue Synthroid   COPD Continue Albuterol Will add Mucinex       Consultants: Surgery Urology PT OT Nutrition SLP TOC team   Procedures: None   Antibiotics: Ceftriaxone x 5 days Azithromycin x 5 days   30 Day Unplanned Readmission Risk Score    Flowsheet Row ED to Hosp-Admission (Current) from 04/12/2023 in [place] [facility] Unit  30 Day Unplanned Readmission Risk Score (%) 16.31 Filed at 05/05/2023 0400       This score is the patient's risk of an unplanned readmission within 30 days of being discharged (0 -100%). The score is  based on dignosis, age, lab data, medications, orders, and past utilization.   Low:  0-14.9   Medium: 15-21.9   High: 22-29.9   Extreme: 30 and above           Subjective: Pleasant, recognizes me but he is not sure why (this is our 6th day in a row).   Objective: Vitals:   05/04/23 1606 05/05/23 0901  BP: 115/75 129/71  Pulse: 78 66  Resp: 18   Temp: (!) 97.3 F (36.3 C) 98 F (36.7 C)  SpO2: 95% 95%   No intake or output data in the 24 hours ending 05/05/23 1455 Filed Weights   04/12/23 1757 04/13/23 1725 04/24/23 0900  Weight: 61 kg 56.2 kg 54.6 kg    Exam:  General:  Appears calm and comfortable and is in NAD Eyes:  EOMI, normal lids, iris ENT:   hard of hearing, grossly normal lips & tongue, mmm Neck:  no LAD, masses or thyromegaly Cardiovascular:  RRR, no m/r/g. No LE edema.  Respiratory:   CTA bilaterally with no wheezes/rales/rhonchi.  Normal respiratory effort. Abdomen:  soft, NT, ND Skin:  no rash or induration seen on limited exam Musculoskeletal:  grossly normal tone BUE/BLE, good ROM, no bony abnormality Psychiatric:  sleeping comfortably today Neurologic:  unable to effectively perform  Data Reviewed: I have reviewed the patient's lab results since admission.  Pertinent labs for today include:   Stable BMP, CBC     Family Communication: None present; I called and left a message for his sister  Disposition: Status is: Inpatient Remains inpatient appropriate  because: unsafe disposition     Time spent: 25 minutes  Unresulted Labs (From admission, onward)    None        Author: Jonah Blue, MD 05/05/2023 2:55 PM  For on call review [email or web address].

## 2023-05-05 NOTE — Progress Notes (Signed)
 Mobility Specialist Progress Note   05/05/23 1535  Mobility  Activity Ambulated with assistance in hallway  Level of Assistance Standby assist, set-up cues, supervision of patient - no hands on  Assistive Device Front wheel walker  Distance Ambulated (ft) 300 ft  Range of Motion/Exercises Active;All extremities  Activity Response Tolerated well   Patient received in supine and agreeable to participate. Stood and ambulated min guard to supervision with slow steady gait. Returned to room without complaint or incident. Was left in supine with all needs met, call bell in reach.   [place] Mariane Burpee, BS EXP Mobility Specialist Please contact via SecureChat or Rehab office at [phone]

## 2023-05-05 NOTE — Plan of Care (Signed)

## 2023-05-05 NOTE — Plan of Care (Signed)
  Problem: Education: Goal: Knowledge of General Education information will improve Description: Including pain rating scale, medication(s)/side effects and non-pharmacologic comfort measures Outcome: Progressing   Problem: Health Behavior/Discharge Planning: Goal: Ability to manage health-related needs will improve Outcome: Progressing   Problem: Clinical Measurements: Goal: Ability to maintain clinical measurements within normal limits will improve Outcome: Progressing Goal: Will remain free from infection Outcome: Progressing Goal: Respiratory complications will improve Outcome: Progressing   Problem: Activity: Goal: Risk for activity intolerance will decrease Outcome: Progressing   Problem: Nutrition: Goal: Adequate nutrition will be maintained Outcome: Progressing   Problem: Coping: Goal: Level of anxiety will decrease Outcome: Progressing   Problem: Elimination: Goal: Will not experience complications related to bowel motility Outcome: Progressing Goal: Will not experience complications related to urinary retention Outcome: Progressing   Problem: Pain Managment: Goal: General experience of comfort will improve and/or be controlled Outcome: Progressing   Problem: Safety: Goal: Ability to remain free from injury will improve Outcome: Progressing   Problem: Skin Integrity: Goal: Risk for impaired skin integrity will decrease Outcome: Progressing   Problem: Activity: Goal: Ability to tolerate increased activity will improve Outcome: Progressing

## 2023-05-06 DIAGNOSIS — J09X1 Influenza due to identified novel influenza A virus with pneumonia: Secondary | ICD-10-CM | POA: Diagnosis not present

## 2023-05-06 NOTE — Progress Notes (Incomplete)
Patient's sister is at bed side and she wants social worker to call her other sister Rayfield Citizen, on [phone] , regarding the SNF placement

## 2023-05-06 NOTE — Plan of Care (Signed)
  Problem: Education: Goal: Knowledge of General Education information will improve Description: Including pain rating scale, medication(s)/side effects and non-pharmacologic comfort measures Outcome: Progressing   Problem: Health Behavior/Discharge Planning: Goal: Ability to manage health-related needs will improve Outcome: Progressing   Problem: Clinical Measurements: Goal: Will remain free from infection Outcome: Progressing   Problem: Activity: Goal: Risk for activity intolerance will decrease Outcome: Progressing   Problem: Nutrition: Goal: Adequate nutrition will be maintained Outcome: Progressing   Problem: Coping: Goal: Level of anxiety will decrease Outcome: Progressing   Problem: Elimination: Goal: Will not experience complications related to bowel motility Outcome: Progressing   Problem: Pain Managment: Goal: General experience of comfort will improve and/or be controlled Outcome: Progressing   Problem: Safety: Goal: Ability to remain free from injury will improve Outcome: Progressing   Problem: Skin Integrity: Goal: Risk for impaired skin integrity will decrease Outcome: Progressing   Problem: Activity: Goal: Ability to tolerate increased activity will improve Outcome: Progressing   Problem: Clinical Measurements: Goal: Ability to maintain a body temperature in the normal range will improve Outcome: Progressing   Problem: Respiratory: Goal: Ability to maintain adequate ventilation will improve Outcome: Progressing Goal: Ability to maintain a clear airway will improve Outcome: Progressing

## 2023-05-06 NOTE — TOC Progression Note (Signed)
 Transition of Care [facility]) - Progression Note    Patient Details  Name: MARCIN HOLTE MRN: [id number] Date of Birth: 12-12-1934  Transition of Care [facility]) CM/SW Contact  Dellie Burns [place], [place] Phone Number: 05/06/2023, 2:44 PM  Clinical Narrative:  Spoke to pt's sister Eber Jones re SNF placement at her request. Per Lona Kettle with [facility] [place] has not returned family phone calls to complete admission paperwork. SW left voicemail for [employer] [phone] requesting return call with update. SW will follow.   Dellie Burns, MSW, LCSW [phone] (coverage)      Expected Discharge Plan: Skilled Nursing Facility Barriers to Discharge: SNF Pending bed offer  Expected Discharge Plan and Services In-house Referral: Clinical Social Work     Living arrangements for the past 2 months: Single Family Home Expected Discharge Date: 04/20/23                                     Social Determinants of Health (SDOH) Interventions SDOH Screenings   Food Insecurity: No Food Insecurity (04/13/2023)  Housing: Low Risk  (04/13/2023)  Transportation Needs: No Transportation Needs (04/13/2023)  Utilities: Not At Risk (04/13/2023)  Social Connections: Unknown (04/13/2023)  Tobacco Use: Unknown (04/13/2023)    Readmission Risk Interventions     No data to display

## 2023-05-06 NOTE — Progress Notes (Signed)
 Progress Note   Patient: Anthony Ball ZOX:[id number] DOB: [birth year] DOA: 04/12/2023     23 DOS: the patient was seen and examined on 05/06/2023   Brief hospital course: 88yo with h/o dementia, COPD, and hypothyroidism who was admitted on 1/23 with influenza A with PNA, completed treatment with Ceftriaxone/Azithromycin.  He was subsequently found to have SBO as well as R UPJ stone with mild hydronephrosis.   SBO has resolved and surgery signed off.     Assessment and Plan:  Placement issue Lived at home PTA Unable to return home SNF declined by insurance company Family is arranging for private pay at [facility] [place] He is medically stable when a bed is available   Oral phase dysphagia SLP consulting Currently on dysphagia 2 (chopped) diet with nectar thick liquids   Severe malnutrition Nutrition Problem: Severe Malnutrition Etiology: chronic illness Signs/Symptoms: severe muscle depletion, severe fat depletion Interventions: Magic cup   Influenza with PNA Resolved Treated with 5 days of Ceftriaxone and Azithromycin   SBO Resolved Surgery has signed off   R UPJ Stone Mild hydronephrosis on presentation, resolved on repeat CT Continue doxazosin Urology consulted and recommends outpatient f/u   Dementia Delirium precautions Continue memantine, Seroquel Posey belt discontinued on 2/14   Hypothyroidism Continue Synthroid   COPD Continue Albuterol Will add Mucinex       Consultants: Surgery Urology PT OT Nutrition SLP TOC team   Procedures: None   Antibiotics: Ceftriaxone x 5 days Azithromycin x 5 days  30 Day Unplanned Readmission Risk Score    Flowsheet Row ED to Hosp-Admission (Current) from 04/12/2023 in [place] [facility]  30 Day Unplanned Readmission Risk Score (%) 18.63 Filed at 05/06/2023 0401       This score is the patient's risk of an unplanned readmission within 30 days of being discharged (0 -100%). The score is  based on dignosis, age, lab data, medications, orders, and past utilization.   Low:  0-14.9   Medium: 15-21.9   High: 22-29.9   Extreme: 30 and above           Subjective: Pleasant, no specific complaints today.   Objective: Vitals:   05/05/23 1603 05/06/23 0426  BP: 120/67 112/69  Pulse: 83 73  Resp:  17  Temp: 97.9 F (36.6 C) 97.7 F (36.5 C)  SpO2: 95% 92%    Intake/Output Summary (Last 24 hours) at 05/06/2023 8119 Last data filed at 05/05/2023 2123 Gross per 24 hour  Intake 600 ml  Output 400 ml  Net 200 ml   Filed Weights   04/12/23 1757 04/13/23 1725 04/24/23 0900  Weight: 61 kg 56.2 kg 54.6 kg    Exam:  General:  Appears calm and comfortable and is in NAD Eyes:  EOMI, normal lids, iris ENT:   hard of hearing, grossly normal lips & tongue, mmm Neck:  no LAD, masses or thyromegaly Cardiovascular:  RRR, no m/r/g. No LE edema.  Respiratory:   CTA bilaterally with no wheezes/rales/rhonchi.  Normal respiratory effort. Abdomen:  soft, NT, ND Skin:  no rash or induration seen on limited exam Musculoskeletal:  grossly normal tone BUE/BLE, good ROM, no bony abnormality Psychiatric:  sleeping comfortably today Neurologic:  unable to effectively perform  Data Reviewed: I have reviewed the patient's lab results since admission.  Pertinent labs for today include:   None today     Family Communication: None present; message left for his sister yesterday  Disposition: Status is: Inpatient Remains  inpatient appropriate because: needs placement     Time spent: 25 minutes  Unresulted Labs (From admission, onward)    None        Author: Jonah Blue, MD 05/06/2023 7:22 AM  For on call review [email or web address].

## 2023-05-07 DIAGNOSIS — J09X1 Influenza due to identified novel influenza A virus with pneumonia: Secondary | ICD-10-CM | POA: Diagnosis not present

## 2023-05-07 NOTE — Plan of Care (Signed)

## 2023-05-07 NOTE — Progress Notes (Signed)
 Mobility Specialist Progress Note:   05/07/23 1231  Mobility  Activity Ambulated with assistance in hallway  Level of Assistance Contact guard assist, steadying assist  Assistive Device Front wheel walker  Distance Ambulated (ft) 300 ft  Activity Response Tolerated well  Mobility Referral Yes  Mobility visit 1 Mobility  Mobility Specialist Start Time (ACUTE ONLY) 1122  Mobility Specialist Stop Time (ACUTE ONLY) 1137  Mobility Specialist Time Calculation (min) (ACUTE ONLY) 15 min   Received pt seated EOB having no complaints and agreeable to mobility. Pt was asymptomatic throughout ambulation and returned to room w/o fault. Left in chair w/ call bell in reach and all needs met. Chair alarm on.    Thompson Grayer Mobility Specialist  Please contact vis Secure Chat or  Rehab Office [phone]

## 2023-05-07 NOTE — TOC Progression Note (Signed)
 Transition of Care [facility]) - Progression Note    Patient Details  Name: Anthony Ball MRN: [id number] Date of Birth: 1934-05-07  Transition of Care [facility]) CM/SW Contact  Jony Ladnier A [place], LCSW Phone Number: 05/07/2023, 5:03 PM  Clinical Narrative:     CSW sent updated documentation for [facility] [place] for review. They are in contact with family regarding paperwork for facility placement.    TOC will continue to follow.   Expected Discharge Plan: Skilled Nursing Facility Barriers to Discharge: SNF Pending bed offer  Expected Discharge Plan and Services In-house Referral: Clinical Social Work     Living arrangements for the past 2 months: Single Family Home Expected Discharge Date: 04/20/23                                     Social Determinants of Health (SDOH) Interventions SDOH Screenings   Food Insecurity: No Food Insecurity (04/13/2023)  Housing: Low Risk  (04/13/2023)  Transportation Needs: No Transportation Needs (04/13/2023)  Utilities: Not At Risk (04/13/2023)  Social Connections: Unknown (04/13/2023)  Tobacco Use: Unknown (04/13/2023)    Readmission Risk Interventions     No data to display

## 2023-05-07 NOTE — Progress Notes (Signed)
 Physical Therapy Treatment Patient Details Name: CHRITOPHER COSTER MRN: [id number] DOB: 24-Aug-1934 Today's Date: 05/07/2023   History of Present Illness JARAMIE BASTOS is a 89 y.o. male  patient to hospital with shortness of breath and productive cough; influenza a with pneumonia, partial SBO with history of COPD, dementia, hypothyroidism    PT Comments  Pt seen for PT tx with pt pleasantly confused, agreeable to tx. Pt is able to ambulate without AD with CGA with shuffled pattern & absent heel strike with pt unsuccessfully attempting to progress to more normalized gait pattern. Pt agreeable to using walker & is able to ambulate with same gait pattern but supervision 2/2 increased balance with BUE support. Pt engaged in retrograde walking with focus on high level balance training & STS without BUE support for strengthening. Continue to recommend ongoing PT services to progress mobility as able.    If plan is discharge home, recommend the following: A little help with walking and/or transfers;A little help with bathing/dressing/bathroom;Assistance with cooking/housework;Direct supervision/assist for financial management;Assist for transportation;Help with stairs or ramp for entrance;Direct supervision/assist for medications management;Supervision due to cognitive status   Can travel by private vehicle     Yes  Equipment Recommendations  Rolling walker (2 wheels);BSC/3in1    Recommendations for Other Services       Precautions / Restrictions Precautions Precautions: Fall Restrictions Weight Bearing Restrictions Per Provider Order: No     Mobility  Bed Mobility               General bed mobility comments: not tested, pt received & left sitting in recliner    Transfers Overall transfer level: Needs assistance Equipment used: None Transfers: Sit to/from Stand Sit to Stand: Supervision           General transfer comment: Pt is able to transfer STS without AD with supervision     Ambulation/Gait Ambulation/Gait assistance: Supervision, Contact guard assist Gait Distance (Feet): 100 Feet (+ >300 ft) Assistive device: None, Rolling walker (2 wheels) Gait Pattern/deviations: Decreased step length - right, Decreased dorsiflexion - right, Decreased stride length, Decreased step length - left, Decreased dorsiflexion - left, Shuffle Gait velocity: decreased     General Gait Details: Pt initially ambulates without AD with decreased dorsiflexion & heel strike BLE. PT provides cuing for more normalized gait pattern with pt taking increased step length thus causing decreased balance. Pt agreeable to use RW & continues to demonstrate impaired gait pattern but with improved balance.   Stairs             Wheelchair Mobility     Tilt Bed    Modified Rankin (Stroke Patients Only)       Balance Overall balance assessment: Needs assistance Sitting-balance support: Feet supported, Bilateral upper extremity supported Sitting balance-Leahy Scale: Good     Standing balance support: During functional activity, No upper extremity supported Standing balance-Leahy Scale: Fair                              Hotel manager: Impaired Factors Affecting Communication: Hearing impaired  Cognition Arousal: Alert Behavior During Therapy: Flat affect   PT - Cognitive impairments: No family/caregiver present to determine baseline, Orientation, Memory, Awareness, Attention, Initiation, Safety/Judgement, Sequencing, Problem solving                         Following commands: Impaired Following commands impaired: Only follows one step  commands consistently, Follows one step commands with increased time    Cueing Cueing Techniques: Verbal cues, Tactile cues, Gestural cues  Exercises Other Exercises Other Exercises: Pt engaged in retrograde gait x 10 ft with min assist without BUE support with focus on high level balance with  pt continuing to demonstrate decreased step length, shuffled gait. Other Exercises: Pt performed 5x STS from recliner without BUE support with CGA<>min assist with focus on BLE strengthening & overall endurance training.    General Comments        Pertinent Vitals/Pain Pain Assessment Pain Assessment: No/denies pain    Home Living                          Prior Function            PT Goals (current goals can now be found in the care plan section) Acute Rehab PT Goals Patient Stated Goal: keep working on getting stronger PT Goal Formulation: Patient unable to participate in goal setting Time For Goal Achievement: 05/13/23 Potential to Achieve Goals: Good Progress towards PT goals: Progressing toward goals    Frequency    Min 1X/week      PT Plan      Co-evaluation              AM-PAC PT "6 Clicks" Mobility   Outcome Measure  Help needed turning from your back to your side while in a flat bed without using bedrails?: None Help needed moving from lying on your back to sitting on the side of a flat bed without using bedrails?: None Help needed moving to and from a bed to a chair (including a wheelchair)?: A Little Help needed standing up from a chair using your arms (e.g., wheelchair or bedside chair)?: A Little Help needed to walk in hospital room?: A Little Help needed climbing 3-5 steps with a railing? : A Little 6 Click Score: 20    End of Session   Activity Tolerance: Patient tolerated treatment well Patient left: in chair;with chair alarm set;with call bell/phone within reach (belt alarm set (was on pt when PT entered room)) Nurse Communication: Mobility status PT Visit Diagnosis: Unsteadiness on feet (R26.81);Other abnormalities of gait and mobility (R26.89);Muscle weakness (generalized) (M62.81)     Time: 4742-5956 PT Time Calculation (min) (ACUTE ONLY): 16 min  Charges:    $Therapeutic Activity: 8-22 mins PT General Charges $$  ACUTE PT VISIT: 1 Visit                     Aleda Grana, PT, DPT 05/07/23, 3:07 PM   Sandi Mariscal 05/07/2023, 3:06 PM

## 2023-05-07 NOTE — Progress Notes (Signed)
 Progress Note   Patient: Anthony Ball ZOX:[id number] DOB: 03-Mar-1935 DOA: 04/12/2023     24 DOS: the patient was seen and examined on 05/07/2023   Brief hospital course: 88yo with h/o dementia, COPD, and hypothyroidism who was admitted on 1/23 with influenza A with PNA, completed treatment with Ceftriaxone/Azithromycin.  He was subsequently found to have SBO as well as R UPJ stone with mild hydronephrosis.   SBO has resolved and surgery signed off.     Assessment and Plan:  Placement issue Lived at home independently PTA Unable to return home due to dementia SNF declined by insurance company Family is arranging for private pay at [facility] [place] He is medically stable when a bed is available This has been the case since 2/5 Consider dc of patient on 2/18 if arrangements cannot be taken care of   Oral phase dysphagia SLP consulting Currently on dysphagia 2 (chopped) diet with nectar thick liquids   Severe malnutrition Nutrition Problem: Severe Malnutrition Etiology: chronic illness Signs/Symptoms: severe muscle depletion, severe fat depletion Interventions: Magic cup   Influenza with PNA Resolved Treated with 5 days of Ceftriaxone and Azithromycin   SBO Resolved Surgery has signed off   R UPJ Stone Mild hydronephrosis on presentation, resolved on repeat CT Continue doxazosin Urology consulted and recommends outpatient f/u   Dementia Delirium precautions Continue memantine, Seroquel Posey belt discontinued on 2/14   Hypothyroidism Continue Synthroid   COPD Continue Albuterol Will add Mucinex       Consultants: Surgery Urology PT OT Nutrition SLP TOC team   Procedures: None   Antibiotics: Ceftriaxone x 5 days Azithromycin x 5 days   30 Day Unplanned Readmission Risk Score    Flowsheet Row ED to Hosp-Admission (Current) from 04/12/2023 in [place] [facility]  30 Day Unplanned Readmission Risk Score (%) 18.89 Filed at 05/07/2023  0401       This score is the patient's risk of an unplanned readmission within 30 days of being discharged (0 -100%). The score is based on dignosis, age, lab data, medications, orders, and past utilization.   Low:  0-14.9   Medium: 15-21.9   High: 22-29.9   Extreme: 30 and above           Subjective: Resting comfortably.   Objective: Vitals:   05/06/23 1937 05/07/23 0522  BP: 126/67 111/64  Pulse: 91 67  Resp: 18 18  Temp: 99.2 F (37.3 C) (!) 97.4 F (36.3 C)  SpO2: 93% 93%    Intake/Output Summary (Last 24 hours) at 05/07/2023 1237 Last data filed at 05/07/2023 8119 Gross per 24 hour  Intake 300 ml  Output 101 ml  Net 199 ml   Filed Weights   04/13/23 1725 04/24/23 0900 05/07/23 0522  Weight: 56.2 kg 54.6 kg 36.1 kg    Exam:  General:  Appears calm and comfortable and is in NAD Eyes:  EOMI, normal lids, iris ENT:   hard of hearing, grossly normal lips & tongue, mmm Neck:  no LAD, masses or thyromegaly Cardiovascular:  RRR, no m/r/g. No LE edema.  Respiratory:   CTA bilaterally with no wheezes/rales/rhonchi.  Normal respiratory effort. Abdomen:  soft, NT, ND Skin:  no rash or induration seen on limited exam Musculoskeletal:  grossly normal tone BUE/BLE, good ROM, no bony abnormality Psychiatric:  sleeping comfortably today Neurologic:  unable to effectively perform  Data Reviewed: I have reviewed the patient's lab results since admission.  Pertinent labs for today include:  None today    Family Communication: None present  Disposition: Status is: Inpatient Remains inpatient appropriate because: needs placement     Time spent: 25 minutes  Unresulted Labs (From admission, onward)    None        Author: Jonah Blue, MD 05/07/2023 12:37 PM  For on call review [email or web address].

## 2023-05-08 DIAGNOSIS — J09X1 Influenza due to identified novel influenza A virus with pneumonia: Secondary | ICD-10-CM | POA: Diagnosis not present

## 2023-05-08 MED ORDER — TUBERCULIN PPD 5 UNIT/0.1ML ID SOLN
5.0000 [IU] | Freq: Once | INTRADERMAL | Status: AC
  Administered 2023-05-08: 5 [IU] via INTRADERMAL
  Filled 2023-05-08: qty 0.1
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
## 2023-05-08 NOTE — Progress Notes (Signed)
 Progress Note   Patient: Anthony Ball:[id number] DOB: 1934/05/30 DOA: 04/12/2023     25 DOS: the patient was seen and examined on 05/08/2023   Brief hospital course: 89yo with h/o dementia, COPD, and hypothyroidism who was admitted on 1/23 with influenza A with PNA, completed treatment with Ceftriaxone/Azithromycin.  He was subsequently found to have SBO as well as R UPJ stone with mild hydronephrosis.   SBO has resolved and surgery signed off.     Assessment and Plan:  Placement issue Lived at home independently PTA Unable to return home due to dementia SNF declined by insurance company Family is arranging for private pay at [facility] [place], although this is SNF and ALF/memory care may be more appropriate [facility] (no male beds currently available), and [facility] (awaiting call back) at the 2 options in [place] for memory care He is medically stable (since 2/5)  when a bed is available   Oral phase dysphagia SLP consulting Currently on dysphagia 2 (chopped) diet with nectar thick liquids   Severe malnutrition Nutrition Problem: Severe Malnutrition Etiology: chronic illness Signs/Symptoms: severe muscle depletion, severe fat depletion Interventions: Magic cup   Influenza with PNA Resolved Treated with 5 days of Ceftriaxone and Azithromycin No longer on precautions   SBO Resolved Surgery has signed off   R UPJ Stone Mild hydronephrosis on presentation, resolved on repeat CT Continue doxazosin Urology consulted and recommends outpatient f/u   Dementia Delirium precautions Continue memantine, Seroquel Posey belt discontinued on 2/14   Hypothyroidism Continue Synthroid   COPD Continue Albuterol Will add Mucinex       Consultants: Surgery Urology PT OT Nutrition SLP TOC team   Procedures: None   Antibiotics: Ceftriaxone x 5 days Azithromycin x 5 days  30 Day Unplanned Readmission Risk Score    Flowsheet Row ED to Hosp-Admission  (Current) from 04/12/2023 in [place] [facility]  30 Day Unplanned Readmission Risk Score (%) 15.7 Filed at 05/08/2023 0801       This score is the patient's risk of an unplanned readmission within 30 days of being discharged (0 -100%). The score is based on dignosis, age, lab data, medications, orders, and past utilization.   Low:  0-14.9   Medium: 15-21.9   High: 22-29.9   Extreme: 30 and above           Subjective: Feeling ok.  Thinks it has been a while since he last saw me.  Recognizes some MCI today.   Objective: Vitals:   05/07/23 1930 05/08/23 0608  BP: 128/68 115/63  Pulse: 84 70  Resp: 18 19  Temp: 99.9 F (37.7 C) (!) 97.4 F (36.3 C)  SpO2: 94% 94%    Intake/Output Summary (Last 24 hours) at 05/08/2023 1108 Last data filed at 05/07/2023 2141 Gross per 24 hour  Intake 240 ml  Output 430 ml  Net -190 ml   Filed Weights   04/24/23 0900 05/07/23 0522 05/08/23 0608  Weight: 54.6 kg 36.1 kg 39.4 kg    Exam:  General:  Appears calm and comfortable and is in NAD, sitting up in chair Eyes:  EOMI, normal lids, iris ENT:   hard of hearing, grossly normal lips & tongue, mmm Neck:  no LAD, masses or thyromegaly Cardiovascular:  RRR, no m/r/g. No LE edema.  Respiratory:   CTA bilaterally with no wheezes/rales/rhonchi.  Normal respiratory effort. Abdomen:  soft, NT, ND Skin:  no rash or induration seen on limited exam  Musculoskeletal:  grossly normal tone BUE/BLE, good ROM, no bony abnormality Psychiatric:  pleasant, mildly confused Neurologic:  no gross abnormalities on limited exam  Data Reviewed: I have reviewed the patient's lab results since admission.  Pertinent labs for today include:   None today     Family Communication: None present  Disposition: Status is: Inpatient Remains inpatient appropriate because: needs placement     Time spent: 25 minutes  Unresulted Labs (From admission, onward)     Start     Ordered   05/09/23 0500   CBC with Differential/Platelet  Tomorrow morning,   R       Question:  Specimen collection method  Answer:  Lab=Lab collect   05/08/23 1108   05/09/23 0500  Basic metabolic panel  Tomorrow morning,   R       Question:  Specimen collection method  Answer:  Lab=Lab collect   05/08/23 1108             Author: Jonah Blue, MD 05/08/2023 11:08 AM  For on call review [email or web address].

## 2023-05-08 NOTE — Plan of Care (Signed)

## 2023-05-08 NOTE — TOC Progression Note (Addendum)
 Transition of Care [facility]) - Progression Note    Patient Details  Name: Anthony Ball MRN: [id number] Date of Birth: [birth year]  [facility] CM/SW Contact  Carmesha Morocco A [place], LCSW Phone Number: 05/08/2023, 12:46 PM  Clinical Narrative:     Update 1647 CSW sent updated clinicals for review to [facility]. CSW to follow up tomorrow regarding bed acceptance decision. TB skin test request by facility, CSW notified provider.    CSW spoke with pt's sister Eber Jones. They are still working with [facility] but stated that they are also visiting [facility] regarding bed openings, especially if care is less expensive. They stated that they would share CSW information with facility so facility can reach out to CSW regarding possible placement.  CSW to reach out to facility and get information to send pt referral.    TOC will continue to follow.   Expected Discharge Plan: Skilled Nursing Facility Barriers to Discharge: SNF Pending bed offer  Expected Discharge Plan and Services In-house Referral: Clinical Social Work     Living arrangements for the past 2 months: Single Family Home Expected Discharge Date: 04/20/23                                     Social Determinants of Health (SDOH) Interventions SDOH Screenings   Food Insecurity: No Food Insecurity (04/13/2023)  Housing: Low Risk  (04/13/2023)  Transportation Needs: No Transportation Needs (04/13/2023)  Utilities: Not At Risk (04/13/2023)  Social Connections: Unknown (04/13/2023)  Tobacco Use: Unknown (04/13/2023)    Readmission Risk Interventions     No data to display

## 2023-05-08 NOTE — NC FL2 (Addendum)
 [place] MEDICAID FL2 LEVEL OF CARE FORM     IDENTIFICATION  Patient Name: Anthony Ball Birthdate: 1934/09/01 Sex: male Admission Date (Current Location): 04/12/2023  [facility] and [place] Number:  Producer, television/film/video and Address:  [facility], [address] [place], [place] [address]      Provider Number: [id number]  Attending Physician Name and Address:  Jonah Blue, MD  Relative Name and Phone Number:   Simmons,Carolyn (Sister) [phone]    Current Level of Care: Hospital Recommended Level of Care: Memory Care Prior Approval Number:    Date Approved/Denied:   PASRR Number: [id number]  Discharge Plan: Memory Care    Current Diagnoses: Patient Active Problem List   Diagnosis Date Noted   Kidney stones 05/02/2023   Protein-calorie malnutrition, severe 04/24/2023   Oral phase dysphagia 04/19/2023   COPD (chronic obstructive pulmonary disease) (HCC) 04/13/2023   Hypothyroidism 04/13/2023   Dementia (HCC) 04/13/2023   GERD (gastroesophageal reflux disease) 04/13/2023   Muscle pain 01/30/2023   Lumbar spondylosis 12/11/2022   Lumbar radiculopathy 10/27/2022    Orientation RESPIRATION BLADDER Height & Weight     Self, Place  Normal Incontinent Weight: 39.4 kg Height:  5\' 6"  (167.6 cm)  BEHAVIORAL SYMPTOMS/MOOD NEUROLOGICAL BOWEL NUTRITION STATUS      Continent Diet (See Discharge Summary)  AMBULATORY STATUS COMMUNICATION OF NEEDS Skin   Supervision Verbally Normal                       Personal Care Assistance Level of Assistance  Bathing, Feeding, Dressing Bathing Assistance: Limited assistance Feeding assistance: Limited assistance Dressing Assistance: Limited assistance     Functional Limitations Info  Sight, Hearing, Speech Sight Info: Adequate Hearing Info: Impaired Speech Info: Impaired    SPECIAL CARE FACTORS FREQUENCY  PT (By licensed PT), OT (By licensed OT)     PT Frequency: 3 x per week OT  Frequency: 3 x per week            Contractures Contractures Info: Not present    Additional Factors Info  Code Status, Allergies, Psychotropic Code Status Info: Full code Allergies Info: NKDA Psychotropic Info: Namenda, Seroquel         Current Medications (05/08/2023):  This is the current hospital active medication list Current Facility-Administered Medications  Medication Dose Route Frequency Provider Last Rate Last Admin   acetaminophen (TYLENOL) tablet 650 mg  650 mg Oral Q6H PRN Eduard Clos, MD   650 mg at 05/07/23 2123   Or   acetaminophen (TYLENOL) suppository 650 mg  650 mg Rectal Q6H PRN Eduard Clos, MD       doxazosin (CARDURA) tablet 1 mg  1 mg Oral Daily McKenzie, Mardene Celeste, MD   1 mg at 05/08/23 7846   guaiFENesin (ROBITUSSIN) 100 MG/5ML liquid 5 mL  5 mL Oral Q4H PRN Jonah Blue, MD       ipratropium-albuterol (DUONEB) 0.5-2.5 (3) MG/3ML nebulizer solution 3 mL  3 mL Nebulization Q4H PRN Carollee Herter, DO   3 mL at 04/19/23 1123   levothyroxine (SYNTHROID) tablet 25 mcg  25 mcg Oral QAC breakfast Pokhrel, Laxman, MD   25 mcg at 05/08/23 0617   memantine (NAMENDA) tablet 10 mg  10 mg Oral Daily Pokhrel, Laxman, MD   10 mg at 05/08/23 0851   multivitamin with minerals tablet 1 tablet  1 tablet Oral Daily Dorcas Carrow, MD   1 tablet at 05/08/23 [phone]  ondansetron (ZOFRAN) tablet 4 mg  4 mg Oral Q6H PRN Eduard Clos, MD       Or   ondansetron [facility]) injection 4 mg  4 mg Intravenous Q6H PRN Eduard Clos, MD       pantoprazole (PROTONIX) EC tablet 40 mg  40 mg Oral Daily Carollee Herter, DO   40 mg at 05/08/23 5409   QUEtiapine (SEROQUEL) tablet 25 mg  25 mg Oral BID Joycelyn Das, MD   25 mg at 05/08/23 8119     Discharge Medications: Please see discharge summary for a list of discharge medications.  Relevant Imaging Results:  Relevant Lab Results:   Additional Information SS# [id number]  Janae Bridgeman,  RN

## 2023-05-08 NOTE — Progress Notes (Signed)
 Mobility Specialist: Progress Note   05/08/23 1213  Mobility  Activity Ambulated with assistance in hallway  Level of Assistance Standby assist, set-up cues, supervision of patient - no hands on  Assistive Device Front wheel walker  Distance Ambulated (ft) 350 ft  Activity Response Tolerated well  Mobility Referral Yes  Mobility visit 1 Mobility  Mobility Specialist Start Time (ACUTE ONLY) 0830  Mobility Specialist Stop Time (ACUTE ONLY) 0844  Mobility Specialist Time Calculation (min) (ACUTE ONLY) 14 min    Received pt in bed having no complaints and agreeable to mobility. Pt was asymptomatic throughout ambulation and returned to room w/o fault. Left in chair w/ call bell in reach and all needs met. Chair alarm and restraint on.   Maurene Capes Mobility Specialist Please contact via SecureChat or Rehab office at [phone]

## 2023-05-09 DIAGNOSIS — J09X1 Influenza due to identified novel influenza A virus with pneumonia: Secondary | ICD-10-CM | POA: Diagnosis not present

## 2023-05-09 LAB — CBC WITH DIFFERENTIAL/PLATELET
Abs Immature Granulocytes: 0.06 10*3/uL (ref 0.00–0.07)
Basophils Absolute: 0.1 10*3/uL (ref 0.0–0.1)
Basophils Relative: 1 %
Eosinophils Absolute: 1 10*3/uL — ABNORMAL HIGH (ref 0.0–0.5)
Eosinophils Relative: 14 %
HCT: 41.5 % (ref 39.0–52.0)
Hemoglobin: 13.3 g/dL (ref 13.0–17.0)
Immature Granulocytes: 1 %
Lymphocytes Relative: 20 %
Lymphs Abs: 1.4 10*3/uL (ref 0.7–4.0)
MCH: 28.4 pg (ref 26.0–34.0)
MCHC: 32 g/dL (ref 30.0–36.0)
MCV: 88.5 fL (ref 80.0–100.0)
Monocytes Absolute: 0.5 10*3/uL (ref 0.1–1.0)
Monocytes Relative: 8 %
Neutro Abs: 3.8 10*3/uL (ref 1.7–7.7)
Neutrophils Relative %: 56 %
Platelets: 376 10*3/uL (ref 150–400)
RBC: 4.69 MIL/uL (ref 4.22–5.81)
RDW: 15.2 % (ref 11.5–15.5)
WBC: 6.8 10*3/uL (ref 4.0–10.5)
nRBC: 0 % (ref 0.0–0.2)

## 2023-05-09 LAB — BASIC METABOLIC PANEL
Anion gap: 10 (ref 5–15)
BUN: 20 mg/dL (ref 8–23)
CO2: 24 mmol/L (ref 22–32)
Calcium: 8.9 mg/dL (ref 8.9–10.3)
Chloride: 104 mmol/L (ref 98–111)
Creatinine, Ser: 0.94 mg/dL (ref 0.61–1.24)
GFR, Estimated: >60 mL/min (ref >60)
Glucose, Bld: 90 mg/dL (ref 70–99)
Potassium: 4.1 mmol/L (ref 3.5–5.1)
Sodium: 138 mmol/L (ref 135–145)

## 2023-05-09 NOTE — TOC Progression Note (Signed)
 Transition of Care [facility]) - Progression Note    Patient Details  Name: DMETRIUS AMBS MRN: [id number] Date of Birth: 23-Dec-1934  Transition of Care [facility]) CM/SW Contact  Abdulrahim Siddiqi A [place], LCSW Phone Number: 05/09/2023, 4:14 PM  Clinical Narrative:     CSW received contact from [facility] regarding pt's possible admission. Stated they would meet with pt in person for visit if weather permits. CSW to reach out in the AM to update on weather stability for visitation. In event cannot visit, CSW to coordinate another time with facility director.    TOC will continue to follow.   Expected Discharge Plan: Skilled Nursing Facility Barriers to Discharge: SNF Pending bed offer  Expected Discharge Plan and Services In-house Referral: Clinical Social Work     Living arrangements for the past 2 months: Single Family Home Expected Discharge Date: 04/20/23                                     Social Determinants of Health (SDOH) Interventions SDOH Screenings   Food Insecurity: No Food Insecurity (04/13/2023)  Housing: Low Risk  (04/13/2023)  Transportation Needs: No Transportation Needs (04/13/2023)  Utilities: Not At Risk (04/13/2023)  Social Connections: Unknown (04/13/2023)  Tobacco Use: Unknown (04/13/2023)    Readmission Risk Interventions     No data to display

## 2023-05-09 NOTE — Progress Notes (Signed)
 Occupational Therapy Treatment Patient Details Name: Anthony Ball MRN: [id number] DOB: [birth year] Today's Date: 05/09/2023   History of present illness Anthony Ball is a 89 y.o. male  patient to hospital with shortness of breath and productive cough; influenza a with pneumonia, partial SBO with history of COPD, dementia, hypothyroidism   OT comments  Pt at this time presented in the bathroom. Pt at this time then completed sponge bath at the sink in standing with CGA and needed supervision with safety as he attempted to use the purple sanitizer wipes in session. He then was set up for meal as did not attempt to start self feeding at all once in a sitting position with food present. Then transitioned to NT to provide supervision for meal. Patient will benefit from continued inpatient follow up therapy, <3 hours/day.      If plan is discharge home, recommend the following:  A little help with walking and/or transfers;A little help with bathing/dressing/bathroom;Assistance with cooking/housework;Assistance with feeding;Direct supervision/assist for medications management;Direct supervision/assist for financial management;Assist for transportation;Help with stairs or ramp for entrance;Supervision due to cognitive status   Equipment Recommendations  None recommended by OT    Recommendations for Other Services      Precautions / Restrictions Precautions Precautions: Fall Precaution/Restrictions Comments: O2 sats Restrictions Weight Bearing Restrictions Per Provider Order: No       Mobility Bed Mobility               General bed mobility comments: Pt presented in the bathroom    Transfers Overall transfer level: Needs assistance Equipment used: Rolling walker (2 wheels), None Transfers: Sit to/from Stand Sit to Stand: Supervision                 Balance Overall balance assessment: Needs assistance Sitting-balance support: Feet supported Sitting balance-Leahy Scale:  Good     Standing balance support: Bilateral upper extremity supported, No upper extremity supported Standing balance-Leahy Scale: Fair Standing balance comment: Pt does better with wlaker and needs only supervision but in small spaces has decrease in ability to navigate walker                           ADL either performed or assessed with clinical judgement   ADL Overall ADL's : Needs assistance/impaired Eating/Feeding: Set up;Sitting   Grooming: Wash/dry hands;Wash/dry face;Contact guard assist   Upper Body Bathing: Contact guard assist;Standing   Lower Body Bathing: Contact guard assist;Sit to/from stand   Upper Body Dressing : Contact guard assist;Standing   Lower Body Dressing: Contact guard assist;Cueing for safety;Cueing for sequencing;Sit to/from stand   Toilet Transfer: Contact guard assist;Cueing for safety;Cueing for sequencing   Toileting- Clothing Manipulation and Hygiene: Contact guard assist;Sit to/from stand       Functional mobility during ADLs: Contact guard assist;Cueing for sequencing;Cueing for safety;Rolling walker (2 wheels)      Extremity/Trunk Assessment Upper Extremity Assessment Upper Extremity Assessment: Generalized weakness   Lower Extremity Assessment Lower Extremity Assessment: Defer to PT evaluation        Vision   Vision Assessment?: No apparent visual deficits   Perception Perception Perception: Not tested   Praxis Praxis Praxis: Not tested   Communication Communication Communication: Impaired Factors Affecting Communication: Hearing impaired   Cognition Arousal: Alert Behavior During Therapy: Flat affect Cognition: Cognition impaired   Orientation impairments: Situation   Memory impairment (select all impairments): Short-term memory, Working [employer] fast streamer, Engineer, structural memory Attention impairment (select first  level of impairment): Sustained attention Executive functioning impairment (select all  impairments): Sequencing, Problem solving, Reasoning, Organization OT - Cognition Comments: Pt attempted to use prurple santizing wipes for hygiene                 Following commands: Impaired Following commands impaired: Only follows one step commands consistently, Follows one step commands with increased time      Cueing   Cueing Techniques: Verbal cues, Tactile cues, Gestural cues  Exercises      Shoulder Instructions       General Comments      Pertinent Vitals/ Pain       Pain Assessment Pain Assessment: No/denies pain Faces Pain Scale: No hurt  Home Living                                          Prior Functioning/Environment              Frequency  Min 1X/week        Progress Toward Goals  OT Goals(current goals can now be found in the care plan section)  Progress towards OT goals: Progressing toward goals  Acute Rehab OT Goals Patient Stated Goal: none OT Goal Formulation: With patient Time For Goal Achievement: 05/23/23 Potential to Achieve Goals: Good ADL Goals Pt Will Perform Upper Body Dressing: with modified independence;sitting Pt Will Perform Lower Body Dressing: with modified independence;sit to/from stand Pt Will Transfer to Toilet: with supervision;ambulating Additional ADL Goal #1: pt will follow 2 step command with min cues in prep for ADLs  Plan      Co-evaluation                 AM-PAC OT "6 Clicks" Daily Activity     Outcome Measure   Help from another person eating meals?: A Little Help from another person taking care of personal grooming?: A Little Help from another person toileting, which includes using toliet, bedpan, or urinal?: A Little Help from another person bathing (including washing, rinsing, drying)?: A Little Help from another person to put on and taking off regular upper body clothing?: A Little Help from another person to put on and taking off regular lower body clothing?: A  Little 6 Click Score: 18    End of Session Equipment Utilized During Treatment: Gait belt  OT Visit Diagnosis: Unsteadiness on feet (R26.81);Other abnormalities of gait and mobility (R26.89);Muscle weakness (generalized) (M62.81)   Activity Tolerance Patient tolerated treatment well   Patient Left in chair;with call bell/phone within reach;with chair alarm set;with family/visitor present (NT to finish meal)   Nurse Communication Mobility status        Time: 8295-6213 OT Time Calculation (min): 41 min  Charges: OT General Charges $OT Visit: 1 Visit OT Treatments $Self Care/Home Management : 23-37 mins  Presley Raddle OTR/L  Acute Rehab Services  [phone] office number   Alphia Moh 05/09/2023, 10:35 AM

## 2023-05-09 NOTE — Progress Notes (Signed)
 Mobility Specialist: Progress Note   05/09/23 1203  Mobility  Activity Ambulated with assistance in hallway  Level of Assistance Standby assist, set-up cues, supervision of patient - no hands on  Assistive Device Front wheel walker  Distance Ambulated (ft) 300 ft  Activity Response Tolerated well  Mobility Referral Yes  Mobility visit 1 Mobility  Mobility Specialist Start Time (ACUTE ONLY) 1145  Mobility Specialist Stop Time (ACUTE ONLY) 1155  Mobility Specialist Time Calculation (min) (ACUTE ONLY) 10 min    Pt was agreeable to mobility session - received in bed. SV throughout with no complaints. Returned to room without fault. Left in bed with all needs met, call bell in reach.   Maurene Capes Mobility Specialist Please contact via SecureChat or Rehab office at [phone]

## 2023-05-09 NOTE — Progress Notes (Signed)
 TB test initiated 2/19 at 1742 by this RN. To be read 2/20 at 1642.

## 2023-05-09 NOTE — Progress Notes (Signed)
 PROGRESS NOTE  TOBBY FAWCETT  DOB: [birth year]  PCP: Georgann Housekeeper, MD ZOX:[id number]  DOA: 04/12/2023  LOS: 26 days  Hospital Day: 28  Brief narrative: Anthony Ball is a 89 y.o. male with PMH significant for COPD, GERD, hypothyroidism  1/23, patient was admitted for influenza A with pneumonia, completed treatment with ceftriaxone, azithromycin. Subsequently found to have SBO as well as right UVJ stone with mild hydronephrosis. SBO resolved, surgery signed off  Currently pending placement.  Subjective: Patient was seen and examined this morning.  Not in distress.  Alert, awake, knows in the hospital.  No family at bedside In the last 24 hours, afebrile, hemodynamically stable. Labs this morning with unremarkable CBC, BMP  Assessment and plan: Difficult placement Previously lived at home independently  Unable to return home due to dementia SNF declined by insurance company Family is arranging for private pay at [facility], although this is SNF and ALF/memory care may be more appropriate [facility] (no male beds currently available), and [facility] (awaiting call back) at the 2 options in [place] for memory care He is medically stable (since 2/5)  when a bed is available   Oral phase dysphagia SLP consulted Currently on dysphagia 2 (chopped) diet with nectar thick liquids   Severe malnutrition Nutrition Problem: Severe Malnutrition Etiology: chronic illness Signs/Symptoms: severe muscle depletion, severe fat depletion Interventions: Magic cup   Influenza with PNA Resolved Treated with 5 days of Ceftriaxone and Azithromycin No longer on precautions   SBO Resolved Surgery has signed off   R UPJ Stone Mild hydronephrosis on presentation, resolved on repeat CT Continue doxazosin Urology recommended outpatient f/u As symptomatic currently   Dementia Delirium precautions Continue memantine, Seroquel Posey belt discontinued on 2/14    Hypothyroidism Continue Synthroid   COPD Continue Albuterol Will add Mucinex   Mobility: Encourage ambulation  Goals of care   Code Status: Full Code     DVT prophylaxis:  SCDs Start: 04/13/23 0540   Antimicrobials: None Fluid: None Consultants: None currently Family Communication: Not at bedside  Status: Inpatient Level of care:  Med-Surg   Patient is from: Home Needs to continue in-hospital care: Difficult to place     Diet:  Diet Order             DIET DYS 2 Room service appropriate? No; Fluid consistency: Thin  Diet effective now           DIET DYS 2                   Scheduled Meds:  doxazosin  1 mg Oral Daily   levothyroxine  25 mcg Oral QAC breakfast   memantine  10 mg Oral Daily   multivitamin with minerals  1 tablet Oral Daily   pantoprazole  40 mg Oral Daily   QUEtiapine  25 mg Oral BID   tuberculin  5 Units Intradermal Once    PRN meds: acetaminophen **OR** acetaminophen, guaiFENesin, ipratropium-albuterol, ondansetron **OR** ondansetron (ZOFRAN) IV   Infusions:    Antimicrobials: Anti-infectives (From admission, onward)    Start     Dose/Rate Route Frequency Ordered Stop   04/14/23 0000  azithromycin (ZITHROMAX) 500 mg in sodium chloride 0.9 % 250 mL IVPB        500 mg 250 mL/hr over 60 Minutes Intravenous Every 24 hours 04/13/23 0541 04/18/23 0110   04/13/23 2200  cefTRIAXone (ROCEPHIN) 2 g in sodium chloride 0.9 % 100 mL IVPB  2 g 200 mL/hr over 30 Minutes Intravenous Every 24 hours 04/13/23 0541 04/17/23 2317   04/12/23 2345  cefTRIAXone (ROCEPHIN) 1 g in sodium chloride 0.9 % 100 mL IVPB        1 g 200 mL/hr over 30 Minutes Intravenous  Once 04/12/23 2337 04/13/23 0034   04/12/23 2345  azithromycin (ZITHROMAX) 500 mg in sodium chloride 0.9 % 250 mL IVPB        500 mg 250 mL/hr over 60 Minutes Intravenous  Once 04/12/23 2337 04/13/23 0102       Objective: Vitals:   05/08/23 2059 05/09/23 0520  BP: 125/79  127/79  Pulse: 82 74  Resp: 18 18  Temp: 98 F (36.7 C) 97.7 F (36.5 C)  SpO2: 94% 94%    Intake/Output Summary (Last 24 hours) at 05/09/2023 1030 Last data filed at 05/09/2023 0939 Gross per 24 hour  Intake 100 ml  Output 400 ml  Net -300 ml   Filed Weights   04/24/23 0900 05/07/23 0522 05/08/23 0608  Weight: 54.6 kg 36.1 kg 39.4 kg   Weight change:  Body mass index is 14.02 kg/m.   Physical Exam: General exam: Pleasant, [name].  Not in distress Skin: No rashes, lesions or ulcers. HEENT: Atraumatic, normocephalic, no obvious bleeding Lungs: Clear to auscultation bilaterally,  CVS: S1, S2, no murmur,   GI/Abd: Soft, nontender, nondistended, bowel sound present,   CNS: Alert, awake, oriented to place only Psychiatry: Sad affect Extremities: No pedal edema, no calf tenderness,   Data Review: I have personally reviewed the laboratory data and studies available.  F/u labs  Unresulted Labs (From admission, onward)    None       Total time spent in review of labs and imaging, patient evaluation, formulation of plan, documentation and communication with family: 25 minutes  Signed, Lorin Glass, MD [facility] 05/09/2023

## 2023-05-09 NOTE — Progress Notes (Signed)
 This RN received a phone call from family stating that Sharyon Cable will be coming to evaluate the patient tomorrow 2/20 from [facility]. CM/SW aware.

## 2023-05-10 DIAGNOSIS — J09X1 Influenza due to identified novel influenza A virus with pneumonia: Secondary | ICD-10-CM | POA: Diagnosis not present

## 2023-05-10 MED ORDER — ENSURE ENLIVE PO LIQD
237.0000 mL | Freq: Two times a day (BID) | ORAL | Status: DC
  Administered 2023-05-10 – 2023-05-14 (×8): 237 mL via ORAL

## 2023-05-10 NOTE — Progress Notes (Signed)
 Nutrition Follow-up  DOCUMENTATION CODES:   Severe malnutrition in context of chronic illness  INTERVENTION:  - DYS 2 diet with nectar thickened liquids per SLP. - Magic cup TID with meals, each supplement provides 290 kcal and 9 grams of protein - Mighty Shake TID with meals, each supplement provides 330 kcals and 9 grams of protein - B6 and zinc labs checked: both WNL - Continue MVI with minerals daily - Monitor weight trends.  -Ensure Plus High Protein po BID, each supplement provides 350 kcal and 20 grams of protein.    NUTRITION DIAGNOSIS:   Severe Malnutrition related to chronic illness as evidenced by severe muscle depletion, severe fat depletion.    GOAL:   Patient will meet greater than or equal to 90% of their needs    MONITOR:   PO intake, Weight trends, Supplement acceptance  REASON FOR ASSESSMENT:   LOS    ASSESSMENT:   Pt presented to ER with shortness of breath, productive cough, nausea/vomiting, and found to have partial SBO, pneumonia, renal stone, and influenza. Pt with PMH of dementia, COPD, hypothyroidism. Patient very confused at time of visit. He reports that he has had a good appetite, but did not recall as to how much he was eating or if he was eating his magic ups or mighty shakes.  Patient noted to have weight loss during hospital stay although unable to determine if accurate weight loss. Due to patient being poor historian. Will add Ensure to patient nutr poc, to possibly help meet needs and promote weight stability.    Admit weight: 61 kg Hospital weight history:  05/08/23 0608 39.4 kg 86.86 lbs  05/07/23 05:22:14 36.1 kg 79.59 lbs  04/24/23 0900 54.6 kg 120.37 lbs  04/13/23 17:25:04 56.2 kg 123.9 lbs  04/12/23 1757 61 kg 134.48 lbs      Average Meal Intake: 75-100: 78% intake x 8 recorded meals  Nutritionally Relevant Medications: Scheduled Meds:  doxazosin  1 mg Oral Daily   memantine  10 mg Oral Daily   multivitamin with  minerals  1 tablet Oral Daily   QUEtiapine  25 mg Oral BID   Labs Reviewed    NUTRITION - FOCUSED PHYSICAL EXAM:  Flowsheet Row Most Recent Value  Orbital Region Severe depletion  Upper Arm Region Severe depletion  Thoracic and Lumbar Region Severe depletion  Buccal Region Moderate depletion  Temple Region Severe depletion  Clavicle Bone Region Severe depletion  Clavicle and Acromion Bone Region Severe depletion  Scapular Bone Region Severe depletion  Dorsal Hand Severe depletion  Patellar Region Severe depletion  Anterior Thigh Region Severe depletion  Posterior Calf Region Severe depletion  Edema (RD Assessment) None  Hair Reviewed  Eyes Reviewed  Mouth Reviewed  [seborrheic dermititis]  Skin Reviewed  Nails Reviewed       Diet Order:   Diet Order             DIET DYS 2 Room service appropriate? No; Fluid consistency: Thin  Diet effective now           DIET DYS 2                   EDUCATION NEEDS:   Education needs have been addressed  Skin:  Skin Assessment: Reviewed RN Assessment  Last BM:  2/11  Height:   Ht Readings from Last 1 Encounters:  04/24/23 5\' 6"  (1.676 m)    Weight:   Wt Readings from Last 1 Encounters:  05/08/23 39.4 kg  Ideal Body Weight:  64.5 kg  BMI:  Body mass index is 14.02 kg/m.  Estimated Nutritional Needs:   Kcal:  1500-1700  Protein:  80-95 g  Fluid:  1.5-1.7 L    Jamelle Haring RDN, LDN Clinical Dietitian   If unable to reach, please contact "RD Inpatient" secure chat group between 8 am-4 pm daily"

## 2023-05-10 NOTE — Plan of Care (Signed)
  Problem: Activity: Goal: Risk for activity intolerance will decrease Outcome: Progressing   Problem: Nutrition: Goal: Adequate nutrition will be maintained Outcome: Progressing   Problem: Elimination: Goal: Will not experience complications related to bowel motility Outcome: Progressing   Problem: Pain Managment: Goal: General experience of comfort will improve and/or be controlled Outcome: Progressing

## 2023-05-10 NOTE — Progress Notes (Signed)
 Mobility Specialist: Progress Note   05/10/23 1647  Mobility  Activity Transferred from chair to bed  Level of Assistance Contact guard assist, steadying assist  Assistive Device None  Activity Response Tolerated well  Mobility Referral Yes  Mobility visit 1 Mobility  Mobility Specialist Start Time (ACUTE ONLY) 1515  Mobility Specialist Stop Time (ACUTE ONLY) 1520  Mobility Specialist Time Calculation (min) (ACUTE ONLY) 5 min    Returned pt back to bed - received in chair. No complaints. CG without AD for stand pivot transfer. Left in bed with all needs met, call bell in reach. Bed alarm on.   Maurene Capes Mobility Specialist Please contact via SecureChat or Rehab office at [phone]

## 2023-05-10 NOTE — Progress Notes (Signed)
 Mobility Specialist: Progress Note   05/10/23 1646  Mobility  Activity Ambulated with assistance in hallway  Level of Assistance Standby assist, set-up cues, supervision of patient - no hands on  Assistive Device Front wheel walker  Distance Ambulated (ft) 400 ft  Activity Response Tolerated well  Mobility Referral Yes  Mobility visit 1 Mobility  Mobility Specialist Start Time (ACUTE ONLY) 1220  Mobility Specialist Stop Time (ACUTE ONLY) 1235  Mobility Specialist Time Calculation (min) (ACUTE ONLY) 15 min    Pt was agreeable to mobility session - received in bed. No complaints. SV throughout. Returned to room without fault. Left in chair with all needs met, call bell in reach. Chair alarm on.   Maurene Capes Mobility Specialist Please contact via SecureChat or Rehab office at [phone]

## 2023-05-10 NOTE — Progress Notes (Signed)
 Physical Therapy Treatment Patient Details Name: Anthony Ball MRN: [id number] DOB: Jul 15, 1934 Today's Date: 05/10/2023   History of Present Illness Anthony Ball is a 89 y.o. male  patient to hospital with shortness of breath and productive cough; influenza a with pneumonia, partial SBO with history of COPD, dementia, hypothyroidism    PT Comments  Patient very cooperative and completed ambulation and LE exercises as requested. While walking, he drifts mildly to his left and "grazed" 6 objects on his left with little to no awareness. With cues to avoid an upcoming obstacle he is able to avoid it. LE strengthening exercises completed.     If plan is discharge home, recommend the following: A little help with walking and/or transfers;A little help with bathing/dressing/bathroom;Assistance with cooking/housework;Direct supervision/assist for financial management;Assist for transportation;Help with stairs or ramp for entrance;Direct supervision/assist for medications management;Supervision due to cognitive status   Can travel by private vehicle     Yes  Equipment Recommendations  Rolling walker (2 wheels);BSC/3in1    Recommendations for Other Services       Precautions / Restrictions Precautions Precautions: Fall Restrictions Weight Bearing Restrictions Per Provider Order: No     Mobility  Bed Mobility               General bed mobility comments: up in recliner    Transfers Overall transfer level: Needs assistance Equipment used: Rolling walker (2 wheels), None Transfers: Sit to/from Stand Sit to Stand: Supervision           General transfer comment: proper sequencing with RW with repeated transfers    Ambulation/Gait Ambulation/Gait assistance: Contact guard assist Gait Distance (Feet): 280 Feet Assistive device: None, Rolling walker (2 wheels) Gait Pattern/deviations: Decreased step length - right, Decreased dorsiflexion - right, Decreased stride length,  Decreased step length - left, Decreased dorsiflexion - left, Shuffle Gait velocity: decreased Gait velocity interpretation: 1.31 - 2.62 ft/sec, indicative of limited community ambulator   General Gait Details: vc initially for proximity to RW with pt then maintaining correct position; vc for upright posture; pt drifting to his left with bumping into objects x 6   Stairs             Wheelchair Mobility     Tilt Bed    Modified Rankin (Stroke Patients Only)       Balance Overall balance assessment: Needs assistance Sitting-balance support: Feet supported Sitting balance-Leahy Scale: Good Sitting balance - Comments: sitting EOB   Standing balance support: No upper extremity supported Standing balance-Leahy Scale: Fair                              Hotel manager: Impaired Factors Affecting Communication: Hearing impaired  Cognition Arousal: Alert Behavior During Therapy: Flat affect   PT - Cognitive impairments: No family/caregiver present to determine baseline, Orientation, Memory, Awareness, Attention, Initiation, Safety/Judgement, Sequencing, Problem solving                         Following commands: Impaired Following commands impaired: Only follows one step commands consistently, Follows one step commands with increased time    Cueing Cueing Techniques: Verbal cues, Tactile cues, Gestural cues  Exercises General Exercises - Lower Extremity Ankle Circles/Pumps: AROM, Both, 10 reps, Seated Long Arc Quad: AROM, Both, 10 reps, Seated Other Exercises Other Exercises: Pt performed 5x STS from recliner without BUE support with CGA<>min assist with focus on BLE  strengthening & overall endurance training.    General Comments        Pertinent Vitals/Pain Pain Assessment Pain Assessment: No/denies pain    Home Living                          Prior Function            PT Goals (current goals can  now be found in the care plan section) Acute Rehab PT Goals Patient Stated Goal: keep working on getting stronger Time For Goal Achievement: 05/13/23 Potential to Achieve Goals: Good Progress towards PT goals: Progressing toward goals    Frequency    Min 1X/week      PT Plan      Co-evaluation              AM-PAC PT "6 Clicks" Mobility   Outcome Measure  Help needed turning from your back to your side while in a flat bed without using bedrails?: None Help needed moving from lying on your back to sitting on the side of a flat bed without using bedrails?: None Help needed moving to and from a bed to a chair (including a wheelchair)?: A Little Help needed standing up from a chair using your arms (e.g., wheelchair or bedside chair)?: A Little Help needed to walk in hospital room?: A Little Help needed climbing 3-5 steps with a railing? : A Little 6 Click Score: 20    End of Session Equipment Utilized During Treatment: Gait belt Activity Tolerance: Patient tolerated treatment well Patient left: in chair;with chair alarm set;with call bell/phone within reach (belt alarm set (was on pt when PT entered room)) Nurse Communication: Mobility status PT Visit Diagnosis: Unsteadiness on feet (R26.81);Other abnormalities of gait and mobility (R26.89);Muscle weakness (generalized) (M62.81)     Time: 2841-3244 PT Time Calculation (min) (ACUTE ONLY): 16 min  Charges:    $Gait Training: 8-22 mins PT General Charges $$ ACUTE PT VISIT: 1 Visit                      Anthony Ball, PT Acute Rehabilitation Services  Office [phone]    Zena Amos 05/10/2023, 2:00 PM

## 2023-05-10 NOTE — Progress Notes (Signed)
 PROGRESS NOTE  ARYEH BUTTERFIELD  DOB: September 09, 1934  PCP: Georgann Housekeeper, MD [id number]  DOA: 04/12/2023  LOS: 27 days  Hospital Day: 29  Brief narrative: Anthony Ball is a 89 y.o. male with PMH significant for COPD, GERD, hypothyroidism  1/23, patient was admitted for influenza A with pneumonia, completed treatment with ceftriaxone, azithromycin. Subsequently found to have SBO as well as right UVJ stone with mild hydronephrosis. SBO resolved, surgery signed off  Currently pending placement.  Subjective: Patient was seen and examined this morning.   Not in distress Knows that he is in the hospital. No family bedside  Assessment and plan: Difficult placement Previously lived at home independently  Unable to return home due to dementia SNF declined by insurance company Family is arranging for private pay at [facility] of [place], although this is SNF and ALF/memory care may be more appropriate [facility] (no male beds currently available), and [facility] (awaiting call back) at the 2 options in [place] for memory care He is medically stable (since 2/5)  when a bed is available   Oral phase dysphagia SLP consulted Currently on dysphagia 2 (chopped) diet with nectar thick liquids   Severe malnutrition Nutrition Problem: Severe Malnutrition Etiology: chronic illness Signs/Symptoms: severe muscle depletion, severe fat depletion Interventions: Magic cup   Influenza with PNA Resolved Treated with 5 days of Ceftriaxone and Azithromycin No longer on precautions   SBO Resolved Surgery has signed off   R UPJ Stone Mild hydronephrosis on presentation, resolved on repeat CT Continue doxazosin Urology recommended outpatient f/u As symptomatic currently   Dementia Delirium precautions Continue memantine, Seroquel Posey belt discontinued on 2/14   Hypothyroidism Continue Synthroid   COPD Continue Albuterol Will add Mucinex   Mobility: Encourage  ambulation  Goals of care   Code Status: Full Code     DVT prophylaxis:  SCDs Start: 04/13/23 0540   Antimicrobials: None Fluid: None Consultants: None currently Family Communication: Not at bedside  Status: Inpatient Level of care:  Med-Surg   Patient is from: Home Needs to continue in-hospital care: Difficult to place.  No essential change in last 24 hours     Diet:  Diet Order             DIET DYS 2 Room service appropriate? No; Fluid consistency: Thin  Diet effective now           DIET DYS 2                   Scheduled Meds:  doxazosin  1 mg Oral Daily   feeding supplement  237 mL Oral BID BM   levothyroxine  25 mcg Oral QAC breakfast   memantine  10 mg Oral Daily   multivitamin with minerals  1 tablet Oral Daily   pantoprazole  40 mg Oral Daily   QUEtiapine  25 mg Oral BID   tuberculin  5 Units Intradermal Once    PRN meds: acetaminophen **OR** acetaminophen, guaiFENesin, ipratropium-albuterol, ondansetron **OR** ondansetron (ZOFRAN) IV   Infusions:    Antimicrobials: Anti-infectives (From admission, onward)    Start     Dose/Rate Route Frequency Ordered Stop   04/14/23 0000  azithromycin (ZITHROMAX) 500 mg in sodium chloride 0.9 % 250 mL IVPB        500 mg 250 mL/hr over 60 Minutes Intravenous Every 24 hours 04/13/23 0541 04/18/23 0110   04/13/23 2200  cefTRIAXone (ROCEPHIN) 2 g in sodium chloride 0.9 % 100 mL IVPB  2 g 200 mL/hr over 30 Minutes Intravenous Every 24 hours 04/13/23 0541 04/17/23 2317   04/12/23 2345  cefTRIAXone (ROCEPHIN) 1 g in sodium chloride 0.9 % 100 mL IVPB        1 g 200 mL/hr over 30 Minutes Intravenous  Once 04/12/23 2337 04/13/23 0034   04/12/23 2345  azithromycin (ZITHROMAX) 500 mg in sodium chloride 0.9 % 250 mL IVPB        500 mg 250 mL/hr over 60 Minutes Intravenous  Once 04/12/23 2337 04/13/23 0102       Objective: Vitals:   05/10/23 0810 05/10/23 1047  BP: 120/70   Pulse: 69   Resp: 18 16   Temp: 97.8 F (36.6 C)   SpO2: 93% 98%    Intake/Output Summary (Last 24 hours) at 05/10/2023 1429 Last data filed at 05/10/2023 1314 Gross per 24 hour  Intake 477 ml  Output --  Net 477 ml   Filed Weights   04/24/23 0900 05/07/23 0522 05/08/23 0608  Weight: 54.6 kg 36.1 kg 39.4 kg   Weight change:  Body mass index is 14.02 kg/m.   Physical Exam: General exam: Pleasant, [name].  Not in distress Skin: No rashes, lesions or ulcers. HEENT: Atraumatic, normocephalic, no obvious bleeding Lungs: Clear to auscultation bilaterally,  CVS: S1, S2, no murmur,   GI/Abd: Soft, nontender, nondistended, bowel sound present,   CNS: Alert, awake, oriented to place only Psychiatry: Sad affect Extremities: No pedal edema, no calf tenderness,   Data Review: I have personally reviewed the laboratory data and studies available.  F/u labs  Unresulted Labs (From admission, onward)    None       Total time spent in review of labs and imaging, patient evaluation, formulation of plan, documentation and communication with family: 25 minutes  Signed, Lorin Glass, MD [facility] 05/10/2023

## 2023-05-10 NOTE — Plan of Care (Signed)

## 2023-05-11 ENCOUNTER — Ambulatory Visit: Payer: Medicare Other | Admitting: Urology

## 2023-05-11 DIAGNOSIS — J09X1 Influenza due to identified novel influenza A virus with pneumonia: Secondary | ICD-10-CM | POA: Diagnosis not present

## 2023-05-11 DIAGNOSIS — N1330 Unspecified hydronephrosis: Secondary | ICD-10-CM

## 2023-05-11 DIAGNOSIS — N201 Calculus of ureter: Secondary | ICD-10-CM

## 2023-05-11 DIAGNOSIS — F0390 Unspecified dementia without behavioral disturbance: Secondary | ICD-10-CM

## 2023-05-11 DIAGNOSIS — N200 Calculus of kidney: Secondary | ICD-10-CM

## 2023-05-11 NOTE — Progress Notes (Signed)
 Mobility Specialist: Progress Note   05/11/23 1120  Mobility  Activity Ambulated with assistance in hallway  Level of Assistance Standby assist, set-up cues, supervision of patient - no hands on  Assistive Device Front wheel walker  Distance Ambulated (ft) 500 ft  Activity Response Tolerated well  Mobility Referral Yes  Mobility visit 1 Mobility  Mobility Specialist Start Time (ACUTE ONLY) [id number]  Mobility Specialist Stop Time (ACUTE ONLY) 0850  Mobility Specialist Time Calculation (min) (ACUTE ONLY) 15 min    Received pt in bed having no complaints and agreeable to mobility. Pt was asymptomatic throughout ambulation and returned to room w/o fault. Left in chair w/ call bell in reach and all needs met.   Maurene Capes Mobility Specialist Please contact via SecureChat or Rehab office at [phone]

## 2023-05-11 NOTE — Plan of Care (Signed)
   Problem: Activity: Goal: Risk for activity intolerance will decrease Outcome: Progressing   Problem: Nutrition: Goal: Adequate nutrition will be maintained Outcome: Progressing   Problem: Coping: Goal: Level of anxiety will decrease Outcome: Progressing   Problem: Elimination: Goal: Will not experience complications related to bowel motility Outcome: Progressing

## 2023-05-11 NOTE — Progress Notes (Signed)
 PROGRESS NOTE  Anthony Ball  DOB: [birth year]  PCP: Georgann Housekeeper, MD [id number]  DOA: 04/12/2023  LOS: 28 days  Hospital Day: 30  Brief narrative: Anthony Ball is a 89 y.o. male with PMH significant for COPD, GERD, hypothyroidism  1/23, patient was admitted for influenza A with pneumonia, completed treatment with ceftriaxone, azithromycin. Subsequently found to have SBO as well as right UVJ stone with mild hydronephrosis. SBO resolved, surgery signed off  Currently pending placement.  Subjective: Patient was seen and examined this morning.   [name].  Sitting up in recliner.  Sister Bonita Quin at bedside.  Assessment and plan: Difficult placement Previously lived at home independently  Unable to return home due to dementia SNF declined by insurance company Family is arranging for private pay at [facility] [place], although this is SNF and ALF/memory care may be more appropriate [facility] (no male beds currently available), and [facility] (awaiting call back) at the 2 options in [place] for memory care He is medically stable (since 2/5)  when a bed is available   Oral phase dysphagia SLP consulted Currently on dysphagia 2 (chopped) diet with nectar thick liquids   Severe malnutrition Nutrition Problem: Severe Malnutrition Etiology: chronic illness Signs/Symptoms: severe muscle depletion, severe fat depletion Interventions: Magic cup   Influenza with PNA Resolved Treated with 5 days of Ceftriaxone and Azithromycin No longer on precautions   SBO Resolved Surgery has signed off   R UPJ Stone Mild hydronephrosis on presentation, resolved on repeat CT Continue doxazosin Urology recommended outpatient f/u As symptomatic currently   Dementia Delirium precautions Continue memantine, Seroquel Posey belt discontinued on 2/14   Hypothyroidism Continue Synthroid   COPD Continue Albuterol Will add Mucinex   Mobility: Encourage  ambulation  Goals of care   Code Status: Full Code     DVT prophylaxis:  SCDs Start: 04/13/23 0540   Antimicrobials: None Fluid: None Consultants: None currently Family Communication: Not at bedside  Status: Inpatient Level of care:  Med-Surg   Patient is from: Home Needs to continue in-hospital care: Difficult to place.  No essential change in last 24 hours     Diet:  Diet Order             DIET DYS 2 Room service appropriate? No; Fluid consistency: Thin  Diet effective now           DIET DYS 2                   Scheduled Meds:  doxazosin  1 mg Oral Daily   feeding supplement  237 mL Oral BID BM   levothyroxine  25 mcg Oral QAC breakfast   memantine  10 mg Oral Daily   multivitamin with minerals  1 tablet Oral Daily   pantoprazole  40 mg Oral Daily   QUEtiapine  25 mg Oral BID    PRN meds: acetaminophen **OR** acetaminophen, guaiFENesin, ipratropium-albuterol, ondansetron **OR** ondansetron (ZOFRAN) IV   Infusions:    Antimicrobials: Anti-infectives (From admission, onward)    Start     Dose/Rate Route Frequency Ordered Stop   04/14/23 0000  azithromycin (ZITHROMAX) 500 mg in sodium chloride 0.9 % 250 mL IVPB        500 mg 250 mL/hr over 60 Minutes Intravenous Every 24 hours 04/13/23 0541 04/18/23 0110   04/13/23 2200  cefTRIAXone (ROCEPHIN) 2 g in sodium chloride 0.9 % 100 mL IVPB        2  g 200 mL/hr over 30 Minutes Intravenous Every 24 hours 04/13/23 0541 04/17/23 2317   04/12/23 2345  cefTRIAXone (ROCEPHIN) 1 g in sodium chloride 0.9 % 100 mL IVPB        1 g 200 mL/hr over 30 Minutes Intravenous  Once 04/12/23 2337 04/13/23 0034   04/12/23 2345  azithromycin (ZITHROMAX) 500 mg in sodium chloride 0.9 % 250 mL IVPB        500 mg 250 mL/hr over 60 Minutes Intravenous  Once 04/12/23 2337 04/13/23 0102       Objective: Vitals:   05/11/23 0417 05/11/23 0922  BP: 121/76 118/69  Pulse: 78 70  Resp: 18 16  Temp: (!) 97.4 F (36.3 C) 97.7  F (36.5 C)  SpO2: 94% 96%    Intake/Output Summary (Last 24 hours) at 05/11/2023 1252 Last data filed at 05/11/2023 1039 Gross per 24 hour  Intake 717 ml  Output 785 ml  Net -68 ml   Filed Weights   04/24/23 0900 05/07/23 0522 05/08/23 0608  Weight: 54.6 kg 36.1 kg 39.4 kg   Weight change:  Body mass index is 14.02 kg/m.   Physical Exam: General exam: Pleasant, [name].  Not in distress Skin: No rashes, lesions or ulcers. HEENT: Atraumatic, normocephalic, no obvious bleeding Lungs: Clear to auscultation bilaterally,  CVS: S1, S2, no murmur,   GI/Abd: Soft, nontender, nondistended, bowel sound present,   CNS: Alert, awake, oriented to place only Psychiatry: Sad affect Extremities: No pedal edema, no calf tenderness,   Data Review: I have personally reviewed the laboratory data and studies available.  F/u labs  Unresulted Labs (From admission, onward)    None       Total time spent in review of labs and imaging, patient evaluation, formulation of plan, documentation and communication with family: 25 minutes  Signed, Lorin Glass, MD [facility] 05/11/2023

## 2023-05-11 NOTE — Progress Notes (Signed)
 Speech Language Pathology Treatment: Dysphagia  Patient Details Name: Anthony Ball MRN: [id number] DOB: Dec 11, 1934 Today's Date: 05/11/2023 Time: 7829-5621 SLP Time Calculation (min) (ACUTE ONLY): 14 min  Assessment / Plan / Recommendation Clinical Impression  Pt was sitting on the EOB with his lunch plate in his lap, self-feeding upon SLP arrival. He sometimes fills his mouth with more food before swallowing, for which SLP provided cues for pacing and oral clearance. Pt does clear his throat infrequently, but this is also noted to be a recommended strategy for him. He had one episode of coughing that followed a more impulsive rate of drinking, after which pt self-identified that he "drank too fast." Min cues were given to slow his rate with liquids as well. Recommend maintaining current diet and precautions. He may benefit from increased assistance at meals.    HPI HPI: Anthony Ball is a 89 y.o. male with history of COPD, dementia, hypothyroidism who was recently evaluated by cardiologist for chest pain and had stress test on 04/02/2013 which was unremarkable with normal LV was brought to the ER after patient was complaining of shortness of breath and productive cough.  In the ER patient was hypoxic requiring 2 L oxygen with chest x-ray showing concerning features for infiltrates  Patient was started on empiric antibiotics for pneumonia. Found to have SBO resolved per MD noted 1/28. MD note says pt "ddid cough some with drinking water."      SLP Plan  Continue with current plan of care      Recommendations for follow up therapy are one component of a multi-disciplinary discharge planning process, led by the attending physician.  Recommendations may be updated based on patient status, additional functional criteria and insurance authorization.    Recommendations  Diet recommendations: Dysphagia 2 (fine chop);Thin liquid Liquids provided via: Cup;Straw Medication Administration: Crushed with  puree Supervision: Patient able to self feed;Full supervision/cueing for compensatory strategies Compensations: Slow rate;Small sips/bites Postural Changes and/or Swallow Maneuvers: Seated upright 90 degrees                  Oral care BID   Frequent or constant Supervision/Assistance Dysphagia, oropharyngeal phase (R13.12)     Continue with current plan of care     Mahala Menghini., M.A. CCC-SLP Acute Rehabilitation Services Office [phone]  Secure chat preferred   05/11/2023, 2:46 PM

## 2023-05-11 NOTE — Plan of Care (Signed)

## 2023-05-11 NOTE — TOC Progression Note (Signed)
 Transition of Care [facility]) - Progression Note    Patient Details  Name: Anthony Ball MRN: [id number] Date of Birth: 07-Sep-1934  [facility] CM/SW Contact  Dontavian Marchi A [place], LCSW Phone Number: 05/11/2023, 2:42 PM  Clinical Narrative:     CSW met with pt and Steward Drone from [facility] at bedside to do interview for placement with facility. Requested acceptance of Monday/Tuesday of next week. They stated that they could accept patient for admission. Requested updated FL2 with memory care specification. Updated progress notes with results of TB skin test as well. Hospital bed and wheelchair requested to facility.   They stated pt's nephew would be able to assist with transport.  EDD early next week pending delivery of equipment and all documents received by facility. RNCM notified of placement and equipment, following to assist. Provider notified.    TOC will continue to follow.   Expected Discharge Plan: Skilled Nursing Facility Barriers to Discharge: SNF Pending bed offer  Expected Discharge Plan and Services In-house Referral: Clinical Social Work     Living arrangements for the past 2 months: Single Family Home Expected Discharge Date: 04/20/23                                     Social Determinants of Health (SDOH) Interventions SDOH Screenings   Food Insecurity: No Food Insecurity (04/13/2023)  Housing: Low Risk  (04/13/2023)  Transportation Needs: No Transportation Needs (04/13/2023)  Utilities: Not At Risk (04/13/2023)  Social Connections: Unknown (04/13/2023)  Tobacco Use: Unknown (04/13/2023)    Readmission Risk Interventions     No data to display

## 2023-05-11 NOTE — Progress Notes (Signed)
 Pt's PPD ( Tuberculin skin test reading). No redness or induration noted. PPD test negative 05/11/23 1702.

## 2023-05-11 NOTE — TOC Progression Note (Signed)
 Transition of Care [facility]) - Progression Note    Patient Details  Name: ADRYEN COOKSON MRN: [id number] Date of Birth: [birth year]  Transition of Care [facility]) CM/SW Contact  Janae Bridgeman, RN Phone Number: 05/11/2023, 2:44 PM  Clinical Narrative:    CM spoke with [facility], CM and [facility] has accepted the patient for admission on Monday/Tuesday of this coming week.  The facility is requesting hospital bed and wheelchair at the facility.  I ordered both hospital bed and wheelchair and called Zack, CM with [employer] and provided him with number to CM at [facility] Steward Drone [phone].  CM and MSW will continue to follow the patient for [facility] needs to disposition to the facility the first of next week.   Expected Discharge Plan: Skilled Nursing Facility Barriers to Discharge: SNF Pending bed offer  Expected Discharge Plan and Services In-house Referral: Clinical Social Work     Living arrangements for the past 2 months: Single Family Home Expected Discharge Date: 04/20/23                                     Social Determinants of Health (SDOH) Interventions SDOH Screenings   Food Insecurity: No Food Insecurity (04/13/2023)  Housing: Low Risk  (04/13/2023)  Transportation Needs: No Transportation Needs (04/13/2023)  Utilities: Not At Risk (04/13/2023)  Social Connections: Unknown (04/13/2023)  Tobacco Use: Unknown (04/13/2023)    Readmission Risk Interventions     No data to display

## 2023-05-11 NOTE — Progress Notes (Signed)
    Durable Medical Equipment  (From admission, onward)           Start     Ordered   05/11/23 1431  For home use only DME standard manual wheelchair with seat cushion  Once       Comments: Patient suffers from generalized weakness, severe malnutrition which impairs their ability to perform daily activities like dressing in the home.  A walker will not resolve issue with performing activities of daily living. A wheelchair will allow patient to safely perform daily activities. Patient can safely propel the wheelchair in the home or has a caregiver who can provide assistance. Length of need Lifetime. Accessories: elevating leg rests (ELRs), wheel locks, extensions and anti-tippers.  Patient is 5'6 in height, 39 kg weight   05/11/23 1432   05/11/23 1426  For home use only DME Hospital bed  Once       Question Answer Comment  Length of Need Lifetime   Patient has (list medical condition): Generalized weakness, Severe Malnutrition, S/P pneumonia, dementia, COPD   The above medical condition requires: Patient requires the ability to reposition frequently   Head must be elevated greater than: 30 degrees   Bed type Semi-electric   Support Surface: Gel Overlay      05/11/23 1432

## 2023-05-12 NOTE — Plan of Care (Signed)

## 2023-05-12 NOTE — Progress Notes (Signed)
 PROGRESS NOTE  PERLE GIBBON  DOB: 11/19/1934  PCP: Georgann Housekeeper, MD JXB:[id number]  DOA: 04/12/2023  LOS: 29 days  Hospital Day: 31  Brief narrative: BURHANUDDIN KOHLMANN is a 89 y.o. male with PMH significant for COPD, GERD, hypothyroidism  1/23, patient was admitted for influenza A with pneumonia, completed treatment with ceftriaxone, azithromycin. Subsequently found to have SBO as well as right UVJ stone with mild hydronephrosis. SBO resolved, surgery signed off  Currently pending placement.  Subjective: Patient was seen and examined this morning.   Sitting up in recliner. Not in distress.  No family at bedside.  Assessment and plan: Difficult placement Previously lived at home independently  Unable to return home due to dementia SNF declined by insurance company Family is arranging for private pay at [facility], although this is SNF and ALF/memory care may be more appropriate [facility] (no male beds currently available), and [facility] (awaiting call back) at the 2 options in [place] for memory care He is medically stable (since 2/5)  when a bed is available   Oral phase dysphagia SLP consulted Currently on dysphagia 2 (chopped) diet with nectar thick liquids   Severe malnutrition Nutrition Problem: Severe Malnutrition Etiology: chronic illness Signs/Symptoms: severe muscle depletion, severe fat depletion Interventions: Magic cup   Influenza with PNA Resolved Treated with 5 days of Ceftriaxone and Azithromycin No longer on precautions   SBO Resolved Surgery has signed off   R UPJ Stone Mild hydronephrosis on presentation, resolved on repeat CT Continue doxazosin Urology recommended outpatient f/u As symptomatic currently   Dementia Delirium precautions Continue memantine, Seroquel Posey belt discontinued on 2/14   Hypothyroidism Continue Synthroid   COPD Continue Albuterol Will add Mucinex   Mobility: Encourage ambulation  Goals  of care   Code Status: Full Code     DVT prophylaxis:  SCDs Start: 04/13/23 0540   Antimicrobials: None Fluid: None Consultants: None currently Family Communication: Not at bedside  Status: Inpatient Level of care:  Med-Surg   Patient is from: Home Needs to continue in-hospital care: Difficult to place.  No essential change in last several days     Diet:  Diet Order             DIET DYS 2 Room service appropriate? No; Fluid consistency: Thin  Diet effective now           DIET DYS 2                   Scheduled Meds:  doxazosin  1 mg Oral Daily   feeding supplement  237 mL Oral BID BM   levothyroxine  25 mcg Oral QAC breakfast   memantine  10 mg Oral Daily   multivitamin with minerals  1 tablet Oral Daily   pantoprazole  40 mg Oral Daily   QUEtiapine  25 mg Oral BID    PRN meds: acetaminophen **OR** acetaminophen, guaiFENesin, ipratropium-albuterol, ondansetron **OR** ondansetron (ZOFRAN) IV   Infusions:    Antimicrobials: Anti-infectives (From admission, onward)    Start     Dose/Rate Route Frequency Ordered Stop   04/14/23 0000  azithromycin (ZITHROMAX) 500 mg in sodium chloride 0.9 % 250 mL IVPB        500 mg 250 mL/hr over 60 Minutes Intravenous Every 24 hours 04/13/23 0541 04/18/23 0110   04/13/23 2200  cefTRIAXone (ROCEPHIN) 2 g in sodium chloride 0.9 % 100 mL IVPB        2 g  200 mL/hr over 30 Minutes Intravenous Every 24 hours 04/13/23 0541 04/17/23 2317   04/12/23 2345  cefTRIAXone (ROCEPHIN) 1 g in sodium chloride 0.9 % 100 mL IVPB        1 g 200 mL/hr over 30 Minutes Intravenous  Once 04/12/23 2337 04/13/23 0034   04/12/23 2345  azithromycin (ZITHROMAX) 500 mg in sodium chloride 0.9 % 250 mL IVPB        500 mg 250 mL/hr over 60 Minutes Intravenous  Once 04/12/23 2337 04/13/23 0102       Objective: Vitals:   05/12/23 0521 05/12/23 0904  BP: 116/69 124/68  Pulse: 71 73  Resp: 17 18  Temp: 98.6 F (37 C) 97.7 F (36.5 C)  SpO2:  93% 92%    Intake/Output Summary (Last 24 hours) at 05/12/2023 1145 Last data filed at 05/11/2023 1818 Gross per 24 hour  Intake 118 ml  Output --  Net 118 ml   Filed Weights   04/24/23 0900 05/07/23 0522 05/08/23 0608  Weight: 54.6 kg 36.1 kg 39.4 kg   Weight change:  Body mass index is 14.02 kg/m.   Physical Exam: General exam: Pleasant, [name].  Not in distress Skin: No rashes, lesions or ulcers. HEENT: Atraumatic, normocephalic, no obvious bleeding Lungs: Clear to auscultation bilaterally,  CVS: S1, S2, no murmur,   GI/Abd: Soft, nontender, nondistended, bowel sound present,   CNS: Alert, awake, oriented to place only Psychiatry: Sad affect Extremities: No pedal edema, no calf tenderness,   Data Review: I have personally reviewed the laboratory data and studies available.  F/u labs  Unresulted Labs (From admission, onward)    None       Signed, Lorin Glass, MD [facility] 05/12/2023

## 2023-05-13 NOTE — Plan of Care (Signed)

## 2023-05-13 NOTE — Progress Notes (Signed)
 PROGRESS NOTE  Anthony Ball  DOB: [birth year]  PCP: Georgann Housekeeper, MD BJY:[id number]  DOA: 04/12/2023  LOS: 30 days  Hospital Day: 32  Brief narrative: Anthony Ball is a 89 y.o. male with PMH significant for COPD, GERD, hypothyroidism  1/23, patient was admitted for influenza A with pneumonia, completed treatment with ceftriaxone, azithromycin. Subsequently found to have SBO as well as right UVJ stone with mild hydronephrosis. SBO resolved, surgery signed off  Currently pending placement.  Subjective: Patient was seen and examined this morning.   Sitting up in recliner. Not in distress.  No family at bedside.  Assessment and plan: Difficult placement Previously lived at home independently  Unable to return home due to dementia SNF declined by insurance company Family is arranging for private pay at [facility] [place], although this is SNF and ALF/memory care may be more appropriate [facility] (no male beds currently available), and [facility] (awaiting call back) at the 2 options in [place] for memory care He is medically stable (since 2/5)  when a bed is available   Oral phase dysphagia SLP consulted Currently on dysphagia 2 (chopped) diet with nectar thick liquids   Severe malnutrition Nutrition Problem: Severe Malnutrition Etiology: chronic illness Signs/Symptoms: severe muscle depletion, severe fat depletion Interventions: Magic cup   Influenza with PNA Resolved Treated with 5 days of Ceftriaxone and Azithromycin No longer on precautions   SBO Resolved Surgery has signed off   R UPJ Stone Mild hydronephrosis on presentation, resolved on repeat CT Continue doxazosin Urology recommended outpatient f/u As symptomatic currently   Dementia Delirium precautions Continue memantine, Seroquel Posey belt discontinued on 2/14   Hypothyroidism Continue Synthroid   COPD Continue Albuterol Will add Mucinex   Mobility: Encourage ambulation  Goals  of care   Code Status: Full Code     DVT prophylaxis:  SCDs Start: 04/13/23 0540   Antimicrobials: None Fluid: None Consultants: None currently Family Communication: Not at bedside  Status: Inpatient Level of care:  Med-Surg   Patient is from: Home Needs to continue in-hospital care: Difficult to place.  No essential change in last several days     Diet:  Diet Order             DIET DYS 2 Room service appropriate? No; Fluid consistency: Thin  Diet effective now           DIET DYS 2                   Scheduled Meds:  doxazosin  1 mg Oral Daily   feeding supplement  237 mL Oral BID BM   levothyroxine  25 mcg Oral QAC breakfast   memantine  10 mg Oral Daily   multivitamin with minerals  1 tablet Oral Daily   pantoprazole  40 mg Oral Daily   QUEtiapine  25 mg Oral BID    PRN meds: acetaminophen **OR** acetaminophen, guaiFENesin, ipratropium-albuterol, ondansetron **OR** ondansetron (ZOFRAN) IV   Infusions:    Antimicrobials: Anti-infectives (From admission, onward)    Start     Dose/Rate Route Frequency Ordered Stop   04/14/23 0000  azithromycin (ZITHROMAX) 500 mg in sodium chloride 0.9 % 250 mL IVPB        500 mg 250 mL/hr over 60 Minutes Intravenous Every 24 hours 04/13/23 0541 04/18/23 0110   04/13/23 2200  cefTRIAXone (ROCEPHIN) 2 g in sodium chloride 0.9 % 100 mL IVPB        2 g  200 mL/hr over 30 Minutes Intravenous Every 24 hours 04/13/23 0541 04/17/23 2317   04/12/23 2345  cefTRIAXone (ROCEPHIN) 1 g in sodium chloride 0.9 % 100 mL IVPB        1 g 200 mL/hr over 30 Minutes Intravenous  Once 04/12/23 2337 04/13/23 0034   04/12/23 2345  azithromycin (ZITHROMAX) 500 mg in sodium chloride 0.9 % 250 mL IVPB        500 mg 250 mL/hr over 60 Minutes Intravenous  Once 04/12/23 2337 04/13/23 0102       Objective: Vitals:   05/13/23 0458 05/13/23 0751  BP: 119/70 124/84  Pulse: 69 78  Resp: 18 18  Temp: 97.8 F (36.6 C) 97.8 F (36.6 C)  SpO2:  91% 94%   No intake or output data in the 24 hours ending 05/13/23 1436  Filed Weights   04/24/23 0900 05/07/23 0522 05/08/23 0608  Weight: 54.6 kg 36.1 kg 39.4 kg   Weight change:  Body mass index is 14.02 kg/m.   Physical Exam: General exam: Pleasant, [name].  Not in distress Skin: No rashes, lesions or ulcers. HEENT: Atraumatic, normocephalic, no obvious bleeding Lungs: Clear to auscultation bilaterally,  CVS: S1, S2, no murmur,   GI/Abd: Soft, nontender, nondistended, bowel sound present,   CNS: Alert, awake, oriented to place only Psychiatry: Sad affect Extremities: No pedal edema, no calf tenderness,   Data Review: I have personally reviewed the laboratory data and studies available.  F/u labs  Unresulted Labs (From admission, onward)    None       Signed, Lorin Glass, MD [facility] 05/13/2023

## 2023-05-13 NOTE — Progress Notes (Signed)
   05/13/23 1003  Mobility  Activity Ambulated with assistance in hallway  Level of Assistance Standby assist, set-up cues, supervision of patient - no hands on  Assistive Device Front wheel walker  Distance Ambulated (ft) 75 ft  Activity Response Tolerated well  Mobility Referral Yes  Mobility visit 1 Mobility  Mobility Specialist Start Time (ACUTE ONLY) 0943  Mobility Specialist Stop Time (ACUTE ONLY) 1003  Mobility Specialist Time Calculation (min) (ACUTE ONLY) 20 min   Mobility Specialist: Progress Note  Pre-Mobility:      HR 85, SpO2 92% RA During Mobility: HR 91, SpO2 91%, RA Post-Mobility:    HR 91, SpO2 93%, RA  Pt agreeable to mobility session - received in bed. Pt was asymptomatic throughout session with no complaints, A&O x3, did not know month or day.  Returned to chair with all needs met - call bell within reach. Chair alarm on. Pt without nasal cannula BOS and throughout session - RN notified and aware.   Barnie Mort, BS Mobility Specialist Please contact via SecureChat or  Rehab office at [phone].

## 2023-05-14 DIAGNOSIS — J09X1 Influenza due to identified novel influenza A virus with pneumonia: Secondary | ICD-10-CM | POA: Diagnosis not present

## 2023-05-14 MED ORDER — DOXAZOSIN MESYLATE 1 MG PO TABS: 1.0000 mg | ORAL_TABLET | Freq: Every day | ORAL | Status: AC

## 2023-05-14 MED ORDER — ENSURE ENLIVE PO LIQD: 237.0000 mL | Freq: Two times a day (BID) | ORAL | Status: AC

## 2023-05-14 MED ORDER — ADULT MULTIVITAMIN W/MINERALS CH: 1.0000 | ORAL_TABLET | Freq: Every day | ORAL | Status: AC

## 2023-05-14 NOTE — Plan of Care (Signed)

## 2023-05-14 NOTE — Discharge Summary (Signed)
 Physician Discharge Summary  ACESON LABELL ZOX:[id number] DOB: Aug 04, 1934 DOA: 04/12/2023  PCP: Georgann Housekeeper, MD  Admit date: 04/12/2023 Discharge date: 05/15/2023  Admitted From: Home Discharge disposition: Memory care   Brief narrative: Anthony Ball is a 89 y.o. male with PMH significant for COPD, GERD, hypothyroidism  1/23, patient was admitted for influenza A with pneumonia, completed treatment with ceftriaxone, azithromycin. Subsequently found to have SBO as well as right UVJ stone with mild hydronephrosis. SBO resolved, surgery signed off  Currently pending placement.  Subjective: Patient was seen and examined this morning.   Sitting up in recliner. Not in distress.  No family at bedside. Case manager note from this morning reviewed.  Plan for discharge to memory care  Hospital course: Difficult placement Previously lived at home independently  Unable to return home due to dementia SNF declined by insurance company Plan for discharge to memory care He has been medically stable (since 2/5)  when a bed is available   Oral phase dysphagia SLP consulted Currently on dysphagia 2 (chopped) diet with nectar thick liquids   Severe malnutrition Nutrition Problem: Severe Malnutrition Etiology: chronic illness Signs/Symptoms: severe muscle depletion, severe fat depletion Interventions: Magic cup   Influenza with PNA Resolved Treated with 5 days of Ceftriaxone and Azithromycin No longer on precautions   SBO Resolved Surgery has signed off   R UPJ Stone Mild hydronephrosis on presentation, resolved on repeat CT Continue doxazosin Urology recommended outpatient f/u As symptomatic currently   Dementia Delirium precautions Continue memantine, Seroquel Posey belt discontinued on 2/14   Hypothyroidism Continue Synthroid   COPD Continue Albuterol   Mobility: Encourage ambulation  Goals of care   Code Status: Full Code    Diet:  Diet Order              DIET DYS 3 Room service appropriate? No; Fluid consistency: Thin  Diet effective now           Diet general           DIET DYS 2                   Nutritional status:  Body mass index is 14.02 kg/m.  Nutrition Problem: Severe Malnutrition Etiology: chronic illness Signs/Symptoms: severe muscle depletion, severe fat depletion  Wounds:  -    Discharge Exam:   Vitals:   05/13/23 1638 05/13/23 2105 05/14/23 0520 05/14/23 0837  BP: 116/69 117/68 126/76 132/71  Pulse: 82 77 71 71  Resp: 18 18 18 18   Temp: 98.6 F (37 C) (!) 97.4 F (36.3 C) (!) 97.5 F (36.4 C)   TempSrc: Oral Oral Oral   SpO2: 96% 92% 92% 93%  Weight:      Height:        Body mass index is 14.02 kg/m.   General exam: Pleasant, [name].  Not in distress Skin: No rashes, lesions or ulcers. HEENT: Atraumatic, normocephalic, no obvious bleeding Lungs: Clear to auscultation bilaterally,  CVS: S1, S2, no murmur,   GI/Abd: Soft, nontender, nondistended, bowel sound present,   CNS: Alert, awake, oriented to place only Psychiatry: Sad affect Extremities: No pedal edema, no calf tenderness,   Follow ups:    Follow-up Information     [facility] Follow up.   Why: [facility] will provide a hospital bed and wheelchair delivery to the ALF facility. Contact information: [address] [place] [address] [phone]         Donette Larry,  Jerelyn Scott, MD Follow up.   Specialty: Internal Medicine Contact information: [address] [place] [address] [phone]                 Discharge Instructions:   Discharge Instructions     Ambulatory referral to Urology   Complete by: As directed    Post hospital follow up. See in 2-3 weeks for labs and assessment of stone passage   Call MD for:  difficulty breathing, headache or visual disturbances   Complete by: As directed    Call MD for:  difficulty breathing, headache or visual disturbances    Complete by: As directed    Call MD for:  extreme fatigue   Complete by: As directed    Call MD for:  extreme fatigue   Complete by: As directed    Call MD for:  hives   Complete by: As directed    Call MD for:  hives   Complete by: As directed    Call MD for:  persistant dizziness or light-headedness   Complete by: As directed    Call MD for:  persistant dizziness or light-headedness   Complete by: As directed    Call MD for:  persistant nausea and vomiting   Complete by: As directed    Call MD for:  persistant nausea and vomiting   Complete by: As directed    Call MD for:  redness, tenderness, or signs of infection (pain, swelling, redness, odor or green/yellow discharge around incision site)   Complete by: As directed    Call MD for:  severe uncontrolled pain   Complete by: As directed    Call MD for:  severe uncontrolled pain   Complete by: As directed    Call MD for:  temperature >100.4   Complete by: As directed    Call MD for:  temperature >100.4   Complete by: As directed    DIET DYS 2   Complete by: As directed    Fine Chopped Diet   Fluid consistency: Nectar Thick   Diet general   Complete by: As directed    Discharge instructions   Complete by: As directed    1. Follow up with primary care provider in 1-2 weeks after discharge.   Increase activity slowly   Complete by: As directed    Increase activity slowly   Complete by: As directed        Discharge Medications:   Allergies as of 05/14/2023   No Known Allergies      Medication List     STOP taking these medications    albuterol 108 (90 Base) MCG/ACT inhaler Commonly known as: VENTOLIN HFA   Fish Oil 1000 MG Caps   meloxicam 15 MG tablet Commonly known as: MOBIC   omeprazole 20 MG capsule Commonly known as: Oceanographer Calcium 500 MG Tabs   triamcinolone cream 0.1 % Commonly known as: KENALOG       TAKE these medications    acetaminophen 325 MG tablet Commonly known  as: TYLENOL Take 2 tablets (650 mg total) by mouth every 6 (six) hours as needed for mild pain (pain score 1-3) (or Fever >/= 101).   doxazosin 1 MG tablet Commonly known as: CARDURA Take 1 tablet (1 mg total) by mouth daily. Start taking on: May 15, 2023   feeding supplement Liqd Take 237 mLs by mouth 2 (two) times daily between meals.   ipratropium-albuterol 0.5-2.5 (3) MG/3ML Soln  Commonly known as: DUONEB Take 3 mLs by nebulization in the morning, at noon, in the evening, and at bedtime.   levothyroxine 25 MCG tablet Commonly known as: SYNTHROID Take 25 mcg by mouth daily before breakfast.   memantine 10 MG tablet Commonly known as: NAMENDA Take 10 mg by mouth daily.   multivitamin with minerals Tabs tablet Take 1 tablet by mouth daily. Start taking on: May 15, 2023   ondansetron 4 MG tablet Commonly known as: ZOFRAN Take 1 tablet (4 mg total) by mouth every 6 (six) hours as needed for nausea.   pantoprazole 40 MG tablet Commonly known as: PROTONIX Take 1 tablet (40 mg total) by mouth daily.   QUEtiapine 25 MG tablet Commonly known as: SEROQUEL Take 1 tablet (25 mg total) by mouth 2 (two) times daily.               Durable Medical Equipment  (From admission, onward)           Start     Ordered   05/11/23 1431  For home use only DME standard manual wheelchair with seat cushion  Once       Comments: Patient suffers from generalized weakness, severe malnutrition which impairs their ability to perform daily activities like dressing in the home.  A walker will not resolve issue with performing activities of daily living. A wheelchair will allow patient to safely perform daily activities. Patient can safely propel the wheelchair in the home or has a caregiver who can provide assistance. Length of need Lifetime. Accessories: elevating leg rests (ELRs), wheel locks, extensions and anti-tippers.  Patient is 5'6 in height, 39 kg weight   05/11/23 1432    05/11/23 1426  For home use only DME Hospital bed  Once       Question Answer Comment  Length of Need Lifetime   Patient has (list medical condition): Generalized weakness, Severe Malnutrition, S/P pneumonia, dementia, COPD   The above medical condition requires: Patient requires the ability to reposition frequently   Head must be elevated greater than: 30 degrees   Bed type Semi-electric   Support Surface: Gel Overlay      05/11/23 1432             The results of significant diagnostics from this hospitalization (including imaging, microbiology, ancillary and laboratory) are listed below for reference.    Procedures and Diagnostic Studies:   DG Abd 1 View Result Date: 04/13/2023 CLINICAL DATA:  NG tube EXAM: ABDOMEN - 1 VIEW COMPARISON:  Chest x-ray 04/13/2023 FINDINGS: Enteric tube tip is at the level of the gastroesophageal junction. Dilated small bowel loops are again seen. There are atelectatic changes in the right lung base. IMPRESSION: Enteric tube tip is at the level of the gastroesophageal junction. Recommend advancement 10 cm. Electronically Signed   By: Darliss Cheney M.D.   On: 04/13/2023 20:13   DG Abd Portable 1 View Result Date: 04/13/2023 CLINICAL DATA:  NG tube placement. EXAM: PORTABLE ABDOMEN - 1 VIEW COMPARISON:  Radiograph earlier today FINDINGS: Tip and side port of the enteric tube below the diaphragm in the stomach. Gaseous bowel distention in the upper abdomen again seen. Patchy right lung base opacity. IMPRESSION: Tip and side port of the enteric tube below the diaphragm in the stomach. Electronically Signed   By: Narda Rutherford M.D.   On: 04/13/2023 16:47   DG Abd Portable 1 View Result Date: 04/13/2023 CLINICAL DATA:  NG tube placement EXAM: PORTABLE ABDOMEN -  1 VIEW COMPARISON:  04/13/2023 FINDINGS: Enteric tube tip is retracted, side-port at the level of the distal esophagus, tip in the region of GE junction. Dilated small bowel in the upper abdomen.  Atelectasis or infiltrates at the bases. IMPRESSION: Enteric tube tip is retracted, side-port at the level of the distal esophagus, tip in the region of GE junction. Recommend advancement by around 10 cm for more optimal positioning Electronically Signed   By: Jasmine Pang M.D.   On: 04/13/2023 15:16   DG Abd Portable 1 View Result Date: 04/13/2023 CLINICAL DATA:  NG tube placement. EXAM: PORTABLE ABDOMEN - 1 VIEW COMPARISON:  04/09/2019 FINDINGS: NG tube tip is in the gastric fundus with proximal side port below the GE junction. Diffuse gaseous small bowel distension is seen in the visualized upper abdomen with small bowel loops measuring up to 3.5 cm. Chronic interstitial changes noted in the visualized lung bases. IMPRESSION: 1. NG tube tip is in the gastric fundus. 2. Diffuse gaseous small bowel distension. Electronically Signed   By: Kennith Center M.D.   On: 04/13/2023 07:06   CT CHEST ABDOMEN PELVIS WO CONTRAST Result Date: 04/13/2023 CLINICAL DATA:  Shortness of breath, coughing and congestion, suspected small-bowel obstruction with abdominal pain. EXAM: CT CHEST, ABDOMEN AND PELVIS WITHOUT CONTRAST TECHNIQUE: Multidetector CT imaging of the chest, abdomen and pelvis was performed following the standard protocol without IV contrast. RADIATION DOSE REDUCTION: This exam was performed according to the departmental dose-optimization program which includes automated exposure control, adjustment of the mA and/or kV according to patient size and/or use of iterative reconstruction technique. COMPARISON:  Portable chest yesterday, AP and lateral chest 03/16/2023, flat plate abdomen film 04/09/2019, and CT abdomen and pelvis without contrast 02/04/2018. FINDINGS: CT CHEST FINDINGS Cardiovascular: Cardiac size is normal. Left-sided coronary arteries are heavily calcified. Pulmonary arteries and veins are normal in caliber. Small pericardial effusion noted. There are calcifications in the aortic valve leaflets.  Scattered aortic calcific plaques without aneurysm. There is minimal calcific plaque in the great vessels. Mediastinum/Nodes: The esophageal wall is diffusely thickened including over the GE junction. Small hiatal hernia. Endoscopy recommended. Thyroid gland is atrophic without mass.  Axillary spaces are clear. No enlarged intrathoracic lymph nodes are identified without contrast. A thoracic trachea in both main bronchi are unremarkable. Lungs/Pleura: Diffuse bronchial thickening noted greater in the lower lobes. Mild biapical pleural-parenchymal scarring calcifications. There is consolidation with air bronchograms in the right lower lobe, primarily in the basal segments and could be due to atelectasis, pneumonia or aspiration. This was not seen on the AP and lateral chest from 03/16/2023. There is additional tree-in-bud interstitial micronodularity in the superior segment of the right lower lobe in the superior segment of the left lower lobe, consistent with infectious or inflammatory bronchiolitis. There is additional band consolidation with associated scattered subsegmental bronchial plugging in the medial basal left lower lobe. This could also be seen with aspiration. The lungs are otherwise generally clear. No pleural effusion is seen. Musculoskeletal: Osteopenia with mild degenerative changes of thoracic spine. No acute or significant osseous findings. No chest wall mass. CT ABDOMEN PELVIS FINDINGS Hepatobiliary: Loss of fine detail due to streak artifacts from the patient's arms in the field. No obvious liver mass. No obvious gallbladder wall thickening or calcified stones. No biliary dilatation. Pancreas: Partially atrophic.  No focal abnormality. Spleen: No abnormality.  No splenomegaly. Adrenals/Urinary Tract: There is no adrenal mass. No contour deforming abnormality of the unenhanced kidneys. There are occasional punctate nonobstructive caliceal  stones in both renal collecting systems. On the right,  there is a 5 mm in length by 2 mm in diameter rod-like UPJ stone, with mild hydronephrosis upstream. Both ureters are otherwise clear. The bladder is unremarkable. There is impression on the bladder by the enlarged prostate. Stomach/Bowel: The stomach distended with air and fluid. There is diffuse small bowel dilatation except for small caliber segments in the right lower quadrant, maximum small bowel caliber 3.7 cm. A transitional segment was not found but the difference in caliber is more suggestive of obstruction than ileus. Etiology is undetermined. An appendix is not seen. There is diffuse colonic diverticulosis heaviest in the sigmoid, without diverticulitis or wall thickening. Vascular/Lymphatic: Aortic atherosclerosis. No enlarged abdominal or pelvic lymph nodes. Reproductive: Moderate to severe prostatomegaly, transverse axis 6 cm. Other: No abdominal wall hernia. Small left inguinal fat hernia. Small volume of perihepatic and pelvic ascites. No pneumatosis, free air or free hemorrhage. No localizing collections. Musculoskeletal: Degenerative change lumbar spine, mild dextrorotary scoliosis. Most advanced degenerative change at L2-3 and L3-4. No acute or other significant osseous findings. IMPRESSION: 1. Diffuse small bowel dilatation except for small caliber segments in the right lower quadrant, maximum small bowel caliber 3.7 cm. A transitional segment was not found but the difference in caliber is more suggestive of a distal small bowel obstruction than ileus. Etiology is undetermined. 2. Small volume of perihepatic and pelvic ascites. No pneumatosis, free air or free hemorrhage. 3. Bilateral lower lobe consolidation with air bronchograms, right greater than left, which could be due to atelectasis, pneumonia or aspiration. 4. Additional tree-in-bud interstitial micronodularity in the superior segments of both lower lobes, consistent with infectious or inflammatory bronchiolitis. 5. Diffuse esophageal  wall thickening including over the GE junction. Endoscopy recommended. Small hiatal hernia. 6. Aortic and coronary artery atherosclerosis. 7. 5 mm in length by 2 mm in diameter rod-like right UPJ stone with mild hydronephrosis. 8. Nonobstructive bilateral micronephrolithiasis. 9. Prostatomegaly, transverse axis 6 cm. 10. Colonic diverticulosis without evidence of diverticulitis. 11. Osteopenia and degenerative change. Aortic Atherosclerosis (ICD10-I70.0). Electronically Signed   By: Almira Bar M.D.   On: 04/13/2023 05:21   DG Chest Port 1 View Result Date: 04/12/2023 CLINICAL DATA:  Cough, shortness of breath EXAM: PORTABLE CHEST 1 VIEW COMPARISON:  03/16/2023 FINDINGS: Heart and mediastinal contours within normal limits. Airspace opacity in the right lower lobe. Left lung clear. No effusions or acute bony abnormality. IMPRESSION: Right lower lobe airspace opacity concerning for pneumonia. Electronically Signed   By: Charlett Nose M.D.   On: 04/12/2023 19:05     Labs:   Basic Metabolic Panel: Recent Labs  Lab 05/09/23 0640  NA 138  K 4.1  CL 104  CO2 24  GLUCOSE 90  BUN 20  CREATININE 0.94  CALCIUM 8.9   GFR Estimated Creatinine Clearance: 31.4 mL/min (by C-G formula based on SCr of 0.94 mg/dL). Liver Function Tests: No results for input(s): "AST", "ALT", "ALKPHOS", "BILITOT", "PROT", "ALBUMIN" in the last 168 hours. No results for input(s): "LIPASE", "AMYLASE" in the last 168 hours. No results for input(s): "AMMONIA" in the last 168 hours. Coagulation profile No results for input(s): "INR", "PROTIME" in the last 168 hours.  CBC: Recent Labs  Lab 05/09/23 0640  WBC 6.8  NEUTROABS 3.8  HGB 13.3  HCT 41.5  MCV 88.5  PLT 376   Cardiac Enzymes: No results for input(s): "CKTOTAL", "CKMB", "CKMBINDEX", "TROPONINI" in the last 168 hours. BNP: Invalid input(s): "POCBNP" CBG: No results for input(s): "  GLUCAP" in the last 168 hours. D-Dimer No results for input(s): "DDIMER"  in the last 72 hours. Hgb A1c No results for input(s): "HGBA1C" in the last 72 hours. Lipid Profile No results for input(s): "CHOL", "HDL", "LDLCALC", "TRIG", "CHOLHDL", "LDLDIRECT" in the last 72 hours. Thyroid function studies No results for input(s): "TSH", "T4TOTAL", "T3FREE", "THYROIDAB" in the last 72 hours.  Invalid input(s): "FREET3" Anemia work up No results for input(s): "VITAMINB12", "FOLATE", "FERRITIN", "TIBC", "IRON", "RETICCTPCT" in the last 72 hours. Microbiology No results found for this or any previous visit (from the past 240 hours).  Time coordinating discharge: 25 minutes  Signed: Sarahbeth Cashin  [facility] 05/14/2023, 2:03 PM

## 2023-05-14 NOTE — Progress Notes (Signed)
 Mobility Specialist: Progress Note   05/14/23 1201  Mobility  Activity Ambulated with assistance in hallway  Level of Assistance Standby assist, set-up cues, supervision of patient - no hands on  Assistive Device Front wheel walker  Distance Ambulated (ft) 500 ft  Activity Response Tolerated well  Mobility Referral Yes  Mobility visit 1 Mobility  Mobility Specialist Start Time (ACUTE ONLY) 0817  Mobility Specialist Stop Time (ACUTE ONLY) 0830  Mobility Specialist Time Calculation (min) (ACUTE ONLY) 13 min    Received pt in bed having no complaints and agreeable to mobility. Pt was asymptomatic throughout ambulation and returned to room w/o fault. Left in chair w/ call bell in reach and all needs met.   Maurene Capes Mobility Specialist Please contact via SecureChat or Rehab office at [phone]

## 2023-05-14 NOTE — TOC Progression Note (Addendum)
 Transition of Care [facility]) - Progression Note    Patient Details  Name: Anthony Ball MRN: [id number] Date of Birth: 07-Jan-1935  [facility] CM/SW Contact  Janae Bridgeman, RN Phone Number: 05/14/2023, 11:43 AM  Clinical Narrative:    CM spoke with [facility] and they plan to receive the patient tomorrow for admission.  Patient needs hospital bed and wheelchair delivered to the facility today.  I called and spoke with Amy, CM with [employer] and requested delivery of equipment to the facility.  Clinical and orders for DME were faxed to [employer] to fax # [phone].  MD was requested to order COVID test today for pending discharge to facility tomorrow.  05/14/23 1420 - CM called and spoke with Steward Drone, DON at [facility] and they plan to pick the patient up for discharge at the front entrance a 830 am.  Discharge summary was faxed to the facility today at fax # [phone].   Expected Discharge Plan: Skilled Nursing Facility Barriers to Discharge: SNF Pending bed offer  Expected Discharge Plan and Services In-house Referral: Clinical Social Work     Living arrangements for the past 2 months: Single Family Home Expected Discharge Date: 04/20/23                                     Social Determinants of Health (SDOH) Interventions SDOH Screenings   Food Insecurity: No Food Insecurity (04/13/2023)  Housing: Low Risk  (04/13/2023)  Transportation Needs: No Transportation Needs (04/13/2023)  Utilities: Not At Risk (04/13/2023)  Social Connections: Unknown (04/13/2023)  Tobacco Use: Unknown (04/13/2023)    Readmission Risk Interventions     No data to display

## 2023-05-14 NOTE — TOC Progression Note (Signed)
 Transition of Care [facility]) - Progression Note    Patient Details  Name: SHEDDRICK LATTANZIO MRN: [id number] Date of Birth: 26-Sep-1934  [facility]) CM/SW Contact  Janae Bridgeman, RN Phone Number: 05/14/2023, 10:08 AM  Clinical Narrative:    CM called and left a message with [facility] to follow up regarding delivery of ordered equipment for the facility - including hospital bed.   I left a message with CM, Steward Drone at the facility - [phone].   Expected Discharge Plan: Skilled Nursing Facility Barriers to Discharge: SNF Pending bed offer  Expected Discharge Plan and Services In-house Referral: Clinical Social Work     Living arrangements for the past 2 months: Single Family Home Expected Discharge Date: 04/20/23                                     Social Determinants of Health (SDOH) Interventions SDOH Screenings   Food Insecurity: No Food Insecurity (04/13/2023)  Housing: Low Risk  (04/13/2023)  Transportation Needs: No Transportation Needs (04/13/2023)  Utilities: Not At Risk (04/13/2023)  Social Connections: Unknown (04/13/2023)  Tobacco Use: Unknown (04/13/2023)    Readmission Risk Interventions     No data to display

## 2023-05-14 NOTE — Progress Notes (Signed)
 PROGRESS NOTE  Anthony Ball  DOB: Jan 25, 1935  PCP: Georgann Housekeeper, MD QMV:[id number]  DOA: 04/12/2023  LOS: 31 days  Hospital Day: 33  Brief narrative: Anthony Ball is a 89 y.o. male with PMH significant for COPD, GERD, hypothyroidism  1/23, patient was admitted for influenza A with pneumonia, completed treatment with ceftriaxone, azithromycin. Subsequently found to have SBO as well as right UVJ stone with mild hydronephrosis. SBO resolved, surgery signed off  Currently pending placement.  Subjective: Patient was seen and examined this morning.   Sitting up in recliner. Not in distress.  No family at bedside. Case manager note from this morning reviewed.  Assessment and plan: Difficult placement Previously lived at home independently  Unable to return home due to dementia SNF declined by insurance company Family is arranging for private pay at [facility] [place], although this is SNF and ALF/memory care may be more appropriate [facility] (no male beds currently available), and [facility] (awaiting call back) at the 2 options in [place] for memory care He is medically stable (since 2/5)  when a bed is available   Oral phase dysphagia SLP consulted Currently on dysphagia 2 (chopped) diet with nectar thick liquids   Severe malnutrition Nutrition Problem: Severe Malnutrition Etiology: chronic illness Signs/Symptoms: severe muscle depletion, severe fat depletion Interventions: Magic cup   Influenza with PNA Resolved Treated with 5 days of Ceftriaxone and Azithromycin No longer on precautions   SBO Resolved Surgery has signed off   R UPJ Stone Mild hydronephrosis on presentation, resolved on repeat CT Continue doxazosin Urology recommended outpatient f/u As symptomatic currently   Dementia Delirium precautions Continue memantine, Seroquel Posey belt discontinued on 2/14   Hypothyroidism Continue Synthroid   COPD Continue Albuterol Will add  Mucinex   Mobility: Encourage ambulation  Goals of care   Code Status: Full Code     DVT prophylaxis:  SCDs Start: 04/13/23 0540   Antimicrobials: None Fluid: None Consultants: None currently Family Communication: Not at bedside  Status: Inpatient Level of care:  Med-Surg   Patient is from: Home Needs to continue in-hospital care: Difficult to place.  No essential change in last several days     Diet:  Diet Order             DIET DYS 3 Room service appropriate? No; Fluid consistency: Thin  Diet effective now           DIET DYS 2                   Scheduled Meds:  doxazosin  1 mg Oral Daily   feeding supplement  237 mL Oral BID BM   levothyroxine  25 mcg Oral QAC breakfast   memantine  10 mg Oral Daily   multivitamin with minerals  1 tablet Oral Daily   pantoprazole  40 mg Oral Daily   QUEtiapine  25 mg Oral BID    PRN meds: acetaminophen **OR** acetaminophen, guaiFENesin, ipratropium-albuterol, ondansetron **OR** ondansetron (ZOFRAN) IV   Infusions:    Antimicrobials: Anti-infectives (From admission, onward)    Start     Dose/Rate Route Frequency Ordered Stop   04/14/23 0000  azithromycin (ZITHROMAX) 500 mg in sodium chloride 0.9 % 250 mL IVPB        500 mg 250 mL/hr over 60 Minutes Intravenous Every 24 hours 04/13/23 0541 04/18/23 0110   04/13/23 2200  cefTRIAXone (ROCEPHIN) 2 g in sodium chloride 0.9 % 100 mL IVPB  2 g 200 mL/hr over 30 Minutes Intravenous Every 24 hours 04/13/23 0541 04/17/23 2317   04/12/23 2345  cefTRIAXone (ROCEPHIN) 1 g in sodium chloride 0.9 % 100 mL IVPB        1 g 200 mL/hr over 30 Minutes Intravenous  Once 04/12/23 2337 04/13/23 0034   04/12/23 2345  azithromycin (ZITHROMAX) 500 mg in sodium chloride 0.9 % 250 mL IVPB        500 mg 250 mL/hr over 60 Minutes Intravenous  Once 04/12/23 2337 04/13/23 0102       Objective: Vitals:   05/14/23 0520 05/14/23 0837  BP: 126/76 132/71  Pulse: 71 71  Resp: 18 18   Temp: (!) 97.5 F (36.4 C)   SpO2: 92% 93%   No intake or output data in the 24 hours ending 05/14/23 1041  Filed Weights   04/24/23 0900 05/07/23 0522 05/08/23 0608  Weight: 54.6 kg 36.1 kg 39.4 kg   Weight change:  Body mass index is 14.02 kg/m.   Physical Exam: General exam: Pleasant, [name].  Not in distress Skin: No rashes, lesions or ulcers. HEENT: Atraumatic, normocephalic, no obvious bleeding Lungs: Clear to auscultation bilaterally,  CVS: S1, S2, no murmur,   GI/Abd: Soft, nontender, nondistended, bowel sound present,   CNS: Alert, awake, oriented to place only Psychiatry: Sad affect Extremities: No pedal edema, no calf tenderness,   Data Review: I have personally reviewed the laboratory data and studies available.  F/u labs  Unresulted Labs (From admission, onward)    None       Signed, Lorin Glass, MD [facility] 05/14/2023

## 2023-05-14 NOTE — Progress Notes (Signed)
 Physical Therapy Treatment Patient Details Name: Anthony Ball MRN: [id number] DOB: Jan 26, 1935 Today's Date: 05/14/2023   History of Present Illness Anthony Ball is a 89 y.o. male  patient to hospital with shortness of breath and productive cough; influenza a with pneumonia, partial SBO with history of COPD, dementia, hypothyroidism    PT Comments  Patient agreeable to PT session. He is cooperative during session and able to follow single step commands consistently. CGA required for hallway ambulation. Cues required to keep rolling walker closer to base of support for safety and for navigation back to room. Recommend to continue PT to maximize independence.    If plan is discharge home, recommend the following: A little help with walking and/or transfers;A little help with bathing/dressing/bathroom;Assistance with cooking/housework;Direct supervision/assist for financial management;Assist for transportation;Help with stairs or ramp for entrance;Direct supervision/assist for medications management;Supervision due to cognitive status   Can travel by private vehicle     Yes  Equipment Recommendations  Rolling walker (2 wheels);BSC/3in1    Recommendations for Other Services       Precautions / Restrictions Precautions Precautions: Fall Restrictions Weight Bearing Restrictions Per Provider Order: No     Mobility  Bed Mobility               General bed mobility comments: not assessed    Transfers Overall transfer level: Needs assistance Equipment used: Rolling walker (2 wheels) Transfers: Sit to/from Stand Sit to Stand: Supervision                Ambulation/Gait Ambulation/Gait assistance: Contact guard assist Gait Distance (Feet): 120 Feet Assistive device: Rolling walker (2 wheels) Gait Pattern/deviations: Step-through pattern, Trunk flexed Gait velocity: decreased     General Gait Details: patient walking with mobility tech in hallway at start of PT  session. he needs frequent cues to stand closer to rolling walker, espcially with turns. patient was unable to navigate back to the room without cues.   Stairs             Wheelchair Mobility     Tilt Bed    Modified Rankin (Stroke Patients Only)       Balance Overall balance assessment: Needs assistance Sitting-balance support: Feet supported Sitting balance-Leahy Scale: Good     Standing balance support: No upper extremity supported Standing balance-Leahy Scale: Fair                              Hotel manager: Impaired Factors Affecting Communication: Hearing impaired  Cognition Arousal: Alert Behavior During Therapy: Flat affect   PT - Cognitive impairments: No family/caregiver present to determine baseline, Orientation, Memory, Awareness, Attention, Initiation, Safety/Judgement, Sequencing, Problem solving                       PT - Cognition Comments: confused but cooperative. able to follow single step commands consistently Following commands: Impaired Following commands impaired: Follows one step commands with increased time, Follows multi-step commands with increased time (occasional repetition for multi step commands)    Cueing Cueing Techniques: Verbal cues  Exercises      General Comments        Pertinent Vitals/Pain Pain Assessment Pain Assessment: No/denies pain    Home Living                          Prior Function  PT Goals (current goals can now be found in the care plan section) Acute Rehab PT Goals Patient Stated Goal: keep working on getting stronger PT Goal Formulation: Patient unable to participate in goal setting Time For Goal Achievement: 05/28/23 Potential to Achieve Goals: Fair Progress towards PT goals: Progressing toward goals (care plan extended)    Frequency    Min 1X/week      PT Plan      Co-evaluation              AM-PAC PT "6  Clicks" Mobility   Outcome Measure  Help needed turning from your back to your side while in a flat bed without using bedrails?: None Help needed moving from lying on your back to sitting on the side of a flat bed without using bedrails?: None Help needed moving to and from a bed to a chair (including a wheelchair)?: A Little Help needed standing up from a chair using your arms (e.g., wheelchair or bedside chair)?: A Little Help needed to walk in hospital room?: A Little Help needed climbing 3-5 steps with a railing? : A Little 6 Click Score: 20    End of Session   Activity Tolerance: Patient tolerated treatment well Patient left: in chair;with call bell/phone within reach;with chair alarm set (lap belt on (patient demonstrated how to take the belt off properly))   PT Visit Diagnosis: Unsteadiness on feet (R26.81);Other abnormalities of gait and mobility (R26.89);Muscle weakness (generalized) (M62.81)     Time: 1610-9604 PT Time Calculation (min) (ACUTE ONLY): 22 min  Charges:    $Gait Training: 8-22 mins PT General Charges $$ ACUTE PT VISIT: 1 Visit                     Donna Bernard, PT, MPT    Ina Homes 05/14/2023, 2:47 PM

## 2023-05-15 NOTE — Plan of Care (Signed)

## 2023-05-15 NOTE — TOC Transition Note (Signed)
 Transition of Care [facility]) - Discharge Note   Patient Details  Name: Anthony Ball MRN: [id number] Date of Birth: Sep 22, 1934  [facility] CM/SW Contact:  Dionel Archey A [place], LCSW Phone Number: 05/15/2023, 4:28 PM   Clinical Narrative:     Patient will DC to: [facility]  Anticipated DC date: 05/15/23  Family notified: Lonn [place]  Transport by: [facility] private vehicle      Per MD patient ready for DC to [facility]. RN, patient, patient's family, and facility notified of DC. Discharge Summary and FL2 sent to facility. RN to call report prior to discharge ( [phone]). DC packet on chart. Ambulance transport requested for patient.     CSW will sign off for now as social work intervention is no longer needed. Please consult [place] again if new needs arise.   Final next level of care: Memory Care Barriers to Discharge: Barriers Resolved   Patient Goals and CMS Choice Patient states their goals for this hospitalization and ongoing recovery are:: get better          Discharge Placement              Patient chooses bed at:  [facility]) Patient to be transferred to facility by: Facility private vehicle transport Name of family member notified: Lonn [place] Patient and family notified of of transfer: 05/15/23  Discharge Plan and Services Additional resources added to the After Visit Summary for   In-house Referral: Clinical Social Work                                   Social Drivers of Health (SDOH) Interventions SDOH Screenings   Food Insecurity: No Food Insecurity (04/13/2023)  Housing: Low Risk  (04/13/2023)  Transportation Needs: No Transportation Needs (04/13/2023)  Utilities: Not At Risk (04/13/2023)  Social Connections: Unknown (04/13/2023)  Tobacco Use: Unknown (04/13/2023)     Readmission Risk Interventions     No data to display

## 2023-05-15 NOTE — Plan of Care (Signed)
  Problem: Education: Goal: Knowledge of General Education information will improve Description: Including pain rating scale, medication(s)/side effects and non-pharmacologic comfort measures 05/15/2023 0719 by Anden Bartolo, RN Outcome: Progressing 05/15/2023 0718 by Arlys Scatena, RN Outcome: Progressing   Problem: Health Behavior/Discharge Planning: Goal: Ability to manage health-related needs will improve 05/15/2023 0719 by Omair Dettmer, RN Outcome: Progressing 05/15/2023 0718 by Cleotha Whalin, RN Outcome: Progressing   Problem: Clinical Measurements: Goal: Ability to maintain clinical measurements within normal limits will improve 05/15/2023 0719 by Suhan Paci, RN Outcome: Progressing 05/15/2023 0718 by Ryheem Jay, RN Outcome: Progressing Goal: Will remain free from infection 05/15/2023 0719 by Hubbert Landrigan, RN Outcome: Progressing 05/15/2023 0718 by Rhylin Venters, RN Outcome: Progressing Goal: Diagnostic test results will improve 05/15/2023 0719 by Lajuan Kovaleski, RN Outcome: Progressing 05/15/2023 0718 by Arjen Deringer, RN Outcome: Progressing Goal: Respiratory complications will improve 05/15/2023 0719 by Zeppelin Commisso, RN Outcome: Progressing 05/15/2023 0718 by Mazen Marcin, RN Outcome: Progressing Goal: Cardiovascular complication will be avoided 05/15/2023 0719 by Becky Colan, RN Outcome: Progressing 05/15/2023 0718 by Anaiyah Anglemyer, RN Outcome: Progressing   Problem: Activity: Goal: Risk for activity intolerance will decrease 05/15/2023 0719 by Anjeli Casad, RN Outcome: Progressing 05/15/2023 0718 by Geneve Kimpel, RN Outcome: Progressing   Problem: Nutrition: Goal: Adequate nutrition will be maintained 05/15/2023 0719 by Freedom Lopezperez, RN Outcome: Progressing 05/15/2023 0718 by Bronda Alfred, RN Outcome: Progressing   Problem: Coping: Goal: Level of anxiety will decrease 05/15/2023 0719 by Amai Cappiello, RN Outcome: Progressing 05/15/2023 0718 by Abdalla Naramore, RN Outcome: Progressing   Problem:  Elimination: Goal: Will not experience complications related to bowel motility 05/15/2023 0719 by Otila Starn, RN Outcome: Progressing 05/15/2023 0718 by Suhaylah Wampole, RN Outcome: Progressing Goal: Will not experience complications related to urinary retention 05/15/2023 0719 by Jakai Risse, RN Outcome: Progressing 05/15/2023 0718 by Larron Armor, RN Outcome: Progressing   Problem: Pain Managment: Goal: General experience of comfort will improve and/or be controlled 05/15/2023 0719 by Nannie Starzyk, RN Outcome: Progressing 05/15/2023 0718 by Hailly Fess, RN Outcome: Progressing   Problem: Safety: Goal: Ability to remain free from injury will improve 05/15/2023 0719 by Aneyah Lortz, RN Outcome: Progressing 05/15/2023 0718 by Lydie Stammen, RN Outcome: Progressing   Problem: Skin Integrity: Goal: Risk for impaired skin integrity will decrease 05/15/2023 0719 by Lorilee Cafarella, RN Outcome: Progressing 05/15/2023 0718 by Starla Deller, RN Outcome: Progressing   Problem: Activity: Goal: Ability to tolerate increased activity will improve 05/15/2023 0719 by Lyam Provencio, RN Outcome: Progressing 05/15/2023 0718 by Usama Harkless, RN Outcome: Progressing   Problem: Clinical Measurements: Goal: Ability to maintain a body temperature in the normal range will improve 05/15/2023 0719 by Marsel Gail, RN Outcome: Progressing 05/15/2023 0718 by Ashlye Oviedo, RN Outcome: Progressing   Problem: Respiratory: Goal: Ability to maintain adequate ventilation will improve 05/15/2023 0719 by Olinda Nola, RN Outcome: Progressing 05/15/2023 0718 by Solaris Kram, RN Outcome: Progressing Goal: Ability to maintain a clear airway will improve 05/15/2023 0719 by Jermie Hippe, RN Outcome: Progressing 05/15/2023 0718 by Jalyric Kaestner, RN Outcome: Progressing

## 2023-05-16 DIAGNOSIS — K2100 Gastro-esophageal reflux disease with esophagitis, without bleeding: Secondary | ICD-10-CM | POA: Diagnosis not present

## 2023-05-16 DIAGNOSIS — R1310 Dysphagia, unspecified: Secondary | ICD-10-CM | POA: Diagnosis not present

## 2023-05-16 DIAGNOSIS — M5416 Radiculopathy, lumbar region: Secondary | ICD-10-CM | POA: Diagnosis not present

## 2023-05-16 DIAGNOSIS — N200 Calculus of kidney: Secondary | ICD-10-CM | POA: Diagnosis not present

## 2023-05-16 DIAGNOSIS — E039 Hypothyroidism, unspecified: Secondary | ICD-10-CM | POA: Diagnosis not present

## 2023-05-16 DIAGNOSIS — E46 Unspecified protein-calorie malnutrition: Secondary | ICD-10-CM | POA: Diagnosis not present

## 2023-05-16 DIAGNOSIS — J4489 Other specified chronic obstructive pulmonary disease: Secondary | ICD-10-CM | POA: Diagnosis not present

## 2023-05-16 DIAGNOSIS — Z993 Dependence on wheelchair: Secondary | ICD-10-CM | POA: Diagnosis not present

## 2023-05-16 DIAGNOSIS — M47896 Other spondylosis, lumbar region: Secondary | ICD-10-CM | POA: Diagnosis not present

## 2023-05-18 DIAGNOSIS — Z79899 Other long term (current) drug therapy: Secondary | ICD-10-CM | POA: Diagnosis not present

## 2023-05-18 DIAGNOSIS — E034 Atrophy of thyroid (acquired): Secondary | ICD-10-CM | POA: Diagnosis not present

## 2023-05-22 DIAGNOSIS — E039 Hypothyroidism, unspecified: Secondary | ICD-10-CM | POA: Diagnosis not present

## 2023-05-22 DIAGNOSIS — K2100 Gastro-esophageal reflux disease with esophagitis, without bleeding: Secondary | ICD-10-CM | POA: Diagnosis not present

## 2023-05-22 DIAGNOSIS — J4489 Other specified chronic obstructive pulmonary disease: Secondary | ICD-10-CM | POA: Diagnosis not present

## 2023-05-30 ENCOUNTER — Ambulatory Visit: Payer: Medicare Other | Admitting: Urology

## 2023-06-06 DIAGNOSIS — H05229 Edema of unspecified orbit: Secondary | ICD-10-CM | POA: Diagnosis not present

## 2023-06-06 DIAGNOSIS — R2243 Localized swelling, mass and lump, lower limb, bilateral: Secondary | ICD-10-CM | POA: Diagnosis not present

## 2023-06-06 DIAGNOSIS — E034 Atrophy of thyroid (acquired): Secondary | ICD-10-CM | POA: Diagnosis not present

## 2023-06-06 DIAGNOSIS — J4489 Other specified chronic obstructive pulmonary disease: Secondary | ICD-10-CM | POA: Diagnosis not present

## 2023-06-06 DIAGNOSIS — Z79899 Other long term (current) drug therapy: Secondary | ICD-10-CM | POA: Diagnosis not present

## 2023-06-08 DIAGNOSIS — Z79899 Other long term (current) drug therapy: Secondary | ICD-10-CM | POA: Diagnosis not present

## 2023-06-13 DIAGNOSIS — M47896 Other spondylosis, lumbar region: Secondary | ICD-10-CM | POA: Diagnosis not present

## 2023-06-13 DIAGNOSIS — Z0001 Encounter for general adult medical examination with abnormal findings: Secondary | ICD-10-CM | POA: Diagnosis not present

## 2023-06-13 DIAGNOSIS — S81811D Laceration without foreign body, right lower leg, subsequent encounter: Secondary | ICD-10-CM | POA: Diagnosis not present

## 2023-06-18 DIAGNOSIS — Z79899 Other long term (current) drug therapy: Secondary | ICD-10-CM | POA: Diagnosis not present

## 2023-06-18 DIAGNOSIS — E039 Hypothyroidism, unspecified: Secondary | ICD-10-CM | POA: Diagnosis not present

## 2023-06-22 DIAGNOSIS — M19072 Primary osteoarthritis, left ankle and foot: Secondary | ICD-10-CM | POA: Diagnosis not present

## 2023-06-22 DIAGNOSIS — B351 Tinea unguium: Secondary | ICD-10-CM | POA: Diagnosis not present

## 2023-06-22 DIAGNOSIS — E034 Atrophy of thyroid (acquired): Secondary | ICD-10-CM | POA: Diagnosis not present

## 2023-06-22 DIAGNOSIS — M19071 Primary osteoarthritis, right ankle and foot: Secondary | ICD-10-CM | POA: Diagnosis not present

## 2023-06-22 DIAGNOSIS — I872 Venous insufficiency (chronic) (peripheral): Secondary | ICD-10-CM | POA: Diagnosis not present

## 2023-06-22 DIAGNOSIS — I739 Peripheral vascular disease, unspecified: Secondary | ICD-10-CM | POA: Diagnosis not present

## 2023-06-22 DIAGNOSIS — L84 Corns and callosities: Secondary | ICD-10-CM | POA: Diagnosis not present

## 2023-06-22 DIAGNOSIS — R234 Changes in skin texture: Secondary | ICD-10-CM | POA: Diagnosis not present

## 2023-06-25 DIAGNOSIS — R1310 Dysphagia, unspecified: Secondary | ICD-10-CM | POA: Diagnosis not present

## 2023-06-25 DIAGNOSIS — E039 Hypothyroidism, unspecified: Secondary | ICD-10-CM | POA: Diagnosis not present

## 2023-06-25 DIAGNOSIS — N200 Calculus of kidney: Secondary | ICD-10-CM | POA: Diagnosis not present

## 2023-06-25 DIAGNOSIS — M4726 Other spondylosis with radiculopathy, lumbar region: Secondary | ICD-10-CM | POA: Diagnosis not present

## 2023-06-25 DIAGNOSIS — Z604 Social exclusion and rejection: Secondary | ICD-10-CM | POA: Diagnosis not present

## 2023-06-25 DIAGNOSIS — Z556 Problems related to health literacy: Secondary | ICD-10-CM | POA: Diagnosis not present

## 2023-06-25 DIAGNOSIS — Z79899 Other long term (current) drug therapy: Secondary | ICD-10-CM | POA: Diagnosis not present

## 2023-06-25 DIAGNOSIS — J4489 Other specified chronic obstructive pulmonary disease: Secondary | ICD-10-CM | POA: Diagnosis not present

## 2023-06-25 DIAGNOSIS — E46 Unspecified protein-calorie malnutrition: Secondary | ICD-10-CM | POA: Diagnosis not present

## 2023-06-25 DIAGNOSIS — K2100 Gastro-esophageal reflux disease with esophagitis, without bleeding: Secondary | ICD-10-CM | POA: Diagnosis not present

## 2023-06-26 DIAGNOSIS — E039 Hypothyroidism, unspecified: Secondary | ICD-10-CM | POA: Diagnosis not present

## 2023-06-27 DIAGNOSIS — J4489 Other specified chronic obstructive pulmonary disease: Secondary | ICD-10-CM | POA: Diagnosis not present

## 2023-06-27 DIAGNOSIS — R2243 Localized swelling, mass and lump, lower limb, bilateral: Secondary | ICD-10-CM | POA: Diagnosis not present

## 2023-06-27 DIAGNOSIS — S81811D Laceration without foreign body, right lower leg, subsequent encounter: Secondary | ICD-10-CM | POA: Diagnosis not present

## 2023-06-27 DIAGNOSIS — I7389 Other specified peripheral vascular diseases: Secondary | ICD-10-CM | POA: Diagnosis not present

## 2023-06-27 DIAGNOSIS — Z993 Dependence on wheelchair: Secondary | ICD-10-CM | POA: Diagnosis not present

## 2023-06-28 DIAGNOSIS — Z556 Problems related to health literacy: Secondary | ICD-10-CM | POA: Diagnosis not present

## 2023-06-28 DIAGNOSIS — E46 Unspecified protein-calorie malnutrition: Secondary | ICD-10-CM | POA: Diagnosis not present

## 2023-06-28 DIAGNOSIS — E039 Hypothyroidism, unspecified: Secondary | ICD-10-CM | POA: Diagnosis not present

## 2023-06-28 DIAGNOSIS — R1310 Dysphagia, unspecified: Secondary | ICD-10-CM | POA: Diagnosis not present

## 2023-06-28 DIAGNOSIS — M4726 Other spondylosis with radiculopathy, lumbar region: Secondary | ICD-10-CM | POA: Diagnosis not present

## 2023-06-28 DIAGNOSIS — N200 Calculus of kidney: Secondary | ICD-10-CM | POA: Diagnosis not present

## 2023-06-28 DIAGNOSIS — K2100 Gastro-esophageal reflux disease with esophagitis, without bleeding: Secondary | ICD-10-CM | POA: Diagnosis not present

## 2023-06-28 DIAGNOSIS — J4489 Other specified chronic obstructive pulmonary disease: Secondary | ICD-10-CM | POA: Diagnosis not present

## 2023-06-28 DIAGNOSIS — Z604 Social exclusion and rejection: Secondary | ICD-10-CM | POA: Diagnosis not present

## 2023-06-28 DIAGNOSIS — Z79899 Other long term (current) drug therapy: Secondary | ICD-10-CM | POA: Diagnosis not present

## 2023-07-02 DIAGNOSIS — N200 Calculus of kidney: Secondary | ICD-10-CM | POA: Diagnosis not present

## 2023-07-02 DIAGNOSIS — Z556 Problems related to health literacy: Secondary | ICD-10-CM | POA: Diagnosis not present

## 2023-07-02 DIAGNOSIS — E46 Unspecified protein-calorie malnutrition: Secondary | ICD-10-CM | POA: Diagnosis not present

## 2023-07-02 DIAGNOSIS — K2100 Gastro-esophageal reflux disease with esophagitis, without bleeding: Secondary | ICD-10-CM | POA: Diagnosis not present

## 2023-07-02 DIAGNOSIS — J4489 Other specified chronic obstructive pulmonary disease: Secondary | ICD-10-CM | POA: Diagnosis not present

## 2023-07-02 DIAGNOSIS — Z79899 Other long term (current) drug therapy: Secondary | ICD-10-CM | POA: Diagnosis not present

## 2023-07-02 DIAGNOSIS — Z604 Social exclusion and rejection: Secondary | ICD-10-CM | POA: Diagnosis not present

## 2023-07-02 DIAGNOSIS — M4726 Other spondylosis with radiculopathy, lumbar region: Secondary | ICD-10-CM | POA: Diagnosis not present

## 2023-07-02 DIAGNOSIS — E039 Hypothyroidism, unspecified: Secondary | ICD-10-CM | POA: Diagnosis not present

## 2023-07-02 DIAGNOSIS — R1310 Dysphagia, unspecified: Secondary | ICD-10-CM | POA: Diagnosis not present

## 2023-07-12 DIAGNOSIS — I10 Essential (primary) hypertension: Secondary | ICD-10-CM | POA: Diagnosis not present

## 2023-07-12 DIAGNOSIS — R600 Localized edema: Secondary | ICD-10-CM | POA: Diagnosis not present

## 2023-07-12 DIAGNOSIS — M4726 Other spondylosis with radiculopathy, lumbar region: Secondary | ICD-10-CM | POA: Diagnosis not present

## 2023-07-12 DIAGNOSIS — G301 Alzheimer's disease with late onset: Secondary | ICD-10-CM | POA: Diagnosis not present

## 2023-07-12 DIAGNOSIS — R1310 Dysphagia, unspecified: Secondary | ICD-10-CM | POA: Diagnosis not present

## 2023-07-12 DIAGNOSIS — I82413 Acute embolism and thrombosis of femoral vein, bilateral: Secondary | ICD-10-CM | POA: Diagnosis not present

## 2023-07-12 DIAGNOSIS — Z604 Social exclusion and rejection: Secondary | ICD-10-CM | POA: Diagnosis not present

## 2023-07-12 DIAGNOSIS — G309 Alzheimer's disease, unspecified: Secondary | ICD-10-CM | POA: Diagnosis not present

## 2023-07-12 DIAGNOSIS — J449 Chronic obstructive pulmonary disease, unspecified: Secondary | ICD-10-CM | POA: Diagnosis not present

## 2023-07-12 DIAGNOSIS — Z6823 Body mass index (BMI) 23.0-23.9, adult: Secondary | ICD-10-CM | POA: Diagnosis not present

## 2023-07-12 DIAGNOSIS — I2699 Other pulmonary embolism without acute cor pulmonale: Secondary | ICD-10-CM | POA: Diagnosis not present

## 2023-07-12 DIAGNOSIS — I2510 Atherosclerotic heart disease of native coronary artery without angina pectoris: Secondary | ICD-10-CM | POA: Diagnosis not present

## 2023-07-12 DIAGNOSIS — N200 Calculus of kidney: Secondary | ICD-10-CM | POA: Diagnosis not present

## 2023-07-12 DIAGNOSIS — Z556 Problems related to health literacy: Secondary | ICD-10-CM | POA: Diagnosis not present

## 2023-07-12 DIAGNOSIS — R1311 Dysphagia, oral phase: Secondary | ICD-10-CM | POA: Diagnosis not present

## 2023-07-12 DIAGNOSIS — E46 Unspecified protein-calorie malnutrition: Secondary | ICD-10-CM | POA: Diagnosis not present

## 2023-07-12 DIAGNOSIS — I083 Combined rheumatic disorders of mitral, aortic and tricuspid valves: Secondary | ICD-10-CM | POA: Diagnosis not present

## 2023-07-12 DIAGNOSIS — K219 Gastro-esophageal reflux disease without esophagitis: Secondary | ICD-10-CM | POA: Diagnosis not present

## 2023-07-12 DIAGNOSIS — I80203 Phlebitis and thrombophlebitis of unspecified deep vessels of lower extremities, bilateral: Secondary | ICD-10-CM | POA: Diagnosis not present

## 2023-07-12 DIAGNOSIS — E039 Hypothyroidism, unspecified: Secondary | ICD-10-CM | POA: Diagnosis not present

## 2023-07-12 DIAGNOSIS — Z79899 Other long term (current) drug therapy: Secondary | ICD-10-CM | POA: Diagnosis not present

## 2023-07-12 DIAGNOSIS — Z87442 Personal history of urinary calculi: Secondary | ICD-10-CM | POA: Diagnosis not present

## 2023-07-12 DIAGNOSIS — J4489 Other specified chronic obstructive pulmonary disease: Secondary | ICD-10-CM | POA: Diagnosis not present

## 2023-07-12 DIAGNOSIS — I82433 Acute embolism and thrombosis of popliteal vein, bilateral: Secondary | ICD-10-CM | POA: Diagnosis not present

## 2023-07-12 DIAGNOSIS — K2100 Gastro-esophageal reflux disease with esophagitis, without bleeding: Secondary | ICD-10-CM | POA: Diagnosis not present

## 2023-07-25 DIAGNOSIS — E46 Unspecified protein-calorie malnutrition: Secondary | ICD-10-CM | POA: Diagnosis not present

## 2023-07-25 DIAGNOSIS — E034 Atrophy of thyroid (acquired): Secondary | ICD-10-CM | POA: Diagnosis not present

## 2023-07-25 DIAGNOSIS — I2699 Other pulmonary embolism without acute cor pulmonale: Secondary | ICD-10-CM | POA: Diagnosis not present

## 2023-07-25 DIAGNOSIS — J4489 Other specified chronic obstructive pulmonary disease: Secondary | ICD-10-CM | POA: Diagnosis not present

## 2023-07-25 DIAGNOSIS — R296 Repeated falls: Secondary | ICD-10-CM | POA: Diagnosis not present

## 2023-07-25 DIAGNOSIS — I82403 Acute embolism and thrombosis of unspecified deep veins of lower extremity, bilateral: Secondary | ICD-10-CM | POA: Diagnosis not present

## 2023-07-26 DIAGNOSIS — R059 Cough, unspecified: Secondary | ICD-10-CM | POA: Diagnosis not present

## 2023-07-26 DIAGNOSIS — J811 Chronic pulmonary edema: Secondary | ICD-10-CM | POA: Diagnosis not present
